# Patient Record
Sex: Female | Born: 1960 | Race: Black or African American | Hispanic: No | Marital: Married | Smoking: Never smoker
Health system: Southern US, Community
[De-identification: ages and names within clinical notes are randomized; demographics above are authoritative.]

## PROBLEM LIST (undated history)

## (undated) DIAGNOSIS — R7309 Other abnormal glucose: Secondary | ICD-10-CM

## (undated) DIAGNOSIS — Z923 Personal history of irradiation: Secondary | ICD-10-CM

## (undated) DIAGNOSIS — E669 Obesity, unspecified: Secondary | ICD-10-CM

## (undated) DIAGNOSIS — J189 Pneumonia, unspecified organism: Secondary | ICD-10-CM

## (undated) DIAGNOSIS — R7303 Prediabetes: Secondary | ICD-10-CM

## (undated) DIAGNOSIS — Z9221 Personal history of antineoplastic chemotherapy: Secondary | ICD-10-CM

## (undated) DIAGNOSIS — K219 Gastro-esophageal reflux disease without esophagitis: Secondary | ICD-10-CM

## (undated) HISTORY — DX: Gastro-esophageal reflux disease without esophagitis: K21.9

## (undated) HISTORY — DX: Obesity, unspecified: E66.9

## (undated) HISTORY — DX: Other abnormal glucose: R73.09

---

## 1997-08-30 ENCOUNTER — Other Ambulatory Visit: Admission: RE | Admit: 1997-08-30 | Discharge: 1997-08-30 | Payer: Self-pay | Admitting: Obstetrics and Gynecology

## 1998-06-12 ENCOUNTER — Encounter: Payer: Self-pay | Admitting: Internal Medicine

## 1998-06-12 ENCOUNTER — Ambulatory Visit (HOSPITAL_COMMUNITY): Admission: RE | Admit: 1998-06-12 | Discharge: 1998-06-12 | Payer: Self-pay | Admitting: Internal Medicine

## 1998-07-26 ENCOUNTER — Other Ambulatory Visit: Admission: RE | Admit: 1998-07-26 | Discharge: 1998-07-26 | Payer: Self-pay | Admitting: Obstetrics and Gynecology

## 1999-08-07 ENCOUNTER — Other Ambulatory Visit: Admission: RE | Admit: 1999-08-07 | Discharge: 1999-08-07 | Payer: Self-pay | Admitting: Obstetrics and Gynecology

## 2000-08-29 ENCOUNTER — Other Ambulatory Visit: Admission: RE | Admit: 2000-08-29 | Discharge: 2000-08-29 | Payer: Self-pay | Admitting: Obstetrics and Gynecology

## 2001-08-20 ENCOUNTER — Encounter: Admission: RE | Admit: 2001-08-20 | Discharge: 2001-08-20 | Payer: Self-pay | Admitting: Obstetrics and Gynecology

## 2001-08-20 ENCOUNTER — Encounter: Payer: Self-pay | Admitting: Obstetrics and Gynecology

## 2002-04-27 ENCOUNTER — Encounter: Admission: RE | Admit: 2002-04-27 | Discharge: 2002-04-27 | Payer: Self-pay | Admitting: Obstetrics and Gynecology

## 2002-04-27 ENCOUNTER — Encounter: Payer: Self-pay | Admitting: Obstetrics and Gynecology

## 2002-08-27 ENCOUNTER — Encounter (INDEPENDENT_AMBULATORY_CARE_PROVIDER_SITE_OTHER): Payer: Self-pay | Admitting: *Deleted

## 2002-08-27 ENCOUNTER — Ambulatory Visit (HOSPITAL_COMMUNITY): Admission: RE | Admit: 2002-08-27 | Discharge: 2002-08-27 | Payer: Self-pay | Admitting: General Surgery

## 2002-10-25 ENCOUNTER — Encounter: Admission: RE | Admit: 2002-10-25 | Discharge: 2002-10-25 | Payer: Self-pay | Admitting: Obstetrics and Gynecology

## 2002-10-25 ENCOUNTER — Encounter: Payer: Self-pay | Admitting: Obstetrics and Gynecology

## 2003-02-05 HISTORY — PX: LAPAROSCOPIC CHOLECYSTECTOMY: SUR755

## 2003-12-21 ENCOUNTER — Encounter: Admission: RE | Admit: 2003-12-21 | Discharge: 2003-12-21 | Payer: Self-pay | Admitting: Obstetrics and Gynecology

## 2004-04-10 ENCOUNTER — Ambulatory Visit: Payer: Self-pay | Admitting: Endocrinology

## 2004-04-26 ENCOUNTER — Ambulatory Visit: Payer: Self-pay | Admitting: Internal Medicine

## 2005-01-01 ENCOUNTER — Encounter: Admission: RE | Admit: 2005-01-01 | Discharge: 2005-01-01 | Payer: Self-pay | Admitting: Obstetrics and Gynecology

## 2005-06-19 ENCOUNTER — Encounter: Admission: RE | Admit: 2005-06-19 | Discharge: 2005-06-19 | Payer: Self-pay | Admitting: Obstetrics and Gynecology

## 2005-07-13 ENCOUNTER — Emergency Department (HOSPITAL_COMMUNITY): Admission: EM | Admit: 2005-07-13 | Discharge: 2005-07-13 | Payer: Self-pay | Admitting: Family Medicine

## 2005-12-09 ENCOUNTER — Emergency Department (HOSPITAL_COMMUNITY): Admission: EM | Admit: 2005-12-09 | Discharge: 2005-12-09 | Payer: Self-pay | Admitting: Family Medicine

## 2006-01-03 ENCOUNTER — Encounter: Admission: RE | Admit: 2006-01-03 | Discharge: 2006-01-03 | Payer: Self-pay | Admitting: Obstetrics and Gynecology

## 2007-01-09 ENCOUNTER — Encounter: Admission: RE | Admit: 2007-01-09 | Discharge: 2007-01-09 | Payer: Self-pay | Admitting: Obstetrics and Gynecology

## 2008-01-11 ENCOUNTER — Encounter: Admission: RE | Admit: 2008-01-11 | Discharge: 2008-01-11 | Payer: Self-pay | Admitting: Obstetrics and Gynecology

## 2009-01-13 ENCOUNTER — Encounter: Admission: RE | Admit: 2009-01-13 | Discharge: 2009-01-13 | Payer: Self-pay | Admitting: Obstetrics and Gynecology

## 2009-01-20 ENCOUNTER — Encounter: Admission: RE | Admit: 2009-01-20 | Discharge: 2009-01-20 | Payer: Self-pay | Admitting: Obstetrics and Gynecology

## 2009-01-24 LAB — CONVERTED CEMR LAB: Pap Smear: NEGATIVE

## 2009-04-18 ENCOUNTER — Ambulatory Visit: Payer: Self-pay | Admitting: Internal Medicine

## 2009-04-18 DIAGNOSIS — Z8679 Personal history of other diseases of the circulatory system: Secondary | ICD-10-CM | POA: Insufficient documentation

## 2009-04-18 DIAGNOSIS — E669 Obesity, unspecified: Secondary | ICD-10-CM | POA: Insufficient documentation

## 2009-04-18 LAB — CONVERTED CEMR LAB
ALT: 23 units/L (ref 0–35)
AST: 23 units/L (ref 0–37)
BUN: 8 mg/dL (ref 6–23)
Basophils Relative: 0.6 % (ref 0.0–3.0)
Bilirubin Urine: NEGATIVE
Calcium: 9.2 mg/dL (ref 8.4–10.5)
Creatinine, Ser: 0.7 mg/dL (ref 0.4–1.2)
Eosinophils Absolute: 0.1 10*3/uL (ref 0.0–0.7)
Eosinophils Relative: 2 % (ref 0.0–5.0)
GFR calc non Af Amer: 115.16 mL/min (ref >60)
Glucose, Bld: 112 mg/dL — ABNORMAL HIGH (ref 70–99)
HDL: 46.8 mg/dL (ref >39.00)
Monocytes Relative: 4.4 % (ref 3.0–12.0)
Neutrophils Relative %: 73.6 % (ref 43.0–77.0)
Nitrite: NEGATIVE
Platelets: 280 10*3/uL (ref 150.0–400.0)
Sodium: 138 meq/L (ref 135–145)
TSH: 1.67 microintl units/mL (ref 0.35–5.50)
Total CHOL/HDL Ratio: 3
Urine Glucose: NEGATIVE mg/dL
Urobilinogen, UA: 0.2 (ref 0.0–1.0)

## 2010-01-15 ENCOUNTER — Encounter
Admission: RE | Admit: 2010-01-15 | Discharge: 2010-01-15 | Payer: Self-pay | Source: Home / Self Care | Attending: Obstetrics and Gynecology | Admitting: Obstetrics and Gynecology

## 2010-01-15 LAB — HM MAMMOGRAPHY: HM Mammogram: NORMAL

## 2010-01-30 ENCOUNTER — Encounter: Payer: Self-pay | Admitting: Internal Medicine

## 2010-03-08 NOTE — Assessment & Plan Note (Signed)
Summary: NEW PT,UHC,#,PKG-LB   Vital Signs:  Patient profile:   50 year old female Height:      66 inches Weight:      236.75 pounds BMI:     38.35 O2 Sat:      98 % on Room air Temp:     98.5 degrees F oral Pulse rate:   100 / minute BP sitting:   122 / 72  (right arm) Cuff size:   regular  Vitals Entered By: Margaret Pyle, CMA (April 18, 2009 8:24 AM)  O2 Flow:  Room air CC: new patient   Primary Care Provider:  Newt Lukes MD  CC:  new patient.  History of Present Illness: new pt to me and our practice (prev seen > 3 yrs ago by AVP) here to est care with PCP - also patient is here today for annual physical. Patient feels well and has no complaints.   Preventive Screening-Counseling & Management  Alcohol-Tobacco     Alcohol drinks/day: <1     Alcohol type: wine     Alcohol Counseling: not indicated; use of alcohol is not excessive or problematic     Smoking Status: never     Tobacco Counseling: not indicated; no tobacco use  Caffeine-Diet-Exercise     Does Patient Exercise: yes     Depression Counseling: not indicated; screening negative for depression  Safety-Violence-Falls     Seat Belt Use: yes     Helmet Use: yes     Firearms in the Home: no firearms in the home     Smoke Detectors: yes     Violence in the Home: no risk noted     Sexual Abuse: no     Fall Risk Counseling: not indicated; no significant falls noted  Clinical Review Panels:  Prevention   Last Mammogram:  No specific mammographic evidence of malignancy.   (01/24/2009)   Last Pap Smear:  Interpretation/Result:Negative for intraepithelial Lesion or Malignancy.    (01/24/2009)  Immunizations   Last Tetanus Booster:  Historical (02/05/2004)   Current Medications (verified): 1)  One-A-Day Womens Formula  Tabs (Multiple Vitamins-Calcium) .... Once Daily 2)  Omega-3 350 Mg Caps (Omega-3 Fatty Acids) .... Once Daily 3)  Vitamins A & D 5000-400 Unit Caps (Vitamins A & D)  .... Once Daily 4)  Vitamin C 500 Mg Tabs (Ascorbic Acid) .... 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 I have reviewed all other systems and they were negative.   Physical Exam  General:  overweight-appearing.  alert, well-developed, well-nourished, and cooperative to examination.    Head:  Normocephalic and atraumatic without obvious abnormalities. No apparent alopecia or balding. Eyes:  vision grossly intact; pupils equal, round and reactive to light.  conjunctiva and lids normal.    Ears:  normal pinnae bilaterally, without erythema, swelling, or tenderness to palpation. TMs clear, without effusion, or cerumen impaction. Hearing grossly normal bilaterally  Nose:  External nasal examination shows no deformity or inflammation. Nasal mucosa are pink and moist without lesions or exudates. Mouth:  teeth and gums in good repair; mucous membranes moist, without lesions or ulcers. oropharynx clear without exudate, erythema.  Neck:  supple, full ROM, no masses, no thyromegaly; no thyroid nodules or tenderness. no JVD or carotid bruits.   Lungs:  normal respiratory effort, no intercostal retractions or use of accessory muscles; normal breath sounds bilaterally - no crackles and no wheezes.    Heart:  normal rate, regular rhythm, no murmur, and no rub. BLE without edema. normal DP pulses and normal cap refill in  all 4 extremities    Abdomen:  soft, non-tender, normal bowel sounds, no distention; no masses and no appreciable hepatomegaly or splenomegaly.   Genitalia:  defer to gyn Msk:  No deformity or scoliosis noted of thoracic or lumbar spine.   Neurologic:  alert & oriented X3 and cranial nerves II-XII symetrically intact.  strength normal in all extremities, sensation intact to light touch, and gait normal. speech fluent without dysarthria or aphasia; follows commands with good comprehension.  Skin:  no rashes, vesicles, ulcers, or erythema. No nodules or irregularity to palpation.  Psych:  Oriented X3, memory intact for recent and remote, normally interactive, good eye contact, not anxious appearing, not depressed appearing, and not agitated.      Impression & Recommendations:  Problem # 1:  PREVENTIVE HEALTH CARE (ICD-V70.0)  Patient has been counseled on age-appropriate routine health concerns for screening and prevention. These are reviewed and up-to-date. Immunizations are up-to-date or declined. Labs ordered and ECG reviewed.   Orders: TLB-Lipid Panel (80061-LIPID) TLB-BMP (Basic Metabolic Panel-BMET) (80048-METABOL) TLB-CBC Platelet - w/Differential (85025-CBCD) TLB-Hepatic/Liver Function Pnl (80076-HEPATIC) TLB-TSH (Thyroid Stimulating Hormone) (84443-TSH) T-Vitamin D (25-Hydroxy) (57846-96295) TLB-Udip w/ Micro (81001-URINE) EKG w/ Interpretation (93000)  Problem # 2:  OBESITY (ICD-278.00) counseled on exercise, diet and weight mgmt Ht: 66 (04/18/2009)   Wt: 236.75 (04/18/2009)   BMI: 38.35 (04/18/2009)  Complete Medication List: 1)  One-a-day Womens Formula Tabs (Multiple vitamins-calcium) .... Once daily 2)  Omega-3 350 Mg Caps (Omega-3 fatty acids) .... Once daily 3)  Vitamins A & D 5000-400 Unit Caps (Vitamins a & d) .... Once daily 4)  Vitamin C 500 Mg Tabs (Ascorbic acid) .... Once daily  Patient Instructions: 1)  it was good to see you today. 2)  test(s) ordered today  - your results will be posted on the phone tree for review in 48-72 hours from the time of test completion; call [phone] and enter your 9 digit MRN (listed above on this page, just below your name); if any changes need to be made or there are abnormal results, you will be contacted directly.  3)  it is important that you continue to work on losing weight - monitor your diet and consume fewer calories such as less carbohydrates (sugar) and less fat. you also need to increase your physical activity level - start by walking for 10-20 minutes 3 times per week and work up to 30 minutes 4-5 times each week. aim for being short of breath but not out of breath when you work out - 4)  Please schedule a follow-up appointment annually for medical physical and labs, sooner if problems.    Pap Smear  Procedure date:  01/24/2009  Findings:      Interpretation/Result:Negative for intraepithelial Lesion or Malignancy.     Mammogram  Procedure date:  01/24/2009  Findings:  No specific mammographic evidence of malignancy.      Immunization History:  Tetanus/Td Immunization History:    Tetanus/Td:  historical (02/05/2004)

## 2010-04-17 ENCOUNTER — Other Ambulatory Visit: Payer: 59

## 2010-04-17 ENCOUNTER — Encounter (INDEPENDENT_AMBULATORY_CARE_PROVIDER_SITE_OTHER): Payer: Self-pay | Admitting: *Deleted

## 2010-04-17 ENCOUNTER — Other Ambulatory Visit: Payer: Self-pay | Admitting: Internal Medicine

## 2010-04-17 DIAGNOSIS — Z0000 Encounter for general adult medical examination without abnormal findings: Secondary | ICD-10-CM

## 2010-04-17 LAB — BASIC METABOLIC PANEL
BUN: 9 mg/dL (ref 6–23)
Chloride: 106 mEq/L (ref 96–112)
Creatinine, Ser: 0.7 mg/dL (ref 0.4–1.2)
Glucose, Bld: 111 mg/dL — ABNORMAL HIGH (ref 70–99)

## 2010-04-17 LAB — HEPATIC FUNCTION PANEL
ALT: 24 U/L (ref 0–35)
AST: 24 U/L (ref 0–37)
Albumin: 3.9 g/dL (ref 3.5–5.2)
Alkaline Phosphatase: 43 U/L (ref 39–117)
Bilirubin, Direct: 0.1 mg/dL (ref 0.0–0.3)
Total Protein: 7 g/dL (ref 6.0–8.3)

## 2010-04-17 LAB — URINALYSIS, ROUTINE W REFLEX MICROSCOPIC
Bilirubin Urine: NEGATIVE
Ketones, ur: NEGATIVE
Specific Gravity, Urine: 1.025 (ref 1.000–1.030)
Total Protein, Urine: NEGATIVE
Urine Glucose: NEGATIVE
pH: 6 (ref 5.0–8.0)

## 2010-04-17 LAB — CBC WITH DIFFERENTIAL/PLATELET
Eosinophils Absolute: 0.1 10*3/uL (ref 0.0–0.7)
Lymphocytes Relative: 28.9 % (ref 12.0–46.0)
MCV: 84.7 fl (ref 78.0–100.0)
Monocytes Relative: 5.5 % (ref 3.0–12.0)
Neutro Abs: 3.8 10*3/uL (ref 1.4–7.7)
Neutrophils Relative %: 63.3 % (ref 43.0–77.0)
Platelets: 289 10*3/uL (ref 150.0–400.0)
WBC: 6.1 10*3/uL (ref 4.5–10.5)

## 2010-04-17 LAB — LIPID PANEL
LDL Cholesterol: 74 mg/dL (ref 0–99)
Triglycerides: 68 mg/dL (ref 0.0–149.0)

## 2010-04-23 ENCOUNTER — Encounter: Payer: Self-pay | Admitting: Internal Medicine

## 2010-04-23 ENCOUNTER — Encounter (INDEPENDENT_AMBULATORY_CARE_PROVIDER_SITE_OTHER): Payer: 59 | Admitting: Internal Medicine

## 2010-04-23 DIAGNOSIS — R7309 Other abnormal glucose: Secondary | ICD-10-CM | POA: Insufficient documentation

## 2010-04-23 DIAGNOSIS — Z0000 Encounter for general adult medical examination without abnormal findings: Secondary | ICD-10-CM

## 2010-05-03 NOTE — Assessment & Plan Note (Signed)
Summary: cpx-lb   Vital Signs:  Patient profile:   50 year old female Weight:      240.4 pounds (109.27 kg) BMI:     38.94 O2 Sat:      98 % on Room air Temp:     98.6 degrees F (37.00 degrees C) oral Pulse rate:   99 / minute BP sitting:   118 / 82  (left arm) Cuff size:   large  Vitals Entered By: Orlan Leavens RMA (April 23, 2010 9:48 AM)  O2 Flow:  Room air CC: CPX Is Patient Diabetic? No Pain Assessment Patient in pain? no        Primary Care Provider:  Newt Lukes MD  CC:  CPX.  History of Present Illness:  patient is here today for annual physical. Patient feels well and has no complaints.   Preventive Screening-Counseling & Management  Alcohol-Tobacco     Alcohol drinks/day: <1     Alcohol type: wine     Alcohol Counseling: not indicated; use of alcohol is not excessive or problematic     Smoking Status: never     Tobacco Counseling: not indicated; no tobacco use  Caffeine-Diet-Exercise     Does Patient Exercise: yes     Depression Counseling: not indicated; screening negative for depression  Safety-Violence-Falls     Seat Belt Use: yes     Helmet Use: yes     Firearms in the Home: no firearms in the home     Smoke Detectors: yes     Violence in the Home: no risk noted     Sexual Abuse: no     Fall Risk Counseling: not indicated; no significant falls noted  Clinical Review Panels:  Prevention   Last Mammogram:  No specific mammographic evidence of malignancy.   (01/15/2010)   Last Pap Smear:  Interpretation Result:Negative for intraepithelial Lesion or Malignancy. See Dr. Thornell Mule    (01/30/2010)  Immunizations   Last Tetanus Booster:  Historical (02/05/2004)  Lipid Management   Cholesterol:  124 (04/17/2010)   LDL (bad choesterol):  74 (04/17/2010)   HDL (good cholesterol):  36.50 (04/17/2010)  CBC   WBC:  6.1 (04/17/2010)   RBC:  4.52 (04/17/2010)   Hgb:  13.1 (04/17/2010)   Hct:  38.3 (04/17/2010)   Platelets:  289.0  (04/17/2010)   MCV  84.7 (04/17/2010)   MCHC  34.2 (04/17/2010)   RDW  14.2 (04/17/2010)   PMN:  63.3 (04/17/2010)   Lymphs:  28.9 (04/17/2010)   Monos:  5.5 (04/17/2010)   Eosinophils:  2.1 (04/17/2010)   Basophil:  0.2 (04/17/2010)  Complete Metabolic Panel   Glucose:  111 (04/17/2010)   Sodium:  138 (04/17/2010)   Potassium:  4.6 (04/17/2010)   Chloride:  106 (04/17/2010)   CO2:  27 (04/17/2010)   BUN:  9 (04/17/2010)   Creatinine:  0.7 (04/17/2010)   Albumin:  3.9 (04/17/2010)   Total Protein:  7.0 (04/17/2010)   Calcium:  9.4 (04/17/2010)   Total Bili:  0.4 (04/17/2010)   Alk Phos:  43 (04/17/2010)   SGPT (ALT):  24 (04/17/2010)   SGOT (AST):  24 (04/17/2010)   Current Medications (verified): 1)  One-A-Day Womens Formula  Tabs (Multiple Vitamins-Calcium) .... Once Daily 2)  Omega-3 350 Mg Caps (Omega-3 Fatty Acids) .... Once Daily 3)  Vitamin C 500 Mg Tabs (Ascorbic Acid) .... Once Daily 4)  Fish Oil 1000 Mg Caps (Omega-3 Fatty Acids) .... Take 1 By Mouth Once Daily  5)  Vitamin D3 1000 Unit Tabs (Cholecalciferol) .... Take 1 By Mouth Once Daily 6)  Zyrtec-D Allergy & Congestion 5-120 Mg Xr12h-Tab (Cetirizine-Pseudoephedrine) .... 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 I have reviewed all other systems and they were negative.   Physical Exam  General:  overweight-appearing.  alert, well-developed, well-nourished, and cooperative to  examination.    Head:  Normocephalic and atraumatic without obvious abnormalities. No apparent alopecia or balding. Eyes:  vision grossly intact; pupils equal, round and reactive to light.  conjunctiva and lids normal.    Ears:  normal pinnae bilaterally, without erythema, swelling, or tenderness to palpation. TMs clear, without effusion, or cerumen impaction. Hearing grossly normal bilaterally  Nose:  External nasal examination shows no deformity or inflammation. Nasal mucosa are pink and moist without lesions or exudates. Mouth:  teeth and gums in good repair; mucous membranes moist, without lesions or ulcers. oropharynx clear without exudate, no erythema.  Neck:  supple, full ROM, no masses, no thyromegaly; no thyroid nodules or tenderness. no JVD or carotid bruits.   Lungs:  normal respiratory effort, no intercostal retractions or use of accessory muscles; normal breath sounds bilaterally - no crackles and no wheezes.    Heart:  normal rate, regular rhythm, no murmur, and no rub. BLE without edema. normal DP pulses and normal cap refill in all 4 extremities    Abdomen:  soft, non-tender, normal bowel sounds, no distention; no masses and no appreciable hepatomegaly or splenomegaly.   Genitalia:  defer to gyn Msk:  No deformity or scoliosis noted of thoracic or lumbar spine.   Neurologic:  alert & oriented X3 and cranial nerves II-XII symetrically intact.  strength normal in all extremities, sensation intact to light touch, and gait normal. speech fluent without dysarthria or aphasia; follows commands with good comprehension.  Skin:  no rashes, vesicles, ulcers, or erythema. No nodules or irregularity to palpation.  Psych:  Oriented X3, memory intact for recent and remote, normally interactive, good eye contact, not anxious appearing, not depressed appearing, and not agitated.      Impression & Recommendations:  Problem # 1:  PREVENTIVE HEALTH CARE (ICD-V70.0) Patient has been counseled on  age-appropriate routine health concerns for screening and prevention. These are reviewed and up-to-date. Immunizations are up-to-date or declined. Labs and ECG reviewed.  Orders: EKG w/ Interpretation (93000)  Problem # 2:  HYPERGLYCEMIA (ICD-790.29) borderline, no fh dm but inactive lifestyle, slow weight gain counsleed on risk DM and need to get active, healthy food choices - will watch yearly, check a1c if >120  Complete Medication List: 1)  One-a-day Womens Formula Tabs (Multiple vitamins-calcium) .... Once daily 2)  Omega-3 350 Mg Caps (Omega-3 fatty acids) .... Once daily 3)  Vitamin C 500 Mg Tabs (Ascorbic acid) .... Once daily 4)  Fish Oil 1000 Mg Caps (Omega-3 fatty acids) .... Take 1 by mouth once daily 5)  Vitamin D3 1000 Unit Tabs (Cholecalciferol) .... Take 1 by mouth  once daily 6)  Zyrtec-d Allergy & Congestion 5-120 Mg Xr12h-tab (Cetirizine-pseudoephedrine) .... Use as needed  Patient Instructions: 1)  it was good to see you today. 2)  land reviewed today - all look good but will watch suggar levels! 3)  it is important that you work on losing weight - monitor your diet and consume fewer calories such as less carbohydrates (sugar) and less fat. you also need to increase your physical activity level - start by walking for 20 minutes 3 times per week and work up to 30 minutes 4-5 times each week. aim for being short of breath but not out of breath when you work out - 4)  tyr claritin as needed for allergy symptoms  5)  Please schedule a follow-up appointment annually for medical physical and labs, sooner if problems.    Orders Added: 1)  EKG w/ Interpretation [93000] 2)  Est. Patient 40-64 years [99396]     Mammogram  Procedure date:  01/15/2010  Findings:      No specific mammographic evidence of malignancy.    Pap Smear  Procedure date:  01/30/2010  Findings:      Interpretation Result:Negative for intraepithelial Lesion or Malignancy. See Dr. Thornell Mule

## 2010-06-22 NOTE — Op Note (Signed)
NAME:  Isabel [name], Isabel [name]                         ACCOUNT NO.:  [id number]   MEDICAL RECORD NO.:  [id number]                   PATIENT TYPE:  OIB   LOCATION:  NA                                   FACILITY:  [facility]   PHYSICIAN:  Angelia Mould. Derrell Lolling, M.D.             DATE OF BIRTH:  [birth year]   DATE OF PROCEDURE:  08/27/2002  DATE OF DISCHARGE:                                 OPERATIVE REPORT   PREOPERATIVE DIAGNOSIS:  Chronic calculous cholecystitis with  cholelithiasis.   POSTOPERATIVE DIAGNOSIS:  Chronic calculous cholecystitis with  cholelithiasis.   OPERATION PERFORMED:  Laparoscopic cholecystectomy.   SURGEON:  Angelia Mould. Derrell Lolling, M.D.   ASSISTANT:  Currie Paris, M.D.   ANESTHESIA:  General.   INDICATIONS FOR PROCEDURE:  The patient is a 50 year old black female who  has had a several year history of intermittent episodes of postprandial  bloating.  She recently had an episode of upper abdominal pain after eating  a meal, which then resolved.  She may have had other episodes.  Ultrasound  was performed showing a solitary 17 mm gallstone and a normal common bile  duct.  Liver function tests were normal.  she is brought to the operating  room electively.   OPERATIVE FINDINGS:  The gallbladder is chronically inflamed and had fairly  extensive adhesions to it throughout the infundibulum, body and fundus.  These were chronic adhesions and were taken down atraumatically.  The  gallbladder contained one large and one small gallstone.  The cystic duct  was extremely tiny and could not be cannulated despite multiple attempts and  so a cholangiogram could not be done.  The anatomy of the cystic duct,  cystic artery and common bile duct appeared normal.  The liver, stomach,  duodenum, small intestine, large intestine, and omentum were normal to  inspection.   DESCRIPTION OF PROCEDURE:  Following the induction of general endotracheal  anesthesia, the patient's abdomen was  prepped and draped in a sterile  fashion.  0.5% Marcaine with epinephrine was used as a local infiltration  anesthetic.  A vertically oriented incision was made at the lower rim of the  umbilicus.  The fascia was incised in the midline and the abdominal cavity  entered under direct vision.  A 10 mm Hasson trocar was inserted and secured  with a pursestring suture of 0 Vicryl.  Pneumoperitoneum was created.  Video  camera was inserted with visualization and findings as described above.  A  10 mm trocar was placed in the subxiphoid region.  Two 5 mm trocars were  placed in the right upper quadrant.  The gallbladder fundus was identified  and retracted.  Fairly extensive lysis of adhesions was done with sharp  scissor dissection and electrocautery and we had good visualization and the  adhesiolysis went well.  We identified the infundibulum of the gallbladder  and retracted it laterally. We dissected out the cystic  duct and the cystic  artery.  We isolated the cystic artery as it went onto the wall of the  gallbladder, secured it with multiple metal clips and divided it.  We  created a very large window behind the cystic duct for a good critical view  and we could identify the junction of the cystic duct with the infundibulum  of the gallbladder.  The cystic duct was then secured with a metal clip  close to the gallbladder.  We opened the cystic duct and found that the  lumen was extremely tiny.  We had to open it in a couple of places and it  remained tiny. We ultimately completely transected the cystic duct and then  attempted to dilate and insert a cholangiogram catheter and this was  unsuccessful.  There was a little bit of clear bile coming back from the  cystic duct.  We decided to abandon attempts at cholangiography since her  liver function tests and ultrasound suggested a normal common bile duct.  We  then secured the cystic duct stump with multiple metal clips.  The  gallbladder was  dissected from its bed with electrocautery and removed  through the umbilical port.  Hemostasis was excellent and achieved with  electrocautery.  The operative field and subphrenic space was irrigated with  saline and the irrigation fluid was completely clear.  The lateral trocars  were removed.  There was a little bit of bleeding from one of the lateral  trocar sites and this was controlled with electrocautery and observed for a  few minutes. There was absolutely no bleeding after that.  The  pneumoperitoneum was released.  All of the trocars were removed.  The fascia  at the umbilicus was closed with 0 Vicryl sutures.  The skin incisions were  closed with subcuticular sutures of 4-0 Vicryl and Steri-Strips.  Clean  bandages were placed.  The patient was then transferred to the recovery room  in stable condition.  The estimated blood loss was about 10 to 15mL.  Complications were none.  Sponge, needle and instrument counts were correct.                                                Angelia Mould. Derrell Lolling, M.D.    HMI/MEDQ  D:  08/27/2002  T:  08/27/2002  Job:  [id number]   cc:   Georgina Quint. Plotnikov, M.D. [facility]

## 2010-09-26 ENCOUNTER — Other Ambulatory Visit: Payer: Self-pay | Admitting: Obstetrics and Gynecology

## 2010-09-26 DIAGNOSIS — Z1231 Encounter for screening mammogram for malignant neoplasm of breast: Secondary | ICD-10-CM

## 2011-01-17 ENCOUNTER — Other Ambulatory Visit: Payer: Self-pay | Admitting: Obstetrics and Gynecology

## 2011-01-17 ENCOUNTER — Ambulatory Visit
Admission: RE | Admit: 2011-01-17 | Discharge: 2011-01-17 | Disposition: A | Discharge status: Home / Self Care | Payer: 59 | Source: Ambulatory Visit | Attending: Obstetrics and Gynecology | Admitting: Obstetrics and Gynecology

## 2011-01-17 DIAGNOSIS — Z1231 Encounter for screening mammogram for malignant neoplasm of breast: Secondary | ICD-10-CM

## 2011-01-17 DIAGNOSIS — N6321 Unspecified lump in the left breast, upper outer quadrant: Secondary | ICD-10-CM

## 2011-02-01 ENCOUNTER — Ambulatory Visit
Admission: RE | Admit: 2011-02-01 | Discharge: 2011-02-01 | Disposition: A | Discharge status: Home / Self Care | Payer: 59 | Source: Ambulatory Visit | Attending: Obstetrics and Gynecology | Admitting: Obstetrics and Gynecology

## 2011-02-01 DIAGNOSIS — N6321 Unspecified lump in the left breast, upper outer quadrant: Secondary | ICD-10-CM

## 2011-02-26 ENCOUNTER — Encounter: Payer: Self-pay | Admitting: Internal Medicine

## 2011-04-26 ENCOUNTER — Encounter: Payer: 59 | Admitting: Internal Medicine

## 2011-05-13 ENCOUNTER — Encounter: Payer: Self-pay | Admitting: Internal Medicine

## 2011-06-04 ENCOUNTER — Telehealth: Payer: Self-pay | Admitting: *Deleted

## 2011-06-04 ENCOUNTER — Other Ambulatory Visit (INDEPENDENT_AMBULATORY_CARE_PROVIDER_SITE_OTHER): Payer: 59

## 2011-06-04 DIAGNOSIS — Z0000 Encounter for general adult medical examination without abnormal findings: Secondary | ICD-10-CM

## 2011-06-04 LAB — TSH: TSH: 2.24 u[IU]/mL (ref 0.35–5.50)

## 2011-06-04 LAB — URINALYSIS, ROUTINE W REFLEX MICROSCOPIC
Bilirubin Urine: NEGATIVE
Leukocytes, UA: NEGATIVE
Nitrite: NEGATIVE
Specific Gravity, Urine: 1.015 (ref 1.000–1.030)
Urobilinogen, UA: 0.2 (ref 0.0–1.0)
pH: 7 (ref 5.0–8.0)

## 2011-06-04 LAB — CBC WITH DIFFERENTIAL/PLATELET
Basophils Relative: 0.6 % (ref 0.0–3.0)
Eosinophils Absolute: 0.2 10*3/uL (ref 0.0–0.7)
Eosinophils Relative: 2.1 % (ref 0.0–5.0)
HCT: 39.7 % (ref 36.0–46.0)
Hemoglobin: 13.3 g/dL (ref 12.0–15.0)
MCHC: 33.5 g/dL (ref 30.0–36.0)
MCV: 84.3 fl (ref 78.0–100.0)
Monocytes Absolute: 0.7 10*3/uL (ref 0.1–1.0)
Neutro Abs: 4.7 10*3/uL (ref 1.4–7.7)
RBC: 4.71 Mil/uL (ref 3.87–5.11)

## 2011-06-04 LAB — HEPATIC FUNCTION PANEL
ALT: 20 U/L (ref 0–35)
Albumin: 3.9 g/dL (ref 3.5–5.2)
Total Bilirubin: 0.5 mg/dL (ref 0.3–1.2)

## 2011-06-04 LAB — BASIC METABOLIC PANEL
BUN: 11 mg/dL (ref 6–23)
Creatinine, Ser: 0.7 mg/dL (ref 0.4–1.2)
GFR: 114.13 mL/min (ref >60.00)
Glucose, Bld: 114 mg/dL — ABNORMAL HIGH (ref 70–99)

## 2011-06-04 LAB — LIPID PANEL: Cholesterol: 124 mg/dL (ref 0–200)

## 2011-06-04 NOTE — Telephone Encounter (Signed)
Received call from Isabel [name] in lab. Pt is schedule for cpx and needing labs in computer.Marland KitchenMarland Kitchen4/30/13@8 :35am/LMB

## 2011-06-10 ENCOUNTER — Ambulatory Visit (INDEPENDENT_AMBULATORY_CARE_PROVIDER_SITE_OTHER): Payer: 59 | Admitting: Internal Medicine

## 2011-06-10 ENCOUNTER — Encounter: Payer: Self-pay | Admitting: Internal Medicine

## 2011-06-10 VITALS — BP 130/72 | HR 98 | Temp 98.3°F | Ht 66.0 in | Wt 236.0 lb

## 2011-06-10 DIAGNOSIS — R7309 Other abnormal glucose: Secondary | ICD-10-CM

## 2011-06-10 DIAGNOSIS — Z0000 Encounter for general adult medical examination without abnormal findings: Secondary | ICD-10-CM

## 2011-06-10 NOTE — Assessment & Plan Note (Signed)
Never with "diabetes mellitus" but remains "borderline", no FH DM Advised on glycemic index and carb counting - will continue to work on weight reduction and aerobic exercise in addition to diet

## 2011-06-10 NOTE — Patient Instructions (Signed)
It was good to see you today. We have reviewed your interval medical history including labs and tests today - everything looks good! Keep up the good work! Health Maintenance reviewed - all recommended immunizations and age-appropriate screenings are up-to-date. Call [place] after your birthday for referral for colonoscopy screening Work on lifestyle changes as discussed (low fat, low carb, increased protein diet; improved exercise efforts; weight loss) to control sugar, blood pressure and cholesterol levels and/or reduce risk of developing other medical problems. Look into [email or web address]cy or other type of food journal to assist you in this process. Medications reviewed, no changes at this time. Please schedule followup in 1 year for medical physical and labs, call sooner if problems.   Basic Carbohydrate Counting Basic carbohydrate counting is a way to plan meals. It is done by counting the amount of carbohydrate in foods. Eating carbohydrates increases blood glucose (sugar) levels. People with diabetes use carbohydrate counting to help keep their blood glucose at a normal level.   Foods that have carbohydrates are starches (grains, beans, starchy vegetables) and sweets.   COUNTING CARBOHYDRATES IN FOODS The first step in counting carbohydrates is to learn how many carbohydrate servings you should have in every meal. A dietitian can plan this for you. After learning the amount of carbohydrates to include in your meal plan, you can start to choose the carbohydrate-containing foods you want to eat.   There are 2 ways to identify the amount of carbohydrates in the foods you eat.  Read the Nutrition Facts panel on food labels. All you need are 2 pieces of information from the Nutrition Facts panel to count carbohydrates this way:   Serving size.   Total carbohydrate (in grams).  Decide how many servings you will be eating. If it is 1 serving, you will be eating the amount of carbohydrate listed on the  panel. If you will be eating 2 servings, you will be eating double the amount of carbohydrate listed on the panel.    Learn serving sizes. A serving size of most carbohydrate-containing foods is about 15 g. Listed below are serving sizes of common carbohydrate-containing foods:   1 slice bread.    cup unsweetened, dry cereal.    cup hot cereal.   ? cup rice.    cup mashed potatoes.   ? cup pasta.   1 cup fresh fruit.    cup canned fruit.   1 cup milk (whole, 2%, or skim).    cup starchy vegetables (peas, corn, or potatoes).  Counting carbohydrates this way is similar to looking on the Nutrition Facts panel. Decide how many servings you will eat first. Multiply the number of servings you eat by 15 g. For example, if you have 2 cups of strawberries, you had 2 servings. That means you had 30 g of carbohydrate (2 servings x 15 g = 30 g). CALCULATING CARBOHYDRATES IN A MEAL Sample dinner  3 oz chicken breast.   ? cup brown rice.    cup corn.   1 cup fat-free milk.   1 cup strawberries with sugar-free whipped topping.  Carbohydrate calculation First, identify the foods that contain carbohydrate:  Rice.   Corn.   Milk.   Strawberries.  Calculate the number of servings eaten:  2 servings rice.   1 serving corn.   1 serving milk.   1 serving strawberries.  Multiply the number of servings by 15 g:  2 servings rice x 15 g = 30 g.   1 serving  corn x 15 g = 15 g.   1 serving milk x 15 g = 15 g.   1 serving strawberries x 15 g = 15 g.  Add the amounts to find the total carbohydrates eaten: 30 g + 15 g + 15 g + 15 g = 75 g carbohydrate eaten at dinner. Document Released: 01/21/2005 Document Revised: 01/10/2011 Document Reviewed: 12/07/2010 [facility] Patient Information 2012 [place], [place].

## 2011-06-10 NOTE — Progress Notes (Signed)
Subjective:    Patient ID: Isabel [name], female    DOB: Jan 31, 1961, 51 y.o.   MRN: [id number]  HPI patient is here today for annual physical. Patient feels well and has no complaints.  Past Medical History  Diagnosis Date  . HYPERGLYCEMIA   . OBESITY    Family History  Problem Relation Age of Onset  . Prostate cancer Father   . Hypertension Brother   . Kidney disease Brother   . Stroke Maternal Grandmother    History  Substance Use Topics  . Smoking status: Never Smoker   . Smokeless tobacco: Not on file   Comment: Married, lives with spouse (and occ grown son [employer]). works at [employer]  . Alcohol Use:      Review of Systems Constitutional: Negative for fever or weight change.  Respiratory: Negative for cough and shortness of breath.   Cardiovascular: Negative for chest pain or palpitations.  Gastrointestinal: Negative for abdominal pain, no bowel changes.  Musculoskeletal: Negative for gait problem or joint swelling.  Skin: Negative for rash.  Neurological: Negative for dizziness or headache.  No other specific complaints in a complete review of systems (except as listed in HPI above).     Objective:   Physical Exam BP 130/72  Pulse 98  Temp(Src) 98.3 F (36.8 C) (Oral)  Ht 5\' 6"  (1.676 m)  Wt 236 lb (107.049 kg)  BMI 38.09 kg/m2  SpO2 98% Wt Readings from Last 3 Encounters:  06/10/11 236 lb (107.049 kg)  04/23/10 240 lb 6.4 oz (109.045 kg)  04/18/09 236 lb 12 oz (107.389 kg)   Constitutional: She is overweight, but appears well-developed and well-nourished. No distress.  HENT: Head: Normocephalic and atraumatic. Ears: B TMs ok, no erythema or effusion; Nose: Nose normal. Mouth/Throat: Oropharynx is clear and moist. No oropharyngeal exudate.  Eyes: Conjunctivae and EOM are normal. Pupils are equal, round, and reactive to light. No scleral icterus.  Neck: Normal range of motion. Neck supple. No JVD present. No thyromegaly  present.  Cardiovascular: Normal rate, regular rhythm and normal heart sounds.  No murmur heard. No BLE edema. Pulmonary/Chest: Effort normal and breath sounds normal. No respiratory distress. She has no wheezes.  Abdominal: Soft. Bowel sounds are normal. She exhibits no distension. There is no tenderness. no masses Musculoskeletal: Normal range of motion, no joint effusions. No gross deformities Neurological: She is alert and oriented to person, place, and time. No cranial nerve deficit. Coordination normal.  Skin: Skin is warm and dry. No rash noted. No erythema.  Psychiatric: She has a normal mood and affect. Her behavior is normal. Judgment and thought content normal.   Lab Results  Component Value Date   WBC 7.6 06/04/2011   HGB 13.3 06/04/2011   HCT 39.7 06/04/2011   PLT 281.0 06/04/2011   GLUCOSE 114* 06/04/2011   CHOL 124 06/04/2011   TRIG 91.0 06/04/2011   HDL 41.60 06/04/2011   LDLCALC 64 06/04/2011   ALT 20 06/04/2011   AST 23 06/04/2011   NA 137 06/04/2011   K 4.3 06/04/2011   CL 105 06/04/2011   CREATININE 0.7 06/04/2011   BUN 11 06/04/2011   CO2 26 06/04/2011   TSH 2.24 06/04/2011   EKG: NSR @ 98 - no ischemic changes or arrhythmias      Assessment & Plan:  CPX/v70.0 - Patient has been counseled on age-appropriate routine health concerns for screening and prevention. These are reviewed and up-to-date. Immunizations are up-to-date or declined. Labs  and ECG reviewed.

## 2011-09-11 ENCOUNTER — Ambulatory Visit: Payer: 59

## 2011-09-11 ENCOUNTER — Ambulatory Visit (INDEPENDENT_AMBULATORY_CARE_PROVIDER_SITE_OTHER): Payer: 59 | Admitting: Internal Medicine

## 2011-09-11 VITALS — BP 132/78 | HR 97 | Temp 98.0°F | Resp 16 | Ht 65.5 in | Wt 240.2 lb

## 2011-09-11 DIAGNOSIS — R079 Chest pain, unspecified: Secondary | ICD-10-CM

## 2011-09-11 DIAGNOSIS — K2100 Gastro-esophageal reflux disease with esophagitis, without bleeding: Secondary | ICD-10-CM | POA: Insufficient documentation

## 2011-09-11 MED ORDER — OMEPRAZOLE 20 MG PO CPDR: 20.0000 mg | DELAYED_RELEASE_CAPSULE | Freq: Every day | ORAL | Status: DC

## 2011-09-11 MED ORDER — GI COCKTAIL ~~LOC~~
30.0000 mL | Freq: Once | ORAL | Status: AC
  Administered 2011-09-11: 30 mL via ORAL

## 2011-09-11 NOTE — Progress Notes (Signed)
  Subjective:    Patient ID: Isabel [name], female    DOB: Oct 31, 1960, 51 y.o.   MRN: [id number]  HPI Abdomen and upper chest discomfort Moderate in severity comes and goes Belching but feels hungry 2 days ago onset after eating pimento cheese and hotdogs. No choking No difficultly swallowing Pain is increased with deep breath and movement No nausea or vomiting No shortness of breath and no diaphoresis   Review of Systems  Constitutional: Negative.   HENT: Negative.   Eyes: Negative.   Respiratory: Negative.   Cardiovascular: Positive for chest pain.  Gastrointestinal: Negative.   Genitourinary: Negative.   Musculoskeletal: Negative.   Skin: Negative.   Neurological: Negative.   Hematological: Negative.   Psychiatric/Behavioral: Negative.   All other systems reviewed and are negative.       Objective:   Physical Exam  Nursing note and vitals reviewed. Constitutional: She is oriented to person, place, and time. She appears well-developed.  HENT:  Head: Normocephalic and atraumatic.  Right Ear: External ear normal.  Left Ear: External ear normal.  Mouth/Throat: Oropharynx is clear and moist.  Eyes: Conjunctivae and EOM are normal. Pupils are equal, round, and reactive to light.  Neck: Normal range of motion. Neck supple.  Cardiovascular: Normal rate, regular rhythm, normal heart sounds and intact distal pulses.   Pulmonary/Chest: Effort normal and breath sounds normal.  Abdominal: Soft. Bowel sounds are normal.  Musculoskeletal: Normal range of motion.  Neurological: She is alert and oriented to person, place, and time.  Skin: Skin is warm and dry.  Psychiatric: She has a normal mood and affect. Her behavior is normal. Judgment and thought content normal.      [facility] reading (PRIMARY) by  Dr. Mindi Junker negitive chest xray ekg nsr no acute changes interval qrs qt normal    Assessment & Plan:  chest pain Suspect esophagitis  Will eval ekg since pt has a hx of  hyperlipidemia Also will check chest xray

## 2011-09-11 NOTE — Patient Instructions (Addendum)
Take prilosec as directed. No alcohol no caffinated beverages. If your symptoms worsen return to the office or to the er.

## 2011-11-27 ENCOUNTER — Encounter: Payer: Self-pay | Admitting: Internal Medicine

## 2012-01-06 ENCOUNTER — Encounter: Payer: Self-pay | Admitting: Internal Medicine

## 2012-01-06 ENCOUNTER — Ambulatory Visit (AMBULATORY_SURGERY_CENTER): Payer: 59 | Admitting: *Deleted

## 2012-01-06 VITALS — Ht 66.5 in | Wt 245.0 lb

## 2012-01-06 DIAGNOSIS — Z1211 Encounter for screening for malignant neoplasm of colon: Secondary | ICD-10-CM

## 2012-01-06 MED ORDER — MOVIPREP 100 G PO SOLR: ORAL | Status: DC

## 2012-01-16 ENCOUNTER — Ambulatory Visit (AMBULATORY_SURGERY_CENTER): Payer: 59 | Admitting: Internal Medicine

## 2012-01-16 ENCOUNTER — Encounter: Payer: Self-pay | Admitting: Internal Medicine

## 2012-01-16 VITALS — BP 165/82 | HR 84 | Temp 97.8°F | Resp 21 | Ht 66.0 in | Wt 245.0 lb

## 2012-01-16 DIAGNOSIS — D126 Benign neoplasm of colon, unspecified: Secondary | ICD-10-CM

## 2012-01-16 DIAGNOSIS — Z1211 Encounter for screening for malignant neoplasm of colon: Secondary | ICD-10-CM

## 2012-01-16 DIAGNOSIS — K635 Polyp of colon: Secondary | ICD-10-CM

## 2012-01-16 MED ORDER — SODIUM CHLORIDE 0.9 % IV SOLN: 500.0000 mL | INTRAVENOUS | Status: DC

## 2012-01-16 NOTE — Op Note (Signed)
[facility] [address] [place], [address]   COLONOSCOPY PROCEDURE REPORT  PATIENT: [name], Isabel Mies  MR#: [id number] BIRTHDATE: 03-15-1960 , 51  yrs. old GENDER: Female ENDOSCOPIST: Beverley Fiedler, MD REFERRED XB:[id number], Valerie PROCEDURE DATE:  01/16/2012 PROCEDURE:   Colonoscopy with snare polypectomy ASA CLASS:   Class II INDICATIONS:average risk screening and first colonoscopy. MEDICATIONS: MAC sedation, administered by CRNA and propofol (Diprivan) 450mg  IV  DESCRIPTION OF PROCEDURE:   After the risks benefits and alternatives of the procedure were thoroughly explained, informed consent was obtained.  A digital rectal exam revealed no rectal mass.   The LB [id number]-[id number]  endoscope was introduced through the anus and advanced to the cecum, which was identified by both the appendix and ileocecal valve. No adverse events experienced. The quality of the prep was Moviprep fair  The instrument was then slowly withdrawn as the colon was fully examined.    COLON FINDINGS: Three sessile polyps measuring 2-5 mm in size were found in the distal sigmoid colon and rectum.  Polypectomy was performed using cold snare.  All resections were complete and all polyp tissue was completely retrieved.   The colon mucosa was otherwise normal.  Retroflexed views revealed no abnormalities. The time to cecum=12 minutes 02 seconds.  Withdrawal time=12 minutes 19 seconds.  The scope was withdrawn and the procedure completed. COMPLICATIONS: There were no complications.  ENDOSCOPIC IMPRESSION: 1.   Three sessile polyps measuring 2-5 mm in size were found in the distal sigmoid colon and rectum; Polypectomy was performed using cold snare 2.   The colon mucosa was otherwise normal  RECOMMENDATIONS: 1.  Await pathology results 2.  Timing of repeat colonoscopy will be determined by pathology findings. 3.  You will receive a letter within 1-2 weeks with the results of your  biopsy as well as final recommendations.  Please call my office if you have not received a letter after 3 weeks.   eSigned:  Beverley Fiedler, MD 01/16/2012 9:08 AM   cc: Newt Lukes, MD and The Patient

## 2012-01-16 NOTE — Progress Notes (Addendum)
Patient did not have preoperative order for IV antibiotic SSI prophylaxis. (G8918)  Patient did not experience any of the following events: a burn prior to discharge; a fall within the facility; wrong site/side/patient/procedure/implant event; or a hospital transfer or hospital admission upon discharge from the facility. (G8907)  

## 2012-01-16 NOTE — Patient Instructions (Addendum)
YOU HAD AN ENDOSCOPIC PROCEDURE TODAY AT THE [facility]: Refer to the procedure report that was given to you for any specific questions about what was found during the examination.  If the procedure report does not answer your questions, please call your gastroenterologist to clarify.  If you requested that your care partner not be given the details of your procedure findings, then the procedure report has been included in a sealed envelope for you to review at your convenience later.  YOU SHOULD EXPECT: Some feelings of bloating in the abdomen. Passage of more gas than usual.  Walking can help get rid of the air that was put into your GI tract during the procedure and reduce the bloating. If you had a lower endoscopy (such as a colonoscopy or flexible sigmoidoscopy) you may notice spotting of blood in your stool or on the toilet paper. If you underwent a bowel prep for your procedure, then you may not have a normal bowel movement for a few days.  DIET: Your first meal following the procedure should be a light meal and then it is ok to progress to your normal diet.  A half-sandwich or bowl of soup is an example of a good first meal.  Heavy or fried foods are harder to digest and may make you feel nauseous or bloated.  Likewise meals heavy in dairy and vegetables can cause extra gas to form and this can also increase the bloating.  Drink plenty of fluids but you should avoid alcoholic beverages for 24 hours.  ACTIVITY: Your care partner should take you home directly after the procedure.  You should plan to take it easy, moving slowly for the rest of the day.  You can resume normal activity the day after the procedure however you should NOT DRIVE or use heavy machinery for 24 hours (because of the sedation medicines used during the test).    SYMPTOMS TO REPORT IMMEDIATELY: A gastroenterologist can be reached at any hour.  During normal business hours, 8:30 AM to 5:00 PM Monday through Friday,  call [phone].  After hours and on weekends, please call the GI answering service at [phone] who will take a message and have the physician on call contact you.   Following lower endoscopy (colonoscopy or flexible sigmoidoscopy):  Excessive amounts of blood in the stool  Significant tenderness or worsening of abdominal pains  Swelling of the abdomen that is new, acute  Fever of 100F or higher  FOLLOW UP: If any biopsies were taken you will be contacted by phone or by letter within the next 1-3 weeks.  Call your gastroenterologist if you have not heard about the biopsies in 3 weeks.  Our staff will call the home number listed on your records the next business day following your procedure to check on you and address any questions or concerns that you may have at that time regarding the information given to you following your procedure. This is a courtesy call and so if there is no answer at the home number and we have not heard from you through the emergency physician on call, we will assume that you have returned to your regular daily activities without incident.  SIGNATURES/CONFIDENTIALITY: You and/or your care partner have signed paperwork which will be entered into your electronic medical record.  These signatures attest to the fact that that the information above on your After Visit Summary has been reviewed and is understood.  Full responsibility of the confidentiality of this   discharge information lies with you and/or your care-partner.   Thank-you for choosing us for your healthcare needs. 

## 2012-01-17 ENCOUNTER — Telehealth: Payer: Self-pay | Admitting: *Deleted

## 2012-01-17 NOTE — Telephone Encounter (Signed)
  Follow up Call-  Call back number 01/16/2012  Post procedure Call Back phone  # [phone]  Permission to leave phone message Yes     Patient questions:  Do you have a fever, pain , or abdominal swelling? no Pain Score  0 *  Have you tolerated food without any problems? yes  Have you been able to return to your normal activities? yes  Do you have any questions about your discharge instructions: Diet   no Medications  no Follow up visit  no  Do you have questions or concerns about your Care? no  Actions: * If pain score is 4 or above: No action needed, pain <4.

## 2012-01-22 ENCOUNTER — Encounter: Payer: Self-pay | Admitting: Internal Medicine

## 2012-02-10 ENCOUNTER — Telehealth: Payer: Self-pay | Admitting: *Deleted

## 2012-02-10 DIAGNOSIS — Z0000 Encounter for general adult medical examination without abnormal findings: Secondary | ICD-10-CM

## 2012-02-10 NOTE — Telephone Encounter (Signed)
Message copied by Deatra James on Mon Feb 10, 2012  9:50 AM ------      Message from: Etheleen Sia      Created: Mon Feb 10, 2012  9:39 AM      Regarding: LABS       PHYSICAL LABS FOR PHYSICAL Isabel [name]

## 2012-02-10 NOTE — Telephone Encounter (Signed)
Received staff msg needing cpx labs entered...lmb

## 2012-06-05 ENCOUNTER — Other Ambulatory Visit (INDEPENDENT_AMBULATORY_CARE_PROVIDER_SITE_OTHER): Payer: 59

## 2012-06-05 DIAGNOSIS — Z0000 Encounter for general adult medical examination without abnormal findings: Secondary | ICD-10-CM

## 2012-06-05 LAB — CBC WITH DIFFERENTIAL/PLATELET
Basophils Absolute: 0 10*3/uL (ref 0.0–0.1)
HCT: 41.1 % (ref 36.0–46.0)
Lymphs Abs: 1.8 10*3/uL (ref 0.7–4.0)
Monocytes Absolute: 0.6 10*3/uL (ref 0.1–1.0)
Monocytes Relative: 7.6 % (ref 3.0–12.0)
Platelets: 291 10*3/uL (ref 150.0–400.0)
RDW: 14 % (ref 11.5–14.6)

## 2012-06-05 LAB — URINALYSIS, ROUTINE W REFLEX MICROSCOPIC
Bilirubin Urine: NEGATIVE
Leukocytes, UA: NEGATIVE
Nitrite: NEGATIVE
Specific Gravity, Urine: 1.02 (ref 1.000–1.030)
pH: 7 (ref 5.0–8.0)

## 2012-06-05 LAB — TSH: TSH: 1.67 u[IU]/mL (ref 0.35–5.50)

## 2012-06-08 LAB — LIPID PANEL
Cholesterol: 146 mg/dL (ref 0–200)
HDL: 41.4 mg/dL
LDL Cholesterol: 86 mg/dL (ref 0–99)
Total CHOL/HDL Ratio: 4
Triglycerides: 95 mg/dL (ref 0.0–149.0)
VLDL: 19 mg/dL (ref 0.0–40.0)

## 2012-06-08 LAB — BASIC METABOLIC PANEL
GFR: 79.91 mL/min (ref >60.00)
Potassium: 4.1 mEq/L (ref 3.5–5.1)
Sodium: 135 mEq/L (ref 135–145)

## 2012-06-08 LAB — HEPATIC FUNCTION PANEL
AST: 30 U/L (ref 0–37)
Albumin: 4.9 g/dL (ref 3.5–5.2)
Alkaline Phosphatase: 50 U/L (ref 39–117)

## 2012-06-10 ENCOUNTER — Encounter: Payer: 59 | Admitting: Internal Medicine

## 2012-06-11 ENCOUNTER — Ambulatory Visit (INDEPENDENT_AMBULATORY_CARE_PROVIDER_SITE_OTHER): Payer: 59 | Admitting: Internal Medicine

## 2012-06-11 ENCOUNTER — Encounter: Payer: Self-pay | Admitting: Internal Medicine

## 2012-06-11 VITALS — BP 120/82 | HR 95 | Temp 98.6°F | Ht 65.5 in | Wt 242.0 lb

## 2012-06-11 DIAGNOSIS — R7309 Other abnormal glucose: Secondary | ICD-10-CM

## 2012-06-11 DIAGNOSIS — K219 Gastro-esophageal reflux disease without esophagitis: Secondary | ICD-10-CM

## 2012-06-11 DIAGNOSIS — E669 Obesity, unspecified: Secondary | ICD-10-CM

## 2012-06-11 DIAGNOSIS — Z0000 Encounter for general adult medical examination without abnormal findings: Secondary | ICD-10-CM

## 2012-06-11 NOTE — Assessment & Plan Note (Signed)
Wt Readings from Last 3 Encounters:  06/11/12 242 lb (109.77 kg)  01/16/12 245 lb (111.131 kg)  01/06/12 245 lb (111.131 kg)   Weight trends reviewed The patient is asked to make an attempt to improve diet and exercise patterns to aid in medical management of this problem. Return visit in 6 months to recheck weight and monitor A1c

## 2012-06-11 NOTE — Progress Notes (Signed)
Subjective:    Patient ID: Isabel [name], female    DOB: Nov 27, 1960, 52 y.o.   MRN: [id number]  HPI  Patient presents to the office today for a CPE.  Patient states that she is doing well overall.  Interval history and Family History were reviewed.  Past Medical History  Diagnosis Date  . HYPERGLYCEMIA   . OBESITY   . GERD (gastroesophageal reflux disease)       Family History  Problem Relation Age of Onset  . Prostate cancer Father   . Hypertension Brother   . Kidney disease Brother   . Stroke Maternal Grandmother   . Colon cancer Neg Hx   . Stomach cancer Neg Hx      Review of Systems  Constitutional: Negative for fever, chills and fatigue.  Respiratory: Negative for chest tightness and shortness of breath.   Cardiovascular: Negative for chest pain and palpitations.  Gastrointestinal: Negative for nausea, vomiting, abdominal pain and diarrhea.  Endocrine: Negative for polydipsia, polyphagia and polyuria.  Genitourinary: Negative for dysuria, urgency, frequency, hematuria and flank pain.  Musculoskeletal: Negative for myalgias, joint swelling and arthralgias.  All other systems reviewed and are negative.       Objective:   Physical Exam  Nursing note and vitals reviewed. Constitutional: She is oriented to person, place, and time. She appears well-developed and well-nourished. No distress.  HENT:  Head: Normocephalic and atraumatic.  Mouth/Throat: No oropharyngeal exudate.  Eyes: Conjunctivae are normal. No scleral icterus.  Neck: Normal range of motion. Neck supple. No thyromegaly present.  Cardiovascular: Normal rate, regular rhythm and normal heart sounds.  Exam reveals no gallop and no friction rub.   No murmur heard. Pulmonary/Chest: Effort normal and breath sounds normal. No respiratory distress. She has no wheezes. She has no rales. She exhibits no tenderness.  Musculoskeletal: Normal range of motion.  Lymphadenopathy:    She has no cervical  adenopathy.  Neurological: She is alert and oriented to person, place, and time.  Skin: Skin is warm and dry.  Psychiatric: She has a normal mood and affect. Her behavior is normal.   Wt Readings from Last 3 Encounters:  06/11/12 242 lb (109.77 kg)  01/16/12 245 lb (111.131 kg)  01/06/12 245 lb (111.131 kg)   BP Readings from Last 3 Encounters:  06/11/12 120/82  01/16/12 165/82  09/11/11 132/78   Lab Results  Component Value Date   WBC 7.3 06/05/2012   HGB 13.8 06/05/2012   HCT 41.1 06/05/2012   PLT 291.0 06/05/2012   GLUCOSE 117* 06/05/2012   CHOL 146 06/05/2012   TRIG 95.0 06/05/2012   HDL 41.40 06/05/2012   LDLCALC 86 06/05/2012   ALT 22 06/05/2012   AST 30 06/05/2012   NA 135 06/05/2012   K 4.1 06/05/2012   CL 102 06/05/2012   CREATININE 1.0 06/05/2012   BUN 14 06/05/2012   CO2 27 06/05/2012   TSH 1.67 06/05/2012        Assessment & Plan:  Patient presents to the office for CPE.  All age appropriate screenings were discussed and are up to date.  All immunizations are currently up to date.  Labs and ECG were discussed with the patient.    Patient was counseled on exercise and diet tips for weight loss.  Patient was instructed to attempt to lose no more than a pound per week.    Patient to return in 6 months for a A1C, and an appointment to check on her weight progress.  I have personally reviewed this case with PA student. I also personally examined this patient. I agree with history and findings as documented above. I reviewed, discussed and approve of the assessment and plan as listed above. Gwendolyn Grant, MD

## 2012-06-11 NOTE — Assessment & Plan Note (Signed)
Never with "diabetes mellitus" but remains "borderline", no FH DM Advised on glycemic index and carb counting - will continue to work on weight reduction and aerobic exercise in addition to diet ROV in 6 months to check a1c and weight - pt agrees to same

## 2012-06-11 NOTE — Patient Instructions (Addendum)
It was good to see you today. We have reviewed your interval medical history including labs and tests today - everything looks good! Keep up the good work! Health Maintenance reviewed - all recommended immunizations and age-appropriate screenings are up-to-date. Call [place] after your birthday for referral for colonoscopy screening Continue to work on lifestyle changes as discussed (low carb, increased protein diet; improved exercise efforts; weight loss) to control sugar, blood pressure and cholesterol levels and/or reduce risk of developing other medical problems. Look into [email or web address] or other type of food journal to assist you in this process. Medications reviewed, no changes at this time. Please schedule followup in 6 months for sugar check and weight check - stop in at lab day before Please schedule followup in 1 year for medical physical and labs, call sooner if problems.  Health Maintenance, Females A healthy lifestyle and preventative care can promote health and wellness.  Maintain regular health, dental, and eye exams.  Eat a healthy diet. Foods like vegetables, fruits, whole grains, low-fat dairy products, and lean protein foods contain the nutrients you need without too many calories. Decrease your intake of foods high in solid fats, added sugars, and salt. Get information about a proper diet from your caregiver, if necessary.  Regular physical exercise is one of the most important things you can do for your health. Most adults should get at least 150 minutes of moderate-intensity exercise (any activity that increases your heart rate and causes you to sweat) each week. In addition, most adults need muscle-strengthening exercises on 2 or more days a week.   Maintain a healthy weight. The body mass index (BMI) is a screening tool to identify possible weight problems. It provides an estimate of body fat based on height and weight. Your caregiver can help determine your BMI, and can help  you achieve or maintain a healthy weight. For adults 20 years and older:  A BMI below 18.5 is considered underweight.  A BMI of 18.5 to 24.9 is normal.  A BMI of 25 to 29.9 is considered overweight.  A BMI of 30 and above is considered obese.  Maintain normal blood lipids and cholesterol by exercising and minimizing your intake of saturated fat. Eat a balanced diet with plenty of fruits and vegetables. Blood tests for lipids and cholesterol should begin at age 61 and be repeated every 5 years. If your lipid or cholesterol levels are high, you are over 50, or you are a high risk for heart disease, you may need your cholesterol levels checked more frequently.Ongoing high lipid and cholesterol levels should be treated with medicines if diet and exercise are not effective.  If you smoke, find out from your caregiver how to quit. If you do not use tobacco, do not start.  If you are pregnant, do not drink alcohol. If you are breastfeeding, be very cautious about drinking alcohol. If you are not pregnant and choose to drink alcohol, do not exceed 1 drink per day. One drink is considered to be 12 ounces (355 mL) of beer, 5 ounces (148 mL) of wine, or 1.5 ounces (44 mL) of liquor.  Avoid use of street drugs. Do not share needles with anyone. Ask for help if you need support or instructions about stopping the use of drugs.  High blood pressure causes heart disease and increases the risk of stroke. Blood pressure should be checked at least every 1 to 2 years. Ongoing high blood pressure should be treated with medicines, if weight loss and  exercise are not effective.  If you are 25 to 51 years old, ask your caregiver if you should take aspirin to prevent strokes.  Diabetes screening involves taking a blood sample to check your fasting blood sugar level. This should be done once every 3 years, after age 24, if you are within normal weight and without risk factors for diabetes. Testing should be considered  at a younger age or be carried out more frequently if you are overweight and have at least 1 risk factor for diabetes.  Breast cancer screening is essential preventative care for women. You should practice "breast self-awareness." This means understanding the normal appearance and feel of your breasts and may include breast self-examination. Any changes detected, no matter how small, should be reported to a caregiver. Women in their 64s and 30s should have a clinical breast exam (CBE) by a caregiver as part of a regular health exam every 1 to 3 years. After age 56, women should have a CBE every year. Starting at age 39, women should consider having a mammogram (breast X-ray) every year. Women who have a family history of breast cancer should talk to their caregiver about genetic screening. Women at a high risk of breast cancer should talk to their caregiver about having an MRI and a mammogram every year.  The Pap test is a screening test for cervical cancer. Women should have a Pap test starting at age 60. Between ages 71 and 65, Pap tests should be repeated every 2 years. Beginning at age 57, you should have a Pap test every 3 years as long as the past 3 Pap tests have been normal. If you had a hysterectomy for a problem that was not cancer or a condition that could lead to cancer, then you no longer need Pap tests. If you are between ages 50 and 83, and you have had normal Pap tests going back 10 years, you no longer need Pap tests. If you have had past treatment for cervical cancer or a condition that could lead to cancer, you need Pap tests and screening for cancer for at least 20 years after your treatment. If Pap tests have been discontinued, risk factors (such as a new sexual partner) need to be reassessed to determine if screening should be resumed. Some women have medical problems that increase the chance of getting cervical cancer. In these cases, your caregiver may recommend more frequent screening  and Pap tests.  The human papillomavirus (HPV) test is an additional test that may be used for cervical cancer screening. The HPV test looks for the virus that can cause the cell changes on the cervix. The cells collected during the Pap test can be tested for HPV. The HPV test could be used to screen women aged 46 years and older, and should be used in women of any age who have unclear Pap test results. After the age of 43, women should have HPV testing at the same frequency as a Pap test.  Colorectal cancer can be detected and often prevented. Most routine colorectal cancer screening begins at the age of 53 and continues through age 2. However, your caregiver may recommend screening at an earlier age if you have risk factors for colon cancer. On a yearly basis, your caregiver may provide home test kits to check for hidden blood in the stool. Use of a small camera at the end of a tube, to directly examine the colon (sigmoidoscopy or colonoscopy), can detect the earliest forms of  colorectal cancer. Talk to your caregiver about this at age 58, when routine screening begins. Direct examination of the colon should be repeated every 5 to 10 years through age 79, unless early forms of pre-cancerous polyps or small growths are found.  Hepatitis C blood testing is recommended for all people born from [birth year] through 1964 and any individual with known risks for hepatitis C.  Practice safe sex. Use condoms and avoid high-risk sexual practices to reduce the spread of sexually transmitted infections (STIs). Sexually active women aged 31 and younger should be checked for Chlamydia, which is a common sexually transmitted infection. Older women with new or multiple partners should also be tested for Chlamydia. Testing for other STIs is recommended if you are sexually active and at increased risk.  Osteoporosis is a disease in which the bones lose minerals and strength with aging. This can result in serious bone fractures.  The risk of osteoporosis can be identified using a bone density scan. Women ages 9 and over and women at risk for fractures or osteoporosis should discuss screening with their caregivers. Ask your caregiver whether you should be taking a calcium supplement or vitamin D to reduce the rate of osteoporosis.  Menopause can be associated with physical symptoms and risks. Hormone replacement therapy is available to decrease symptoms and risks. You should talk to your caregiver about whether hormone replacement therapy is right for you.  Use sunscreen with a sun protection factor (SPF) of 30 or greater. Apply sunscreen liberally and repeatedly throughout the day. You should seek shade when your shadow is shorter than you. Protect yourself by wearing long sleeves, pants, a wide-brimmed hat, and sunglasses year round, whenever you are outdoors.  Notify your caregiver of new moles or changes in moles, especially if there is a change in shape or color. Also notify your caregiver if a mole is larger than the size of a pencil eraser.  Stay current with your immunizations. Document Released: 08/06/2010 Document Revised: 04/15/2011 Document Reviewed: 08/06/2010 [facility] Patient Information 2013 [place], [place]. Exercise to Lose Weight Exercise and a healthy diet may help you lose weight. Your doctor may suggest specific exercises. EXERCISE IDEAS AND TIPS  Choose low-cost things you enjoy doing, such as walking, bicycling, or exercising to workout videos.  Take stairs instead of the elevator.  Walk during your lunch break.  Park your car further away from work or school.  Go to a gym or an exercise class.  Start with 5 to 10 minutes of exercise each day. Build up to 30 minutes of exercise 4 to 6 days a week.  Wear shoes with good support and comfortable clothes.  Stretch before and after working out.  Work out until you breathe harder and your heart beats faster.  Drink extra water when you  exercise.  Do not do so much that you hurt yourself, feel dizzy, or get very short of breath. Exercises that burn about 150 calories:  Running 1  miles in 15 minutes.  Playing volleyball for 45 to 60 minutes.  Washing and waxing a car for 45 to 60 minutes.  Playing touch football for 45 minutes.  Walking 1  miles in 35 minutes.  Pushing a stroller 1  miles in 30 minutes.  Playing basketball for 30 minutes.  Raking leaves for 30 minutes.  Bicycling 5 miles in 30 minutes.  Walking 2 miles in 30 minutes.  Dancing for 30 minutes.  Shoveling snow for 15 minutes.  Swimming laps for 20  minutes.  Walking up stairs for 15 minutes.  Bicycling 4 miles in 15 minutes.  Gardening for 30 to 45 minutes.  Jumping rope for 15 minutes.  Washing windows or floors for 45 to 60 minutes. Document Released: 02/23/2010 Document Revised: 04/15/2011 Document Reviewed: 02/23/2010 [facility] Patient Information 2013 [place], [place].

## 2012-06-11 NOTE — Progress Notes (Signed)
Subjective:    Patient ID: Isabel [name], female    DOB: 08/07/1960, 52 y.o.   MRN: [id number]  HPI  patient is here today for annual physical. Patient feels well overall.  complains of GERD symptoms :heartburn and indigestion, exacerbated by stress Occurs after meals, 1-2x/month ?therapy options for same  Past Medical History  Diagnosis Date  . HYPERGLYCEMIA   . OBESITY   . GERD (gastroesophageal reflux disease)    Family History  Problem Relation Age of Onset  . Prostate cancer Father   . Hypertension Brother   . Kidney disease Brother   . Stroke Maternal Grandmother   . Colon cancer Neg Hx   . Stomach cancer Neg Hx    History  Substance Use Topics  . Smoking status: Never Smoker   . Smokeless tobacco: Never Used     Comment: Married, lives with spouse (and occ grown son [employer]). works at [employer]  . Alcohol Use: 1.2 oz/week    2 Glasses of wine per week     Review of Systems  Constitutional: Negative for fever or weight change.  Respiratory: Negative for cough and shortness of breath.   Cardiovascular: Negative for chest pain or palpitations.  Gastrointestinal: Negative for abdominal pain, no bowel changes.  Musculoskeletal: Negative for gait problem or joint swelling.  Skin: Negative for rash.  Neurological: Negative for dizziness or headache.  No other specific complaints in a complete review of systems (except as listed in HPI above).     Objective:   Physical Exam  BP 120/82  Pulse 95  Temp(Src) 98.6 F (37 C) (Oral)  Ht 5' 5.5" (1.664 m)  Wt 242 lb (109.77 kg)  BMI 39.64 kg/m2  SpO2 98% Wt Readings from Last 3 Encounters:  06/11/12 242 lb (109.77 kg)  01/16/12 245 lb (111.131 kg)  01/06/12 245 lb (111.131 kg)   Constitutional: She is overweight, but appears well-developed and well-nourished. No distress.  HENT: Head: Normocephalic and atraumatic. Ears: B TMs ok, no erythema or effusion; Nose: Nose normal.  Mouth/Throat: Oropharynx is clear and moist. No oropharyngeal exudate.  Eyes: Conjunctivae and EOM are normal. Pupils are equal, round, and reactive to light. No scleral icterus.  Neck: Normal range of motion. Neck supple. No JVD present. No thyromegaly present.  Cardiovascular: Normal rate, regular rhythm and normal heart sounds.  No murmur heard. No BLE edema. Pulmonary/Chest: Effort normal and breath sounds normal. No respiratory distress. She has no wheezes.  Abdominal: Soft. Bowel sounds are normal. She exhibits no distension. There is no tenderness. no masses Musculoskeletal: Normal range of motion, no joint effusions. No gross deformities Neurological: She is alert and oriented to person, place, and time. No cranial nerve deficit. Coordination normal.  Skin: Skin is warm and dry. No rash noted. No erythema.  Psychiatric: She has a normal mood and affect. Her behavior is normal. Judgment and thought content normal.   Lab Results  Component Value Date   WBC 7.3 06/05/2012   HGB 13.8 06/05/2012   HCT 41.1 06/05/2012   PLT 291.0 06/05/2012   GLUCOSE 117* 06/05/2012   CHOL 146 06/05/2012   TRIG 95.0 06/05/2012   HDL 41.40 06/05/2012   LDLCALC 86 06/05/2012   ALT 22 06/05/2012   AST 30 06/05/2012   NA 135 06/05/2012   K 4.1 06/05/2012   CL 102 06/05/2012   CREATININE 1.0 06/05/2012   BUN 14 06/05/2012   CO2 27 06/05/2012   TSH 1.67 06/05/2012  EKG: NSR  - no ischemic changes or arrhythmias - no change from 06/2011     Assessment & Plan:  CPX/v70.0 - Patient has been counseled on age-appropriate routine health concerns for screening and prevention. These are reviewed and up-to-date. Immunizations are up-to-date or declined. Labs and ECG reviewed.  Also See problem list. Medications and labs reviewed today.

## 2012-06-11 NOTE — Assessment & Plan Note (Signed)
occasional symptoms, exacerbated by stress Recommend over-the-counter medications for control same, patient will call if worse or unimproved with over-the-counter therapy

## 2012-12-15 ENCOUNTER — Ambulatory Visit: Payer: 59 | Admitting: Internal Medicine

## 2013-02-05 ENCOUNTER — Other Ambulatory Visit: Payer: Self-pay

## 2013-02-05 DIAGNOSIS — Z1231 Encounter for screening mammogram for malignant neoplasm of breast: Secondary | ICD-10-CM

## 2013-02-25 ENCOUNTER — Ambulatory Visit
Admission: RE | Admit: 2013-02-25 | Discharge: 2013-02-25 | Disposition: A | Discharge status: Home / Self Care | Payer: 59 | Source: Ambulatory Visit

## 2013-02-25 DIAGNOSIS — Z1231 Encounter for screening mammogram for malignant neoplasm of breast: Secondary | ICD-10-CM

## 2013-02-26 ENCOUNTER — Other Ambulatory Visit: Payer: Self-pay | Admitting: Obstetrics and Gynecology

## 2013-02-26 DIAGNOSIS — R928 Other abnormal and inconclusive findings on diagnostic imaging of breast: Secondary | ICD-10-CM

## 2013-03-10 ENCOUNTER — Ambulatory Visit
Admission: RE | Admit: 2013-03-10 | Discharge: 2013-03-10 | Disposition: A | Discharge status: Home / Self Care | Payer: 59 | Source: Ambulatory Visit | Attending: Obstetrics and Gynecology | Admitting: Obstetrics and Gynecology

## 2013-03-10 DIAGNOSIS — R928 Other abnormal and inconclusive findings on diagnostic imaging of breast: Secondary | ICD-10-CM

## 2013-06-14 ENCOUNTER — Encounter: Payer: 59 | Admitting: Internal Medicine

## 2013-06-24 ENCOUNTER — Other Ambulatory Visit (INDEPENDENT_AMBULATORY_CARE_PROVIDER_SITE_OTHER): Payer: 59

## 2013-06-24 ENCOUNTER — Encounter: Payer: Self-pay | Admitting: Internal Medicine

## 2013-06-24 ENCOUNTER — Ambulatory Visit (INDEPENDENT_AMBULATORY_CARE_PROVIDER_SITE_OTHER): Payer: 59 | Admitting: Internal Medicine

## 2013-06-24 VITALS — BP 120/80 | HR 105 | Temp 98.6°F | Wt 235.2 lb

## 2013-06-24 DIAGNOSIS — Z0000 Encounter for general adult medical examination without abnormal findings: Secondary | ICD-10-CM

## 2013-06-24 DIAGNOSIS — R7309 Other abnormal glucose: Secondary | ICD-10-CM

## 2013-06-24 DIAGNOSIS — E669 Obesity, unspecified: Secondary | ICD-10-CM

## 2013-06-24 LAB — URINALYSIS, ROUTINE W REFLEX MICROSCOPIC
Bilirubin Urine: NEGATIVE
Hgb urine dipstick: NEGATIVE
Ketones, ur: NEGATIVE
LEUKOCYTES UA: NEGATIVE
Nitrite: NEGATIVE
RBC / HPF: NONE SEEN (ref 0–?)
Total Protein, Urine: NEGATIVE
URINE GLUCOSE: NEGATIVE
UROBILINOGEN UA: 0.2 (ref 0.0–1.0)
WBC UA: NONE SEEN (ref 0–?)
pH: 7 (ref 5.0–8.0)

## 2013-06-24 LAB — CBC WITH DIFFERENTIAL/PLATELET
BASOS PCT: 0.5 % (ref 0.0–3.0)
Basophils Absolute: 0 10*3/uL (ref 0.0–0.1)
Eosinophils Absolute: 0.1 10*3/uL (ref 0.0–0.7)
Eosinophils Relative: 2 % (ref 0.0–5.0)
HCT: 40.5 % (ref 36.0–46.0)
HEMOGLOBIN: 13.6 g/dL (ref 12.0–15.0)
Lymphocytes Relative: 32.8 % (ref 12.0–46.0)
Lymphs Abs: 1.9 10*3/uL (ref 0.7–4.0)
MCHC: 33.5 g/dL (ref 30.0–36.0)
MCV: 83 fl (ref 78.0–100.0)
MONO ABS: 0.5 10*3/uL (ref 0.1–1.0)
Monocytes Relative: 9.4 % (ref 3.0–12.0)
NEUTROS ABS: 3.2 10*3/uL (ref 1.4–7.7)
Neutrophils Relative %: 55.3 % (ref 43.0–77.0)
Platelets: 318 10*3/uL (ref 150.0–400.0)
RBC: 4.88 Mil/uL (ref 3.87–5.11)
RDW: 14.4 % (ref 11.5–15.5)
WBC: 5.7 10*3/uL (ref 4.0–10.5)

## 2013-06-24 LAB — HEPATIC FUNCTION PANEL
ALT: 32 U/L (ref 0–35)
AST: 30 U/L (ref 0–37)
Albumin: 4.2 g/dL (ref 3.5–5.2)
Alkaline Phosphatase: 46 U/L (ref 39–117)
Bilirubin, Direct: 0.1 mg/dL (ref 0.0–0.3)
TOTAL PROTEIN: 7.9 g/dL (ref 6.0–8.3)
Total Bilirubin: 0.6 mg/dL (ref 0.2–1.2)

## 2013-06-24 LAB — BASIC METABOLIC PANEL
BUN: 10 mg/dL (ref 6–23)
CHLORIDE: 103 meq/L (ref 96–112)
CO2: 28 meq/L (ref 19–32)
Calcium: 9.8 mg/dL (ref 8.4–10.5)
Creatinine, Ser: 0.8 mg/dL (ref 0.4–1.2)
GFR: 101.41 mL/min (ref >60.00)
Glucose, Bld: 90 mg/dL (ref 70–99)
Potassium: 4.2 mEq/L (ref 3.5–5.1)
SODIUM: 138 meq/L (ref 135–145)

## 2013-06-24 LAB — LIPID PANEL
Cholesterol: 132 mg/dL (ref 0–200)
HDL: 40.1 mg/dL (ref >39.00)
LDL Cholesterol: 77 mg/dL (ref 0–99)
TRIGLYCERIDES: 74 mg/dL (ref 0.0–149.0)
Total CHOL/HDL Ratio: 3
VLDL: 14.8 mg/dL (ref 0.0–40.0)

## 2013-06-24 LAB — HEMOGLOBIN A1C: Hgb A1c MFr Bld: 5.8 % (ref 4.6–6.5)

## 2013-06-24 LAB — TSH: TSH: 1.13 u[IU]/mL (ref 0.35–4.50)

## 2013-06-24 NOTE — Assessment & Plan Note (Signed)
Wt Readings from Last 3 Encounters:  06/24/13 235 lb 3.2 oz (106.686 kg)  06/11/12 242 lb (109.77 kg)  01/16/12 245 lb (111.131 kg)   Weight trends reviewed The patient is asked to make an attempt to improve diet and exercise patterns to aid in medical management of this problem.

## 2013-06-24 NOTE — Assessment & Plan Note (Signed)
Never with "diabetes mellitus" but remains "borderline", no FH DM Advised on glycemic index and carb counting - will continue to work on weight reduction and aerobic exercise in addition to diet Check a1c annually to monitor

## 2013-06-24 NOTE — Patient Instructions (Addendum)
It was good to see you today.  We have reviewed your prior records including labs and tests today  Health Maintenance reviewed - all recommended immunizations and age-appropriate screenings are up-to-date.  Test(s) ordered today. Your results will be released to [place] (or called to you) after review, usually within 72hours after test completion. If any changes need to be made, you will be notified at that same time.  Continue to work on lifestyle changes as discussed (low fat, low carb, increased protein diet; improved exercise efforts; weight loss) to control sugar, blood pressure and cholesterol levels and/or reduce risk of developing other medical problems. Look into http://vang.com/ or other type of food journal to assist you in this process.  Medications reviewed and updated, no changes recommended at this time.  Please schedule followup in 12 months for annual exam and labs, call sooner if problems.  Health Maintenance, Female A healthy lifestyle and preventative care can promote health and wellness.  Maintain regular health, dental, and eye exams.  Eat a healthy diet. Foods like vegetables, fruits, whole grains, low-fat dairy products, and lean protein foods contain the nutrients you need without too many calories. Decrease your intake of foods high in solid fats, added sugars, and salt. Get information about a proper diet from your caregiver, if necessary.  Regular physical exercise is one of the most important things you can do for your health. Most adults should get at least 150 minutes of moderate-intensity exercise (any activity that increases your heart rate and causes you to sweat) each week. In addition, most adults need muscle-strengthening exercises on 2 or more days a week.   Maintain a healthy weight. The body mass index (BMI) is a screening tool to identify possible weight problems. It provides an estimate of body fat based on height and weight. Your caregiver can help  determine your BMI, and can help you achieve or maintain a healthy weight. For adults 20 years and older:  A BMI below 18.5 is considered underweight.  A BMI of 18.5 to 24.9 is normal.  A BMI of 25 to 29.9 is considered overweight.  A BMI of 30 and above is considered obese.  Maintain normal blood lipids and cholesterol by exercising and minimizing your intake of saturated fat. Eat a balanced diet with plenty of fruits and vegetables. Blood tests for lipids and cholesterol should begin at age 74 and be repeated every 5 years. If your lipid or cholesterol levels are high, you are over 50, or you are a high risk for heart disease, you may need your cholesterol levels checked more frequently.Ongoing high lipid and cholesterol levels should be treated with medicines if diet and exercise are not effective.  If you smoke, find out from your caregiver how to quit. If you do not use tobacco, do not start.  Lung cancer screening is recommended for adults aged 94 80 years who are at high risk for developing lung cancer because of a history of smoking. Yearly low-dose computed tomography (CT) is recommended for people who have at least a 30-pack-year history of smoking and are a current smoker or have quit within the past 15 years. A pack year of smoking is smoking an average of 1 pack of cigarettes a day for 1 year (for example: 1 pack a day for 30 years or 2 packs a day for 15 years). Yearly screening should continue until the smoker has stopped smoking for at least 15 years. Yearly screening should also be stopped for people  who develop a health problem that would prevent them from having lung cancer treatment.  If you are pregnant, do not drink alcohol. If you are breastfeeding, be very cautious about drinking alcohol. If you are not pregnant and choose to drink alcohol, do not exceed 1 drink per day. One drink is considered to be 12 ounces (355 mL) of beer, 5 ounces (148 mL) of wine, or 1.5 ounces (44 mL)  of liquor.  Avoid use of street drugs. Do not share needles with anyone. Ask for help if you need support or instructions about stopping the use of drugs.  High blood pressure causes heart disease and increases the risk of stroke. Blood pressure should be checked at least every 1 to 2 years. Ongoing high blood pressure should be treated with medicines, if weight loss and exercise are not effective.  If you are 67 to 52 years old, ask your caregiver if you should take aspirin to prevent strokes.  Diabetes screening involves taking a blood sample to check your fasting blood sugar level. This should be done once every 3 years, after age 17, if you are within normal weight and without risk factors for diabetes. Testing should be considered at a younger age or be carried out more frequently if you are overweight and have at least 1 risk factor for diabetes.  Breast cancer screening is essential preventative care for women. You should practice "breast self-awareness." This means understanding the normal appearance and feel of your breasts and may include breast self-examination. Any changes detected, no matter how small, should be reported to a caregiver. Women in their 26s and 30s should have a clinical breast exam (CBE) by a caregiver as part of a regular health exam every 1 to 3 years. After age 54, women should have a CBE every year. Starting at age 63, women should consider having a mammogram (breast X-ray) every year. Women who have a family history of breast cancer should talk to their caregiver about genetic screening. Women at a high risk of breast cancer should talk to their caregiver about having an MRI and a mammogram every year.  Breast cancer gene (BRCA)-related cancer risk assessment is recommended for women who have family members with BRCA-related cancers. BRCA-related cancers include breast, ovarian, tubal, and peritoneal cancers. Having family members with these cancers may be associated  with an increased risk for harmful changes (mutations) in the breast cancer genes BRCA1 and BRCA2. Results of the assessment will determine the need for genetic counseling and BRCA1 and BRCA2 testing.  The Pap test is a screening test for cervical cancer. Women should have a Pap test starting at age 34. Between ages 23 and 45, Pap tests should be repeated every 2 years. Beginning at age 46, you should have a Pap test every 3 years as long as the past 3 Pap tests have been normal. If you had a hysterectomy for a problem that was not cancer or a condition that could lead to cancer, then you no longer need Pap tests. If you are between ages 20 and 50, and you have had normal Pap tests going back 10 years, you no longer need Pap tests. If you have had past treatment for cervical cancer or a condition that could lead to cancer, you need Pap tests and screening for cancer for at least 20 years after your treatment. If Pap tests have been discontinued, risk factors (such as a new sexual partner) need to be reassessed to determine if screening  should be resumed. Some women have medical problems that increase the chance of getting cervical cancer. In these cases, your caregiver may recommend more frequent screening and Pap tests.  The human papillomavirus (HPV) test is an additional test that may be used for cervical cancer screening. The HPV test looks for the virus that can cause the cell changes on the cervix. The cells collected during the Pap test can be tested for HPV. The HPV test could be used to screen women aged 42 years and older, and should be used in women of any age who have unclear Pap test results. After the age of 40, women should have HPV testing at the same frequency as a Pap test.  Colorectal cancer can be detected and often prevented. Most routine colorectal cancer screening begins at the age of 17 and continues through age 68. However, your caregiver may recommend screening at an earlier age if  you have risk factors for colon cancer. On a yearly basis, your caregiver may provide home test kits to check for hidden blood in the stool. Use of a small camera at the end of a tube, to directly examine the colon (sigmoidoscopy or colonoscopy), can detect the earliest forms of colorectal cancer. Talk to your caregiver about this at age 7, when routine screening begins. Direct examination of the colon should be repeated every 5 to 10 years through age 21, unless early forms of pre-cancerous polyps or small growths are found.  Hepatitis C blood testing is recommended for all people born from [birth year] through 1964 and any individual with known risks for hepatitis C.  Practice safe sex. Use condoms and avoid high-risk sexual practices to reduce the spread of sexually transmitted infections (STIs). Sexually active women aged 47 and younger should be checked for Chlamydia, which is a common sexually transmitted infection. Older women with new or multiple partners should also be tested for Chlamydia. Testing for other STIs is recommended if you are sexually active and at increased risk.  Osteoporosis is a disease in which the bones lose minerals and strength with aging. This can result in serious bone fractures. The risk of osteoporosis can be identified using a bone density scan. Women ages 7 and over and women at risk for fractures or osteoporosis should discuss screening with their caregivers. Ask your caregiver whether you should be taking a calcium supplement or vitamin D to reduce the rate of osteoporosis.  Menopause can be associated with physical symptoms and risks. Hormone replacement therapy is available to decrease symptoms and risks. You should talk to your caregiver about whether hormone replacement therapy is right for you.  Use sunscreen. Apply sunscreen liberally and repeatedly throughout the day. You should seek shade when your shadow is shorter than you. Protect yourself by wearing long sleeves,  pants, a wide-brimmed hat, and sunglasses year round, whenever you are outdoors.  Notify your caregiver of new moles or changes in moles, especially if there is a change in shape or color. Also notify your caregiver if a mole is larger than the size of a pencil eraser.  Stay current with your immunizations. Document Released: 08/06/2010 Document Revised: 05/18/2012 Document Reviewed: 08/06/2010 [facility] Patient Information 2014 [place].

## 2013-06-24 NOTE — Progress Notes (Signed)
Pre visit review using our clinic review tool, if applicable. No additional management support is needed unless otherwise documented below in the visit note. 

## 2013-06-24 NOTE — Progress Notes (Signed)
Subjective:    Patient ID: Isabel [name], female    DOB: 1960-10-21, 53 y.o.   MRN: [id number]  HPI  patient is here today for annual physical. Patient feels well and has no complaints.  Also reviewed chronic medical issues and interval medical events  Past Medical History  Diagnosis Date  . HYPERGLYCEMIA   . OBESITY   . GERD (gastroesophageal reflux disease)    Family History  Problem Relation Age of Onset  . Prostate cancer Father   . Hypertension Brother   . Kidney disease Brother   . Stroke Maternal Grandmother   . Colon cancer Neg Hx   . Stomach cancer Neg Hx    History  Substance Use Topics  . Smoking status: Never Smoker   . Smokeless tobacco: Never Used     Comment: Married, lives with spouse (and occ grown son [employer]). works at [employer]  . Alcohol Use: 1.2 oz/week    2 Glasses of wine per week   Review of Systems  Constitutional: Negative for fatigue and unexpected weight change.  Respiratory: Negative for cough, shortness of breath and wheezing.   Cardiovascular: Negative for chest pain, palpitations and leg swelling.  Gastrointestinal: Negative for nausea, abdominal pain and diarrhea.  Neurological: Negative for dizziness, weakness, light-headedness and headaches.  Psychiatric/Behavioral: Negative for dysphoric mood. The patient is not nervous/anxious.   All other systems reviewed and are negative.      Objective:   Physical Exam  BP 120/80  Pulse 105  Temp(Src) 98.6 F (37 C) (Oral)  Wt 235 lb 3.2 oz (106.686 kg)  SpO2 95% Wt Readings from Last 3 Encounters:  06/24/13 235 lb 3.2 oz (106.686 kg)  06/11/12 242 lb (109.77 kg)  01/16/12 245 lb (111.131 kg)   Constitutional: She is obese, but appears well-developed and well-nourished. No distress.  HENT: Head: Normocephalic and atraumatic. Ears: B TMs ok, no erythema or effusion; Nose: Nose normal. Mouth/Throat: Oropharynx is clear and moist. No oropharyngeal exudate.    Eyes: Conjunctivae and EOM are normal. Pupils are equal, round, and reactive to light. No scleral icterus.  Neck: Normal range of motion. Neck supple. No JVD present. No thyromegaly present.  Cardiovascular: Normal rate, regular rhythm and normal heart sounds.  No murmur heard. No BLE edema. Pulmonary/Chest: Effort normal and breath sounds normal. No respiratory distress. She has no wheezes.  Abdominal: Soft. Bowel sounds are normal. She exhibits no distension. There is no tenderness. no masses Musculoskeletal: Normal range of motion, no joint effusions. No gross deformities Neurological: She is alert and oriented to person, place, and time. No cranial nerve deficit. Coordination, balance, strength, speech and gait are normal.  Skin: Skin is warm and dry. No rash noted. No erythema.  Psychiatric: She has a normal mood and affect. Her behavior is normal. Judgment and thought content normal.    Lab Results  Component Value Date   WBC 7.3 06/05/2012   HGB 13.8 06/05/2012   HCT 41.1 06/05/2012   PLT 291.0 06/05/2012   GLUCOSE 117* 06/05/2012   CHOL 146 06/05/2012   TRIG 95.0 06/05/2012   HDL 41.40 06/05/2012   LDLCALC 86 06/05/2012   ALT 22 06/05/2012   AST 30 06/05/2012   NA 135 06/05/2012   K 4.1 06/05/2012   CL 102 06/05/2012   CREATININE 1.0 06/05/2012   BUN 14 06/05/2012   CO2 27 06/05/2012   TSH 1.67 06/05/2012    Mm Digital Diagnostic Unilat L  03/10/2013   CLINICAL DATA:  Patient returns for evaluation of a possible mass in the left breast noted on recent screening study dated 02/25/2013.  EXAM: DIGITAL DIAGNOSTIC  LEFT MAMMOGRAM  ULTRASOUND LEFT BREAST  COMPARISON:  Prior studies dating back to 01/09/2007  ACR Breast Density Category c: The breast tissue is heterogeneously dense, which may obscure small masses.  FINDINGS: Additional views confirm the presence of a round mass in the posterior central portion of the left breast. At least 75% of the border is circumscribed.  On physical exam, no mass is palpated in  left breast.  Ultrasound is performed, showing a cyst at 6 o'clock 4 cm from the left nipple. This is thought correspond to the mammographic lesion. There is a second cyst at 6 o'clock 9 cm from the left nipple. In reviewing prior studies, the patient has had multiple fluctuating cysts in the past.  IMPRESSION: Left breast cysts.  RECOMMENDATION: Yearly screening mammography is suggested.  I have discussed the findings and recommendations with the patient. Results were also provided in writing at the conclusion of the visit. If applicable, a reminder letter will be sent to the patient regarding the next appointment.  BI-RADS CATEGORY  2: Benign finding(s).   Electronically Signed   By: Ulyess Blossom M.D.   On: 03/10/2013 09:33   US Breast Ltd Uni Left Inc [place]  04/01/2013   CLINICAL DATA:  Patient returns for evaluation of a possible mass in the left breast noted on recent screening study dated 02/25/2013.  EXAM: DIGITAL DIAGNOSTIC LEFT MAMMOGRAM  ULTRASOUND LEFT BREAST  COMPARISON:  Prior studies dating back to 01/09/2007  ACR Breast Density Category c: The breast tissue is heterogeneously dense, which may obscure small masses.  FINDINGS: Additional views confirm the presence of a round mass in the posterior central portion of the left breast. At least 75% of the border is circumscribed.  On physical exam, no mass is palpated in left breast.  Ultrasound is performed, showing a cyst at 6 o'clock 4 cm from the left nipple. This is thought correspond to the mammographic lesion. There is a second cyst at 6 o'clock 9 cm from the left nipple. In reviewing prior studies, the patient has had multiple fluctuating cysts in the past.  IMPRESSION: Left breast cysts.  RECOMMENDATION: Yearly screening mammography is suggested.  I have discussed the findings and recommendations with the patient. Results were also provided in writing at the conclusion of the visit. If applicable, a reminder letter will be sent to the  patient regarding the next appointment.  BI-RADS CATEGORY  2: Benign finding(s).   Electronically Signed   By: Ulyess Blossom M.D.   On: 04/01/2013 13:51       Assessment & Plan:   CPX/v70.0 - Patient has been counseled on age-appropriate routine health concerns for screening and prevention. These are reviewed and up-to-date. Immunizations are up-to-date or declined. Labs ordered/reviewed and prior ECG reviewed.  Problem List Items Addressed This Visit   HYPERGLYCEMIA     Never with "diabetes mellitus" but remains "borderline", no FH DM Advised on glycemic index and carb counting - will continue to work on weight reduction and aerobic exercise in addition to diet Check a1c annually to monitor    Relevant Orders      Hemoglobin A1c   OBESITY      Wt Readings from Last 3 Encounters:  06/24/13 235 lb 3.2 oz (106.686 kg)  06/11/12 242 lb (109.77 kg)  01/16/12 245 lb (  111.131 kg)   Weight trends reviewed The patient is asked to make an attempt to improve diet and exercise patterns to aid in medical management of this problem.     Other Visit Diagnoses   Routine general medical examination at a health care facility    -  Primary    Relevant Orders       Basic metabolic panel       CBC with Differential       Hepatic function panel       Lipid panel       TSH       Urinalysis, Routine w reflex microscopic

## 2014-01-31 ENCOUNTER — Other Ambulatory Visit: Payer: Self-pay

## 2014-01-31 DIAGNOSIS — Z1231 Encounter for screening mammogram for malignant neoplasm of breast: Secondary | ICD-10-CM

## 2014-02-28 ENCOUNTER — Ambulatory Visit
Admission: RE | Admit: 2014-02-28 | Discharge: 2014-02-28 | Disposition: A | Discharge status: Home / Self Care | Payer: 59 | Source: Ambulatory Visit

## 2014-02-28 DIAGNOSIS — Z1231 Encounter for screening mammogram for malignant neoplasm of breast: Secondary | ICD-10-CM

## 2014-03-03 ENCOUNTER — Other Ambulatory Visit: Payer: Self-pay | Admitting: Obstetrics and Gynecology

## 2014-03-04 LAB — CYTOLOGY - PAP

## 2014-04-08 LAB — HM DIABETES EYE EXAM

## 2014-04-20 ENCOUNTER — Encounter: Payer: Self-pay | Admitting: Internal Medicine

## 2014-06-27 ENCOUNTER — Encounter: Payer: 59 | Admitting: Internal Medicine

## 2014-06-28 ENCOUNTER — Telehealth: Payer: Self-pay | Admitting: *Deleted

## 2014-06-28 NOTE — Telephone Encounter (Signed)
[facility] Night - Client TELEPHONE [facility] Patient Name: Isabel [name] Gender: Female DOB: [birth year] Age: 54 Y 39 M 1 D Return Phone Number: [phone] (Primary) Address: City/State/Zip:  [employer] [facility] Night - Client Client Site [place] - Night Physician [place], [place] Type Call Caller Name Markiyah Gahm Phone Number [phone] Relationship To Patient Self Is this call to report lab results? No Call Type General Information Initial Comment Caller states has appt @ 8:45 and cannot keep it; at the [place] airport and trying to get back home. flight delayed; will call to reschedule; called mainline= no answer; auto disconnected 3x; 4th attempt/ L[place]; gave info; General Information Type Appointment Nurse Assessment Guidelines Guideline Title Affirmed Question Affirmed Notes Nurse Date/Time (Eastern Time) Disp. Time Eilene Ghazi Time) Disposition Final User 06/27/2014 8:04:42 AM General Information Provided Yes Jerrye Beavers After Care Instructions Given Call Event Type User Date / Time Description

## 2014-08-22 ENCOUNTER — Encounter: Payer: 59 | Admitting: Internal Medicine

## 2014-09-08 ENCOUNTER — Encounter: Payer: 59 | Admitting: Internal Medicine

## 2014-09-28 ENCOUNTER — Ambulatory Visit (INDEPENDENT_AMBULATORY_CARE_PROVIDER_SITE_OTHER): Payer: 59 | Admitting: Internal Medicine

## 2014-09-28 ENCOUNTER — Other Ambulatory Visit (INDEPENDENT_AMBULATORY_CARE_PROVIDER_SITE_OTHER): Payer: 59

## 2014-09-28 ENCOUNTER — Encounter: Payer: Self-pay | Admitting: Internal Medicine

## 2014-09-28 VITALS — BP 120/72 | HR 96 | Temp 98.8°F | Resp 12 | Wt 244.0 lb

## 2014-09-28 DIAGNOSIS — E669 Obesity, unspecified: Secondary | ICD-10-CM

## 2014-09-28 DIAGNOSIS — Z23 Encounter for immunization: Secondary | ICD-10-CM | POA: Diagnosis not present

## 2014-09-28 DIAGNOSIS — Z0000 Encounter for general adult medical examination without abnormal findings: Secondary | ICD-10-CM | POA: Diagnosis not present

## 2014-09-28 LAB — COMPREHENSIVE METABOLIC PANEL
ALBUMIN: 4.2 g/dL (ref 3.5–5.2)
ALK PHOS: 60 U/L (ref 39–117)
ALT: 24 U/L (ref 0–35)
AST: 24 U/L (ref 0–37)
BILIRUBIN TOTAL: 0.5 mg/dL (ref 0.2–1.2)
BUN: 7 mg/dL (ref 6–23)
CO2: 29 mEq/L (ref 19–32)
Calcium: 9.6 mg/dL (ref 8.4–10.5)
Chloride: 102 mEq/L (ref 96–112)
Creatinine, Ser: 0.77 mg/dL (ref 0.40–1.20)
GFR: 100.91 mL/min (ref >60.00)
Glucose, Bld: 106 mg/dL — ABNORMAL HIGH (ref 70–99)
POTASSIUM: 4.1 meq/L (ref 3.5–5.1)
Sodium: 138 mEq/L (ref 135–145)
TOTAL PROTEIN: 7.9 g/dL (ref 6.0–8.3)

## 2014-09-28 LAB — LIPID PANEL
CHOLESTEROL: 136 mg/dL (ref 0–200)
HDL: 40.2 mg/dL (ref >39.00)
LDL Cholesterol: 71 mg/dL (ref 0–99)
NonHDL: 95.33
Total CHOL/HDL Ratio: 3
Triglycerides: 122 mg/dL (ref 0.0–149.0)
VLDL: 24.4 mg/dL (ref 0.0–40.0)

## 2014-09-28 LAB — CBC
HEMATOCRIT: 42.3 % (ref 36.0–46.0)
Hemoglobin: 14.1 g/dL (ref 12.0–15.0)
MCHC: 33.2 g/dL (ref 30.0–36.0)
MCV: 83.1 fl (ref 78.0–100.0)
PLATELETS: 321 10*3/uL (ref 150.0–400.0)
RBC: 5.09 Mil/uL (ref 3.87–5.11)
RDW: 13.9 % (ref 11.5–15.5)
WBC: 6.1 10*3/uL (ref 4.0–10.5)

## 2014-09-28 LAB — HEMOGLOBIN A1C: Hgb A1c MFr Bld: 5.8 % (ref 4.6–6.5)

## 2014-09-28 MED ORDER — PHENTERMINE HCL 37.5 MG PO CAPS
37.5000 mg | ORAL_CAPSULE | ORAL | Status: DC
Start: 2014-09-28 — End: 2015-03-19

## 2014-09-28 MED ORDER — FLUTICASONE PROPIONATE 50 MCG/ACT NA SUSP: 2.0000 | Freq: Every day | NASAL | Status: DC

## 2014-09-28 NOTE — Progress Notes (Signed)
Pre visit review using our clinic review tool, if applicable. No additional management support is needed unless otherwise documented below in the visit note. 

## 2014-09-28 NOTE — Patient Instructions (Signed)
We have sent in the flonase for the allergies. Use 2 sprays in each nostril daily to get rid of the symptoms. You can use it during allergy season or just as needed. It can take 3-5 days for full effect to kick in.  We have also given you the phentermine for the weight loss that you can try. Take 1 pill a day. The most common side effect is mild nausea or heart racing (usually mild). If you have any problems with it call us and stop the medicine.  Come back in about 3 months so we can check on you.  Health Maintenance Adopting a healthy lifestyle and getting preventive care can go a long way to promote health and wellness. Talk with your health care provider about what schedule of regular examinations is right for you. This is a good chance for you to check in with your provider about disease prevention and staying healthy. In between checkups, there are plenty of things you can do on your own. Experts have done a lot of research about which lifestyle changes and preventive measures are most likely to keep you healthy. Ask your health care provider for more information. WEIGHT AND DIET  Eat a healthy diet  Be sure to include plenty of vegetables, fruits, low-fat dairy products, and lean protein.  Do not eat a lot of foods high in solid fats, added sugars, or salt.  Get regular exercise. This is one of the most important things you can do for your health.  Most adults should exercise for at least 150 minutes each week. The exercise should increase your heart rate and make you sweat (moderate-intensity exercise).  Most adults should also do strengthening exercises at least twice a week. This is in addition to the moderate-intensity exercise.  Maintain a healthy weight  Body mass index (BMI) is a measurement that can be used to identify possible weight problems. It estimates body fat based on height and weight. Your health care provider can help determine your BMI and help you achieve or  maintain a healthy weight.  For females 18 years of age and older:   A BMI below 18.5 is considered underweight.  A BMI of 18.5 to 24.9 is normal.  A BMI of 25 to 29.9 is considered overweight.  A BMI of 30 and above is considered obese.  Watch levels of cholesterol and blood lipids  You should start having your blood tested for lipids and cholesterol at 53 years of age, then have this test every 5 years.  You may need to have your cholesterol levels checked more often if:  Your lipid or cholesterol levels are high.  You are older than 53 years of age.  You are at high risk for heart disease.  CANCER SCREENING   Lung Cancer  Lung cancer screening is recommended for adults 43-78 years old who are at high risk for lung cancer because of a history of smoking.  A yearly low-dose CT scan of the lungs is recommended for people who:  Currently smoke.  Have quit within the past 15 years.  Have at least a 30-pack-year history of smoking. A pack year is smoking an average of one pack of cigarettes a day for 1 year.  Yearly screening should continue until it has been 15 years since you quit.  Yearly screening should stop if you develop a health problem that would prevent you from having lung cancer treatment.  Breast Cancer  Practice breast self-awareness. This means  understanding how your breasts normally appear and feel.  It also means doing regular breast self-exams. Let your health care provider know about any changes, no matter how small.  If you are in your 20s or 30s, you should have a clinical breast exam (CBE) by a health care provider every 1-3 years as part of a regular health exam.  If you are 47 or older, have a CBE every year. Also consider having a breast X-ray (mammogram) every year.  If you have a family history of breast cancer, talk to your health care provider about genetic screening.  If you are at high risk for breast cancer, talk to your health care  provider about having an MRI and a mammogram every year.  Breast cancer gene (BRCA) assessment is recommended for women who have family members with BRCA-related cancers. BRCA-related cancers include:  Breast.  Ovarian.  Tubal.  Peritoneal cancers.  Results of the assessment will determine the need for genetic counseling and BRCA1 and BRCA2 testing. Cervical Cancer Routine pelvic examinations to screen for cervical cancer are no longer recommended for nonpregnant women who are considered low risk for cancer of the pelvic organs (ovaries, uterus, and vagina) and who do not have symptoms. A pelvic examination may be necessary if you have symptoms including those associated with pelvic infections. Ask your health care provider if a screening pelvic exam is right for you.   The Pap test is the screening test for cervical cancer for women who are considered at risk.  If you had a hysterectomy for a problem that was not cancer or a condition that could lead to cancer, then you no longer need Pap tests.  If you are older than 65 years, and you have had normal Pap tests for the past 10 years, you no longer need to have Pap tests.  If you have had past treatment for cervical cancer or a condition that could lead to cancer, you need Pap tests and screening for cancer for at least 20 years after your treatment.  If you no longer get a Pap test, assess your risk factors if they change (such as having a new sexual partner). This can affect whether you should start being screened again.  Some women have medical problems that increase their chance of getting cervical cancer. If this is the case for you, your health care provider may recommend more frequent screening and Pap tests.  The human papillomavirus (HPV) test is another test that may be used for cervical cancer screening. The HPV test looks for the virus that can cause cell changes in the cervix. The cells collected during the Pap test can be  tested for HPV.  The HPV test can be used to screen women 56 years of age and older. Getting tested for HPV can extend the interval between normal Pap tests from three to five years.  An HPV test also should be used to screen women of any age who have unclear Pap test results.  After 53 years of age, women should have HPV testing as often as Pap tests.  Colorectal Cancer  This type of cancer can be detected and often prevented.  Routine colorectal cancer screening usually begins at 53 years of age and continues through 53 years of age.  Your health care provider may recommend screening at an earlier age if you have risk factors for colon cancer.  Your health care provider may also recommend using home test kits to check for hidden blood  in the stool.  A small camera at the end of a tube can be used to examine your colon directly (sigmoidoscopy or colonoscopy). This is done to check for the earliest forms of colorectal cancer.  Routine screening usually begins at age 21.  Direct examination of the colon should be repeated every 5-10 years through 53 years of age. However, you may need to be screened more often if early forms of precancerous polyps or small growths are found. Skin Cancer  Check your skin from head to toe regularly.  Tell your health care provider about any new moles or changes in moles, especially if there is a change in a mole's shape or color.  Also tell your health care provider if you have a mole that is larger than the size of a pencil eraser.  Always use sunscreen. Apply sunscreen liberally and repeatedly throughout the day.  Protect yourself by wearing long sleeves, pants, a wide-brimmed hat, and sunglasses whenever you are outside. HEART DISEASE, DIABETES, AND HIGH BLOOD PRESSURE   Have your blood pressure checked at least every 1-2 years. High blood pressure causes heart disease and increases the risk of stroke.  If you are between 33 years and 30 years  old, ask your health care provider if you should take aspirin to prevent strokes.  Have regular diabetes screenings. This involves taking a blood sample to check your fasting blood sugar level.  If you are at a normal weight and have a low risk for diabetes, have this test once every three years after 53 years of age.  If you are overweight and have a high risk for diabetes, consider being tested at a younger age or more often. PREVENTING INFECTION  Hepatitis B  If you have a higher risk for hepatitis B, you should be screened for this virus. You are considered at high risk for hepatitis B if:  You were born in a country where hepatitis B is common. Ask your health care provider which countries are considered high risk.  Your parents were born in a high-risk country, and you have not been immunized against hepatitis B (hepatitis B vaccine).  You have HIV or AIDS.  You use needles to inject street drugs.  You live with someone who has hepatitis B.  You have had sex with someone who has hepatitis B.  You get hemodialysis treatment.  You take certain medicines for conditions, including cancer, organ transplantation, and autoimmune conditions. Hepatitis C  Blood testing is recommended for:  Everyone born from [birth year] through 1964.  Anyone with known risk factors for hepatitis C. Sexually transmitted infections (STIs)  You should be screened for sexually transmitted infections (STIs) including gonorrhea and chlamydia if:  You are sexually active and are younger than 53 years of age.  You are older than 53 years of age and your health care provider tells you that you are at risk for this type of infection.  Your sexual activity has changed since you were last screened and you are at an increased risk for chlamydia or gonorrhea. Ask your health care provider if you are at risk.  If you do not have HIV, but are at risk, it may be recommended that you take a prescription medicine  daily to prevent HIV infection. This is called pre-exposure prophylaxis (PrEP). You are considered at risk if:  You are sexually active and do not regularly use condoms or know the HIV status of your partner(s).  You take drugs by injection.  You are sexually active with a partner who has HIV. Talk with your health care provider about whether you are at high risk of being infected with HIV. If you choose to begin PrEP, you should first be tested for HIV. You should then be tested every 3 months for as long as you are taking PrEP.  PREGNANCY   If you are premenopausal and you may become pregnant, ask your health care provider about preconception counseling.  If you may become pregnant, take 400 to 800 micrograms (mcg) of folic acid every day.  If you want to prevent pregnancy, talk to your health care provider about birth control (contraception). OSTEOPOROSIS AND MENOPAUSE   Osteoporosis is a disease in which the bones lose minerals and strength with aging. This can result in serious bone fractures. Your risk for osteoporosis can be identified using a bone density scan.  If you are 56 years of age or older, or if you are at risk for osteoporosis and fractures, ask your health care provider if you should be screened.  Ask your health care provider whether you should take a calcium or vitamin D supplement to lower your risk for osteoporosis.  Menopause may have certain physical symptoms and risks.  Hormone replacement therapy may reduce some of these symptoms and risks. Talk to your health care provider about whether hormone replacement therapy is right for you.  HOME CARE INSTRUCTIONS   Schedule regular health, dental, and eye exams.  Stay current with your immunizations.   Do not use any tobacco products including cigarettes, chewing tobacco, or electronic cigarettes.  If you are pregnant, do not drink alcohol.  If you are breastfeeding, limit how much and how often you drink  alcohol.  Limit alcohol intake to no more than 1 drink per day for nonpregnant women. One drink equals 12 ounces of beer, 5 ounces of wine, or 1 ounces of hard liquor.  Do not use street drugs.  Do not share needles.  Ask your health care provider for help if you need support or information about quitting drugs.  Tell your health care provider if you often feel depressed.  Tell your health care provider if you have ever been abused or do not feel safe at home. Document Released: 08/06/2010 Document Revised: 06/07/2013 Document Reviewed: 12/23/2012 [facility] Patient Information 2015 [place], [place]. This information is not intended to replace advice given to you by your health care provider. Make sure you discuss any questions you have with your health care provider.

## 2014-09-29 ENCOUNTER — Encounter: Payer: Self-pay | Admitting: Internal Medicine

## 2014-09-29 DIAGNOSIS — Z0000 Encounter for general adult medical examination without abnormal findings: Secondary | ICD-10-CM | POA: Insufficient documentation

## 2014-09-29 NOTE — Assessment & Plan Note (Signed)
Since last visit weight up about 10 pounds and wants to do try of phentermine to help jump start her weight loss. Rx given for 3 months and needs visit within 3 months for evaluation. Talked about possible side effects and the need to partner this with diet changes and increased exercise. 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 We talked about keeping healthy snacks around instead.

## 2014-09-29 NOTE — Assessment & Plan Note (Signed)
Flu shot given at visit, reminded tetanus due this year she wishes to wait, colonoscopy and mammogram up to date.

## 2014-09-29 NOTE — Progress Notes (Signed)
   Subjective:    Patient ID: Isabel [name], female    DOB: March 04, 1960, 54 y.o.   MRN: [id number]  HPI The patient is a 54 YO female coming in for wellness with a couple of questions. Please see A/P for status and treatment of chronic medical conditions.   PMH, [facility], social history reviewed and updated.   Review of Systems  Constitutional: Positive for activity change. Negative for fever, appetite change, fatigue and unexpected weight change.       Exercising less  HENT: Positive for congestion and postnasal drip.   Eyes: Negative.   Respiratory: Negative for cough, chest tightness, shortness of breath and wheezing.   Cardiovascular: Negative for chest pain, palpitations and leg swelling.  Gastrointestinal: Negative for nausea, abdominal pain, diarrhea, constipation and abdominal distention.  Musculoskeletal: Negative.   Skin: Negative.   Neurological: Negative.   Psychiatric/Behavioral: Negative.       Objective:   Physical Exam  Constitutional: She is oriented to person, place, and time. She appears well-developed and well-nourished.  Overweight  HENT:  Head: Normocephalic and atraumatic.  Oropharynx with mild erythema, clear drainage.  Eyes: EOM are normal.  Neck: Normal range of motion.  Cardiovascular: Normal rate and regular rhythm.   Pulmonary/Chest: Effort normal and breath sounds normal.  Abdominal: Soft. She exhibits no distension. There is no tenderness. There is no rebound.  Musculoskeletal: She exhibits no edema.  Neurological: She is alert and oriented to person, place, and time. Coordination normal.  Skin: Skin is warm and dry.  Psychiatric: She has a normal mood and affect.   Filed Vitals:   09/28/14 0834  BP: 120/72  Pulse: 96  Temp: 98.8 F (37.1 C)  TempSrc: Oral  Resp: 12  Weight: 244 lb (110.678 kg)  SpO2: 98%      Assessment & Plan:  Flu shot given at visit.

## 2014-10-06 ENCOUNTER — Telehealth: Payer: Self-pay | Admitting: *Deleted

## 2014-10-06 NOTE — Telephone Encounter (Signed)
Left msg on triage requesting lab results. Called pt back gave her md response on labs...Isabel [name]

## 2014-12-26 ENCOUNTER — Ambulatory Visit: Payer: 59 | Admitting: Internal Medicine

## 2015-02-01 ENCOUNTER — Other Ambulatory Visit: Payer: Self-pay

## 2015-02-01 DIAGNOSIS — Z1231 Encounter for screening mammogram for malignant neoplasm of breast: Secondary | ICD-10-CM

## 2015-02-02 ENCOUNTER — Ambulatory Visit (INDEPENDENT_AMBULATORY_CARE_PROVIDER_SITE_OTHER): Payer: 59 | Admitting: Internal Medicine

## 2015-02-02 ENCOUNTER — Encounter: Payer: Self-pay | Admitting: Internal Medicine

## 2015-02-02 VITALS — BP 122/84 | HR 103 | Temp 98.2°F | Ht 66.0 in | Wt 243.0 lb

## 2015-02-02 DIAGNOSIS — J019 Acute sinusitis, unspecified: Secondary | ICD-10-CM | POA: Diagnosis not present

## 2015-02-02 DIAGNOSIS — R7309 Other abnormal glucose: Secondary | ICD-10-CM | POA: Diagnosis not present

## 2015-02-02 MED ORDER — HYDROCODONE-HOMATROPINE 5-1.5 MG/5ML PO SYRP: 5.0000 mL | ORAL_SOLUTION | Freq: Four times a day (QID) | ORAL | Status: DC | PRN

## 2015-02-02 MED ORDER — LEVOFLOXACIN 250 MG PO TABS: 250.0000 mg | ORAL_TABLET | Freq: Every day | ORAL | Status: DC

## 2015-02-02 NOTE — Patient Instructions (Signed)
Please take all new medication as prescribed - the antibiotic, and the cough medicine as needed  You can also take Mucinex (or it's generic off brand) for congestion, and tylenol as needed for pain.  Please continue all other medications as before, and refills have been done if requested.  Please have the pharmacy call with any other refills you may need.  Please keep your appointments with your specialists as you may have planned    

## 2015-02-02 NOTE — Progress Notes (Signed)
Pre visit review using our clinic review tool, if applicable. No additional management support is needed unless otherwise documented below in the visit note. 

## 2015-02-02 NOTE — Progress Notes (Signed)
   Subjective:    Patient ID: Isabel [name], female    DOB: 08-12-1960, 54 y.o.   MRN: [id number]  HPI   Here with 2-3 days acute onset fever, facial pain, pressure, headache, general weakness and malaise, and greenish d/c, with mild ST and cough, but pt denies chest pain, wheezing, increased sob or doe, orthopnea, PND, increased LE swelling, palpitations, dizziness or syncope.  Pt denies polydipsia, polyuria,  Past Medical History  Diagnosis Date  . HYPERGLYCEMIA   . OBESITY   . GERD (gastroesophageal reflux disease)    Past Surgical History  Procedure Laterality Date  . Laparoscopic cholecystectomy  2005  . Cesarean section  1992    reports that she has never smoked. She has never used smokeless tobacco. She reports that she drinks about 1.2 oz of alcohol per week. She reports that she does not use illicit drugs. family history includes Hypertension in her brother; Kidney disease in her brother; Prostate cancer in her father; Stroke in her maternal grandmother. There is no history of Colon cancer or Stomach cancer. No Known Allergies Current Outpatient Prescriptions on File Prior to Visit  Medication Sig Dispense Refill  . Multiple Vitamins-Calcium (ONE-A-DAY WOMENS FORMULA) TABS Take by mouth daily. Reported on 02/02/2015    . fluticasone (FLONASE) 50 MCG/ACT nasal spray Place 2 sprays into both nostrils daily. (Patient not taking: Reported on 02/02/2015) 16 g 6  . phentermine 37.5 MG capsule Take 1 capsule (37.5 mg total) by mouth every morning. (Patient not taking: Reported on 02/02/2015) 30 capsule 2   No current facility-administered medications on file prior to visit.   Review of Systems All otherwise neg per pt    Objective:   Physical Exam BP 122/84 mmHg  Pulse 103  Temp(Src) 98.2 F (36.8 C) (Oral)  Ht 5\' 6"  (1.676 m)  Wt 243 lb (110.224 kg)  BMI 39.24 kg/m2  SpO2 98%  LMP 11/22/2011 VS noted, mild ill Constitutional: Pt appears in no significant distress HENT:  Head: NCAT.  Right Ear: External ear normal.  Left Ear: External ear normal.  Eyes: . Pupils are equal, round, and reactive to light. Conjunctivae and EOM are normal Neck: Normal range of motion. Neck supple. Bilat tm's with mild erythema.  Max sinus areas mild tender.  Pharynx with mild erythema, no exudate  Cardiovascular: Normal rate and regular rhythm.   Pulmonary/Chest: Effort normal and breath sounds without rales or wheezing.  Neurological: Pt is alert. Not confused , motor grossly intact Skin: Skin is warm. No rash, no LE edema Psychiatric: Pt behavior is normal. No agitation.     Assessment & Plan:

## 2015-02-03 NOTE — Assessment & Plan Note (Signed)
stable overall by history and exam, recent data reviewed with pt, and pt to continue medical treatment as before,  to f/u any worsening symptoms or concerns Lab Results  Component Value Date   HGBA1C 5.8 09/28/2014

## 2015-02-03 NOTE — Assessment & Plan Note (Signed)
Mild to mod, for antibx course,  to f/u any worsening symptoms or concerns 

## 2015-02-22 ENCOUNTER — Encounter: Payer: Self-pay | Admitting: Internal Medicine

## 2015-02-22 ENCOUNTER — Ambulatory Visit (INDEPENDENT_AMBULATORY_CARE_PROVIDER_SITE_OTHER): Payer: 59 | Admitting: Internal Medicine

## 2015-02-22 VITALS — BP 140/82 | HR 100 | Temp 98.0°F | Ht 66.0 in | Wt 248.0 lb

## 2015-02-22 DIAGNOSIS — J069 Acute upper respiratory infection, unspecified: Secondary | ICD-10-CM | POA: Insufficient documentation

## 2015-02-22 MED ORDER — HYDROCODONE-HOMATROPINE 5-1.5 MG/5ML PO SYRP: 5.0000 mL | ORAL_SOLUTION | Freq: Four times a day (QID) | ORAL | Status: DC | PRN

## 2015-02-22 MED ORDER — AZITHROMYCIN 250 MG PO TABS: ORAL_TABLET | ORAL | Status: DC

## 2015-02-22 NOTE — Progress Notes (Signed)
Pre visit review using our clinic review tool, if applicable. No additional management support is needed unless otherwise documented below in the visit note. 

## 2015-02-22 NOTE — Patient Instructions (Signed)
Please take all new medication as prescribed - the antibiotic, and cough medicine  Please continue all other medications as before, and refills have been done if requested.  Please have the pharmacy call with any other refills you may need.  Please keep your appointments with your specialists as you may have planned      

## 2015-02-27 NOTE — Assessment & Plan Note (Signed)
Mild to mod, for antibx course,  to f/u any worsening symptoms or concerns 

## 2015-02-27 NOTE — Progress Notes (Signed)
   Subjective:    Patient ID: Isabel [name], female    DOB: December 27, 1960, 55 y.o.   MRN: [id number]  HPI   Here with 2-3 days acute onset fever, facial pain, pressure, headache, general weakness and malaise, and greenish d/c, with mild ST and cough, but pt denies chest pain, wheezing, increased sob or doe, orthopnea, PND, increased LE swelling, palpitations, dizziness or syncope.  Similar symptoms as late dec visit as well, improved, now worse again. Past Medical History  Diagnosis Date  . HYPERGLYCEMIA   . OBESITY   . GERD (gastroesophageal reflux disease)    Past Surgical History  Procedure Laterality Date  . Laparoscopic cholecystectomy  2005  . Cesarean section  1992    reports that she has never smoked. She has never used smokeless tobacco. She reports that she drinks about 1.2 oz of alcohol per week. She reports that she does not use illicit drugs. family history includes Hypertension in her brother; Kidney disease in her brother; Prostate cancer in her father; Stroke in her maternal grandmother. There is no history of Colon cancer or Stomach cancer. No Known Allergies Current Outpatient Prescriptions on File Prior to Visit  Medication Sig Dispense Refill  . Multiple Vitamins-Calcium (ONE-A-DAY WOMENS FORMULA) TABS Take by mouth daily. Reported on 02/02/2015    . fluticasone (FLONASE) 50 MCG/ACT nasal spray Place 2 sprays into both nostrils daily. (Patient not taking: Reported on 02/02/2015) 16 g 6  . phentermine 37.5 MG capsule Take 1 capsule (37.5 mg total) by mouth every morning. (Patient not taking: Reported on 02/02/2015) 30 capsule 2   No current facility-administered medications on file prior to visit.   Review of Systems All otherwise neg per pt     Objective:   Physical Exam BP 140/82 mmHg  Pulse 100  Temp(Src) 98 F (36.7 C) (Oral)  Ht 5\' 6"  (1.676 m)  Wt 248 lb (112.492 kg)  BMI 40.05 kg/m2  SpO2 98%  LMP 11/22/2011 VS noted,  Constitutional: Pt appears in  no significant distress HENT: Head: NCAT.  Right Ear: External ear normal.  Left Ear: External ear normal.  Eyes: . Pupils are equal, round, and reactive to light. Conjunctivae and EOM are normal Bilat tm's with mild erythema.  Max sinus areas mild tender.  Pharynx with mild erythema, no exudate Neck: Normal range of motion. Neck supple.  Cardiovascular: Normal rate and regular rhythm.   Pulmonary/Chest: Effort normal and breath sounds without rales or wheezing.  Neurological: Pt is alert. Not confused , motor grossly intact Skin: Skin is warm. No rash, no LE edema Psychiatric: Pt behavior is normal. No agitation.     Assessment & Plan:

## 2015-03-03 ENCOUNTER — Ambulatory Visit
Admission: RE | Admit: 2015-03-03 | Discharge: 2015-03-03 | Disposition: A | Discharge status: Home / Self Care | Payer: 59 | Source: Ambulatory Visit

## 2015-03-03 DIAGNOSIS — Z1231 Encounter for screening mammogram for malignant neoplasm of breast: Secondary | ICD-10-CM

## 2015-03-19 ENCOUNTER — Ambulatory Visit (INDEPENDENT_AMBULATORY_CARE_PROVIDER_SITE_OTHER): Payer: 59 | Admitting: Family Medicine

## 2015-03-19 VITALS — BP 132/80 | HR 91 | Temp 98.4°F | Resp 16 | Ht 66.0 in | Wt 244.0 lb

## 2015-03-19 DIAGNOSIS — J069 Acute upper respiratory infection, unspecified: Secondary | ICD-10-CM

## 2015-03-19 DIAGNOSIS — J029 Acute pharyngitis, unspecified: Secondary | ICD-10-CM | POA: Diagnosis not present

## 2015-03-19 DIAGNOSIS — J039 Acute tonsillitis, unspecified: Secondary | ICD-10-CM

## 2015-03-19 LAB — POCT RAPID STREP A (OFFICE): Rapid Strep A Screen: NEGATIVE

## 2015-03-19 MED ORDER — BENZONATATE 100 MG PO CAPS: 200.0000 mg | ORAL_CAPSULE | Freq: Two times a day (BID) | ORAL | Status: DC | PRN

## 2015-03-19 MED ORDER — AMOXICILLIN 500 MG PO TABS: 500.0000 mg | ORAL_TABLET | Freq: Two times a day (BID) | ORAL | Status: DC

## 2015-03-19 MED ORDER — HYDROCODONE-HOMATROPINE 5-1.5 MG/5ML PO SYRP: 5.0000 mL | ORAL_SOLUTION | Freq: Every evening | ORAL | Status: DC | PRN

## 2015-03-19 NOTE — Progress Notes (Signed)
Chief Complaint:  Chief Complaint  Patient presents with  . Cough    x 1 week   . Sore Throat    x 3 days  . loss of appetite    x 2 days     HPI: Isabel [name] is a 55 y.o. female who reports to [facility] today complaining of sore throat x 3-7 days. No fevers or chills. 1 week hx of productive Cough. Decrease appetite. No n/v/abd pain, rash diarrhea/ + sick cntacts. + 1 week  Of sxs    Past Medical History  Diagnosis Date  . HYPERGLYCEMIA   . OBESITY   . GERD (gastroesophageal reflux disease)    Past Surgical History  Procedure Laterality Date  . Laparoscopic cholecystectomy  2005  . Cesarean section  37   Social History   Social History  . Marital Status: Married    Spouse Name: N/A  . Number of Children: N/A  . Years of Education: N/A   Social History Main Topics  . Smoking status: Never Smoker   . 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 Alcohol Use: 1.2 oz/week    2 Glasses of wine per week  . Drug Use: No  . Sexual Activity: Not Asked   Other Topics Concern  . None   Social History Narrative   Family History  Problem Relation Age of Onset  . Prostate cancer Father   . Hypertension Brother   . Kidney disease Brother   . Stroke Maternal Grandmother   . Colon cancer Neg Hx   . Stomach cancer Neg Hx    No Known Allergies Prior to Admission medications   Medication Sig Start Date End Date Taking? Authorizing Provider  azithromycin (ZITHROMAX Z-PAK) 250 MG tablet Use as directed 02/22/15  Yes Biagio Borg, MD  HYDROcodone-homatropine [facility]) 5-1.5 MG/5ML syrup Take 5 mLs by mouth every 6 (six) hours as needed for cough. 02/22/15  Yes Biagio Borg, MD  Multiple Vitamins-Calcium (ONE-A-DAY WOMENS FORMULA) TABS Take by mouth daily. Reported on 02/02/2015   Yes Historical Provider, MD     ROS: The patient denies fevers, chills, night sweats, unintentional weight loss, chest  pain, palpitations, wheezing, dyspnea on exertion, nausea, vomiting, abdominal pain, dysuria, hematuria, melena, numbness, weakness, or tingling.   All other systems have been reviewed and were otherwise negative with the exception of those mentioned in the HPI and as above.    PHYSICAL EXAM: Filed Vitals:   03/19/15 0920  BP: 132/80  Pulse: 91  Temp: 98.4 F (36.9 C)  Resp: 16   Body mass index is 39.4 kg/(m^2).   General: Alert, no acute distress HEENT:  Normocephalic, atraumatic, oropharynx patent. EOMI, PERRLA Erythematous throat, no exudates, TM normal, neg sinus tenderness, + erythematous/boggy nasal mucosa; + left tonsils erythematous Cardiovascular:  Regular rate and rhythm, no rubs murmurs or gallops.   Respiratory: Clear to auscultation bilaterally.  No wheezes, rales, or rhonchi.  No cyanosis, no use of accessory musculature Abdominal: No organomegaly, abdomen is soft and non-tender, positive bowel sounds. No masses. Skin: No rashes. Neurologic: Facial musculature symmetric. Psychiatric: Patient acts appropriately throughout our interaction. Lymphatic: No cervical or submandibular lymphadenopathy Musculoskeletal: Gait intact. No edema, tenderness   LABS: Results for orders placed or performed in visit on 03/19/15  POCT rapid strep A  Result Value Ref Range   Rapid Strep A Screen Negative Negative  EKG/XRAY:   Primary read interpreted by Dr. Marin Comment at [facility].   ASSESSMENT/PLAN: Encounter Diagnoses  Name Primary?  . Acute pharyngitis, unspecified pharyngitis type Yes  . Acute tonsillitis, unspecified etiology   . Acute upper respiratory infection    Amox for presumptive tonsillitis Tessalon perles Hycodan prn  Throat cx  Pending    Gross sideeffects, risk and benefits, and alternatives of medications d/w patient. Patient is aware that all medications have potential sideeffects and we are unable to predict every sideeffect or drug-drug interaction that may  occur.  Anarely Nicholls DO  03/19/2015 10:10 AM

## 2015-03-21 LAB — CULTURE, GROUP A STREP: Organism ID, Bacteria: NORMAL

## 2015-11-07 ENCOUNTER — Encounter: Payer: Self-pay | Admitting: Student

## 2016-01-17 ENCOUNTER — Other Ambulatory Visit: Payer: Self-pay | Admitting: Obstetrics and Gynecology

## 2016-01-17 DIAGNOSIS — Z1231 Encounter for screening mammogram for malignant neoplasm of breast: Secondary | ICD-10-CM

## 2016-03-04 ENCOUNTER — Ambulatory Visit
Admission: RE | Admit: 2016-03-04 | Discharge: 2016-03-04 | Disposition: A | Discharge status: Home / Self Care | Payer: 59 | Source: Ambulatory Visit | Attending: Obstetrics and Gynecology | Admitting: Obstetrics and Gynecology

## 2016-03-04 DIAGNOSIS — Z1231 Encounter for screening mammogram for malignant neoplasm of breast: Secondary | ICD-10-CM | POA: Diagnosis not present

## 2016-03-05 ENCOUNTER — Other Ambulatory Visit: Payer: Self-pay | Admitting: Obstetrics and Gynecology

## 2016-03-05 DIAGNOSIS — R928 Other abnormal and inconclusive findings on diagnostic imaging of breast: Secondary | ICD-10-CM

## 2016-03-08 ENCOUNTER — Ambulatory Visit
Admission: RE | Admit: 2016-03-08 | Discharge: 2016-03-08 | Disposition: A | Discharge status: Home / Self Care | Payer: 59 | Source: Ambulatory Visit | Attending: Obstetrics and Gynecology | Admitting: Obstetrics and Gynecology

## 2016-03-08 ENCOUNTER — Other Ambulatory Visit: Payer: 59

## 2016-03-08 DIAGNOSIS — R928 Other abnormal and inconclusive findings on diagnostic imaging of breast: Secondary | ICD-10-CM

## 2016-03-08 DIAGNOSIS — N6489 Other specified disorders of breast: Secondary | ICD-10-CM | POA: Diagnosis not present

## 2016-03-12 ENCOUNTER — Other Ambulatory Visit (INDEPENDENT_AMBULATORY_CARE_PROVIDER_SITE_OTHER): Payer: 59

## 2016-03-12 ENCOUNTER — Ambulatory Visit (INDEPENDENT_AMBULATORY_CARE_PROVIDER_SITE_OTHER): Payer: 59 | Admitting: Internal Medicine

## 2016-03-12 ENCOUNTER — Encounter: Payer: Self-pay | Admitting: Internal Medicine

## 2016-03-12 VITALS — BP 148/80 | HR 105 | Temp 98.3°F | Ht 66.0 in | Wt 240.0 lb

## 2016-03-12 DIAGNOSIS — Z6838 Body mass index (BMI) 38.0-38.9, adult: Secondary | ICD-10-CM

## 2016-03-12 DIAGNOSIS — Z0000 Encounter for general adult medical examination without abnormal findings: Secondary | ICD-10-CM

## 2016-03-12 DIAGNOSIS — Z23 Encounter for immunization: Secondary | ICD-10-CM | POA: Diagnosis not present

## 2016-03-12 DIAGNOSIS — K219 Gastro-esophageal reflux disease without esophagitis: Secondary | ICD-10-CM

## 2016-03-12 DIAGNOSIS — R030 Elevated blood-pressure reading, without diagnosis of hypertension: Secondary | ICD-10-CM | POA: Diagnosis not present

## 2016-03-12 DIAGNOSIS — E6609 Other obesity due to excess calories: Secondary | ICD-10-CM | POA: Diagnosis not present

## 2016-03-12 LAB — COMPREHENSIVE METABOLIC PANEL
ALBUMIN: 4.6 g/dL (ref 3.5–5.2)
ALT: 34 U/L (ref 0–35)
AST: 30 U/L (ref 0–37)
Alkaline Phosphatase: 51 U/L (ref 39–117)
BILIRUBIN TOTAL: 0.4 mg/dL (ref 0.2–1.2)
BUN: 13 mg/dL (ref 6–23)
CALCIUM: 10 mg/dL (ref 8.4–10.5)
CO2: 28 meq/L (ref 19–32)
Chloride: 102 mEq/L (ref 96–112)
Creatinine, Ser: 0.78 mg/dL (ref 0.40–1.20)
GFR: 98.87 mL/min (ref >60.00)
Glucose, Bld: 101 mg/dL — ABNORMAL HIGH (ref 70–99)
Potassium: 4 mEq/L (ref 3.5–5.1)
Sodium: 135 mEq/L (ref 135–145)
Total Protein: 8.1 g/dL (ref 6.0–8.3)

## 2016-03-12 LAB — HEMOGLOBIN A1C: Hgb A1c MFr Bld: 6 % (ref 4.6–6.5)

## 2016-03-12 LAB — CBC
HCT: 41.7 % (ref 36.0–46.0)
Hemoglobin: 13.9 g/dL (ref 12.0–15.0)
MCHC: 33.4 g/dL (ref 30.0–36.0)
MCV: 83.6 fl (ref 78.0–100.0)
PLATELETS: 322 10*3/uL (ref 150.0–400.0)
RBC: 4.98 Mil/uL (ref 3.87–5.11)
RDW: 14.5 % (ref 11.5–15.5)
WBC: 6.1 10*3/uL (ref 4.0–10.5)

## 2016-03-12 LAB — LIPID PANEL
CHOL/HDL RATIO: 3
CHOLESTEROL: 137 mg/dL (ref 0–200)
HDL: 45.5 mg/dL (ref >39.00)
LDL CALC: 67 mg/dL (ref 0–99)
NonHDL: 91
TRIGLYCERIDES: 118 mg/dL (ref 0.0–149.0)
VLDL: 23.6 mg/dL (ref 0.0–40.0)

## 2016-03-12 LAB — HEPATITIS C ANTIBODY: HCV AB: NEGATIVE

## 2016-03-12 MED ORDER — LORCASERIN HCL ER 20 MG PO TB24: 20.0000 mg | ORAL_TABLET | Freq: Every day | ORAL | 5 refills | Status: DC

## 2016-03-12 NOTE — Assessment & Plan Note (Signed)
Doing well without meds, rare symptoms.

## 2016-03-12 NOTE — Progress Notes (Signed)
   Subjective:    Patient ID: Isabel [name], female    DOB: 10/14/1960, 56 y.o.   MRN: [id number]  HPI The patient is a 56 YO female coming in for wellness. No new concerns. Wants to lose weight.  PMH, [facility], social history reviewed and updated.   Review of Systems  Constitutional: Positive for activity change. Negative for appetite change, fatigue, fever and unexpected weight change.       Not exercising  HENT: Negative.   Eyes: Negative.   Respiratory: Negative for cough, chest tightness and shortness of breath.   Cardiovascular: Negative for chest pain, palpitations and leg swelling.  Gastrointestinal: Negative for abdominal distention, abdominal pain, constipation, diarrhea, nausea and vomiting.  Musculoskeletal: Negative.   Skin: Negative.   Neurological: Negative.   Psychiatric/Behavioral: Negative.       Objective:   Physical Exam  Constitutional: She is oriented to person, place, and time. She appears well-developed and well-nourished.  Overweight  HENT:  Head: Normocephalic and atraumatic.  Eyes: EOM are normal.  Neck: Normal range of motion.  Cardiovascular: Normal rate and regular rhythm.   Pulmonary/Chest: Effort normal and breath sounds normal. No respiratory distress. She has no wheezes. She has no rales.  Abdominal: Soft. Bowel sounds are normal. She exhibits no distension. There is no tenderness. There is no rebound.  Musculoskeletal: She exhibits no edema.  Neurological: She is alert and oriented to person, place, and time. Coordination normal.  Skin: Skin is warm and dry.  Psychiatric: She has a normal mood and affect.   Vitals:   03/12/16 1059 03/12/16 1132  BP: (!) 150/80 (!) 148/80  Pulse: (!) 105   Temp: 98.3 F (36.8 C)   TempSrc: Oral   SpO2: 100%   Weight: 240 lb (108.9 kg)   Height: 5\' 6"  (1.676 m)       Assessment & Plan:  Flu shot given at visit.

## 2016-03-12 NOTE — Assessment & Plan Note (Signed)
BP rechecked which is slightly lower. She will check the blood pressure at home 1-2 times per month and call us back if high.

## 2016-03-12 NOTE — Assessment & Plan Note (Signed)
Rx for belviq to help with her eating habits. She declines to talk to nutritionist today.

## 2016-03-12 NOTE — Progress Notes (Signed)
Pre visit review using our clinic review tool, if applicable. No additional management support is needed unless otherwise documented below in the visit note. 

## 2016-03-12 NOTE — Patient Instructions (Signed)
We have given you the belviq prescription which can help with cravings.   We will check the labs today and have given you the flu shot.    Exercising to Lose Weight Introduction Exercising can help you to lose weight. In order to lose weight through exercise, you need to do vigorous-intensity exercise. You can tell that you are exercising with vigorous intensity if you are breathing very hard and fast and cannot hold a conversation while exercising. Moderate-intensity exercise helps to maintain your current weight. You can tell that you are exercising at a moderate level if you have a higher heart rate and faster breathing, but you are still able to hold a conversation. How often should I exercise? Choose an activity that you enjoy and set realistic goals. Your health care provider can help you to make an activity plan that works for you. Exercise regularly as directed by your health care provider. This may include:  Doing resistance training twice each week, such as:  Push-ups.  Sit-ups.  Lifting weights.  Using resistance bands.  Doing a given intensity of exercise for a given amount of time. Choose from these options:  150 minutes of moderate-intensity exercise every week.  75 minutes of vigorous-intensity exercise every week.  A mix of moderate-intensity and vigorous-intensity exercise every week. Children, pregnant women, people who are out of shape, people who are overweight, and older adults may need to consult a health care provider for individual recommendations. If you have any sort of medical condition, be sure to consult your health care provider before starting a new exercise program. What are some activities that can help me to lose weight?  Walking at a rate of at least 4.5 miles an hour.  Jogging or running at a rate of 5 miles per hour.  Biking at a rate of at least 10 miles per hour.  Lap swimming.  Roller-skating or in-line skating.  Cross-country  skiing.  Vigorous competitive sports, such as football, basketball, and soccer.  Jumping rope.  Aerobic dancing. How can I be more active in my day-to-day activities?  Use the stairs instead of the elevator.  Take a walk during your lunch break.  If you drive, park your car farther away from work or school.  If you take public transportation, get off one stop early and walk the rest of the way.  Make all of your phone calls while standing up and walking around.  Get up, stretch, and walk around every 30 minutes throughout the day. What guidelines should I follow while exercising?  Do not exercise so much that you hurt yourself, feel dizzy, or get very short of breath.  Consult your health care provider prior to starting a new exercise program.  Wear comfortable clothes and shoes with good support.  Drink plenty of water while you exercise to prevent dehydration or heat stroke. Body water is lost during exercise and must be replaced.  Work out until you breathe faster and your heart beats faster. This information is not intended to replace advice given to you by your health care provider. Make sure you discuss any questions you have with your health care provider. Document Released: 02/23/2010 Document Revised: 06/29/2015 Document Reviewed: 06/24/2013  2017 Elsevier  Health Maintenance, Female Introduction Adopting a healthy lifestyle and getting preventive care can go a long way to promote health and wellness. Talk with your health care provider about what schedule of regular examinations is right for you. This is a good chance for  you to check in with your provider about disease prevention and staying healthy. In between checkups, there are plenty of things you can do on your own. Experts have done a lot of research about which lifestyle changes and preventive measures are most likely to keep you healthy. Ask your health care provider for more information. Weight and  diet Eat a healthy diet  Be sure to include plenty of vegetables, fruits, low-fat dairy products, and lean protein.  Do not eat a lot of foods high in solid fats, added sugars, or salt.  Get regular exercise. This is one of the most important things you can do for your health.  Most adults should exercise for at least 150 minutes each week. The exercise should increase your heart rate and make you sweat (moderate-intensity exercise).  Most adults should also do strengthening exercises at least twice a week. This is in addition to the moderate-intensity exercise. Maintain a healthy weight  Body mass index (BMI) is a measurement that can be used to identify possible weight problems. It estimates body fat based on height and weight. Your health care provider can help determine your BMI and help you achieve or maintain a healthy weight.  For females 67 years of age and older:  A BMI below 18.5 is considered underweight.  A BMI of 18.5 to 24.9 is normal.  A BMI of 25 to 29.9 is considered overweight.  A BMI of 30 and above is considered obese. Watch levels of cholesterol and blood lipids  You should start having your blood tested for lipids and cholesterol at 55 years of age, then have this test every 5 years.  You may need to have your cholesterol levels checked more often if:  Your lipid or cholesterol levels are high.  You are older than 55 years of age.  You are at high risk for heart disease. Cancer screening Lung Cancer  Lung cancer screening is recommended for adults 39-35 years old who are at high risk for lung cancer because of a history of smoking.  A yearly low-dose CT scan of the lungs is recommended for people who:  Currently smoke.  Have quit within the past 15 years.  Have at least a 30-pack-year history of smoking. A pack year is smoking an average of one pack of cigarettes a day for 1 year.  Yearly screening should continue until it has been 15 years since  you quit.  Yearly screening should stop if you develop a health problem that would prevent you from having lung cancer treatment. Breast Cancer  Practice breast self-awareness. This means understanding how your breasts normally appear and feel.  It also means doing regular breast self-exams. Let your health care provider know about any changes, no matter how small.  If you are in your 20s or 30s, you should have a clinical breast exam (CBE) by a health care provider every 1-3 years as part of a regular health exam.  If you are 75 or older, have a CBE every year. Also consider having a breast X-ray (mammogram) every year.  If you have a family history of breast cancer, talk to your health care provider about genetic screening.  If you are at high risk for breast cancer, talk to your health care provider about having an MRI and a mammogram every year.  Breast cancer gene (BRCA) assessment is recommended for women who have family members with BRCA-related cancers. BRCA-related cancers include:  Breast.  Ovarian.  Tubal.  Peritoneal  cancers.  Results of the assessment will determine the need for genetic counseling and BRCA1 and BRCA2 testing. Cervical Cancer  Your health care provider may recommend that you be screened regularly for cancer of the pelvic organs (ovaries, uterus, and vagina). This screening involves a pelvic examination, including checking for microscopic changes to the surface of your cervix (Pap test). You may be encouraged to have this screening done every 3 years, beginning at age 60.  For women ages 40-65, health care providers may recommend pelvic exams and Pap testing every 3 years, or they may recommend the Pap and pelvic exam, combined with testing for human papilloma virus (HPV), every 5 years. Some types of HPV increase your risk of cervical cancer. Testing for HPV may also be done on women of any age with unclear Pap test results.  Other health care providers  may not recommend any screening for nonpregnant women who are considered low risk for pelvic cancer and who do not have symptoms. Ask your health care provider if a screening pelvic exam is right for you.  If you have had past treatment for cervical cancer or a condition that could lead to cancer, you need Pap tests and screening for cancer for at least 20 years after your treatment. If Pap tests have been discontinued, your risk factors (such as having a new sexual partner) need to be reassessed to determine if screening should resume. Some women have medical problems that increase the chance of getting cervical cancer. In these cases, your health care provider may recommend more frequent screening and Pap tests. Colorectal Cancer  This type of cancer can be detected and often prevented.  Routine colorectal cancer screening usually begins at 55 years of age and continues through 55 years of age.  Your health care provider may recommend screening at an earlier age if you have risk factors for colon cancer.  Your health care provider may also recommend using home test kits to check for hidden blood in the stool.  A small camera at the end of a tube can be used to examine your colon directly (sigmoidoscopy or colonoscopy). This is done to check for the earliest forms of colorectal cancer.  Routine screening usually begins at age 61.  Direct examination of the colon should be repeated every 5-10 years through 55 years of age. However, you may need to be screened more often if early forms of precancerous polyps or small growths are found. Skin Cancer  Check your skin from head to toe regularly.  Tell your health care provider about any new moles or changes in moles, especially if there is a change in a mole's shape or color.  Also tell your health care provider if you have a mole that is larger than the size of a pencil eraser.  Always use sunscreen. Apply sunscreen liberally and repeatedly  throughout the day.  Protect yourself by wearing long sleeves, pants, a wide-brimmed hat, and sunglasses whenever you are outside. Heart disease, diabetes, and high blood pressure  High blood pressure causes heart disease and increases the risk of stroke. High blood pressure is more likely to develop in:  People who have blood pressure in the high end of the normal range (130-139/85-89 mm Hg).  People who are overweight or obese.  People who are African American.  If you are 40-32 years of age, have your blood pressure checked every 3-5 years. If you are 79 years of age or older, have your blood pressure checked  every year. You should have your blood pressure measured twice-once when you are at a hospital or clinic, and once when you are not at a hospital or clinic. Record the average of the two measurements. To check your blood pressure when you are not at a hospital or clinic, you can use:  An automated blood pressure machine at a pharmacy.  A home blood pressure monitor.  If you are between 55 years and 42 years old, ask your health care provider if you should take aspirin to prevent strokes.  Have regular diabetes screenings. This involves taking a blood sample to check your fasting blood sugar level.  If you are at a normal weight and have a low risk for diabetes, have this test once every three years after 55 years of age.  If you are overweight and have a high risk for diabetes, consider being tested at a younger age or more often. Preventing infection Hepatitis B  If you have a higher risk for hepatitis B, you should be screened for this virus. You are considered at high risk for hepatitis B if:  You were born in a country where hepatitis B is common. Ask your health care provider which countries are considered high risk.  Your parents were born in a high-risk country, and you have not been immunized against hepatitis B (hepatitis B vaccine).  You have HIV or AIDS.  You  use needles to inject street drugs.  You live with someone who has hepatitis B.  You have had sex with someone who has hepatitis B.  You get hemodialysis treatment.  You take certain medicines for conditions, including cancer, organ transplantation, and autoimmune conditions. Hepatitis C  Blood testing is recommended for:  Everyone born from [birth year] through 1964.  Anyone with known risk factors for hepatitis C. Sexually transmitted infections (STIs)  You should be screened for sexually transmitted infections (STIs) including gonorrhea and chlamydia if:  You are sexually active and are younger than 55 years of age.  You are older than 55 years of age and your health care provider tells you that you are at risk for this type of infection.  Your sexual activity has changed since you were last screened and you are at an increased risk for chlamydia or gonorrhea. Ask your health care provider if you are at risk.  If you do not have HIV, but are at risk, it may be recommended that you take a prescription medicine daily to prevent HIV infection. This is called pre-exposure prophylaxis (PrEP). You are considered at risk if:  You are sexually active and do not regularly use condoms or know the HIV status of your partner(s).  You take drugs by injection.  You are sexually active with a partner who has HIV. Talk with your health care provider about whether you are at high risk of being infected with HIV. If you choose to begin PrEP, you should first be tested for HIV. You should then be tested every 3 months for as long as you are taking PrEP. Pregnancy  If you are premenopausal and you may become pregnant, ask your health care provider about preconception counseling.  If you may become pregnant, take 400 to 800 micrograms (mcg) of folic acid every day.  If you want to prevent pregnancy, talk to your health care provider about birth control (contraception). Osteoporosis and  menopause  Osteoporosis is a disease in which the bones lose minerals and strength with aging. This can result in serious  bone fractures. Your risk for osteoporosis can be identified using a bone density scan.  If you are 57 years of age or older, or if you are at risk for osteoporosis and fractures, ask your health care provider if you should be screened.  Ask your health care provider whether you should take a calcium or vitamin D supplement to lower your risk for osteoporosis.  Menopause may have certain physical symptoms and risks.  Hormone replacement therapy may reduce some of these symptoms and risks. Talk to your health care provider about whether hormone replacement therapy is right for you. Follow these instructions at home:  Schedule regular health, dental, and eye exams.  Stay current with your immunizations.  Do not use any tobacco products including cigarettes, chewing tobacco, or electronic cigarettes.  If you are pregnant, do not drink alcohol.  If you are breastfeeding, limit how much and how often you drink alcohol.  Limit alcohol intake to no more than 1 drink per day for nonpregnant women. One drink equals 12 ounces of beer, 5 ounces of wine, or 1 ounces of hard liquor.  Do not use street drugs.  Do not share needles.  Ask your health care provider for help if you need support or information about quitting drugs.  Tell your health care provider if you often feel depressed.  Tell your health care provider if you have ever been abused or do not feel safe at home. This information is not intended to replace advice given to you by your health care provider. Make sure you discuss any questions you have with your health care provider. Document Released: 08/06/2010 Document Revised: 06/29/2015 Document Reviewed: 10/25/2014  2017 [employer]vier

## 2016-03-12 NOTE — Assessment & Plan Note (Signed)
Colonoscopy up to date. Sees gyn for pap and mammogram. Checking labs today. Rx for belviq to help with weight. Counseled on dangers of distracted driving and sun safety as well as mole surveillance. Given screening recommendations.

## 2016-03-13 DIAGNOSIS — Z01419 Encounter for gynecological examination (general) (routine) without abnormal findings: Secondary | ICD-10-CM | POA: Diagnosis not present

## 2016-03-27 ENCOUNTER — Encounter: Payer: 59 | Admitting: Internal Medicine

## 2016-04-03 ENCOUNTER — Ambulatory Visit (INDEPENDENT_AMBULATORY_CARE_PROVIDER_SITE_OTHER): Payer: 59 | Admitting: Physician Assistant

## 2016-04-03 VITALS — BP 132/78 | HR 90 | Temp 98.3°F | Resp 18 | Ht 66.0 in | Wt 243.0 lb

## 2016-04-03 DIAGNOSIS — K219 Gastro-esophageal reflux disease without esophagitis: Secondary | ICD-10-CM

## 2016-04-03 DIAGNOSIS — R079 Chest pain, unspecified: Secondary | ICD-10-CM | POA: Diagnosis not present

## 2016-04-03 MED ORDER — RANITIDINE HCL 150 MG PO TABS: 150.0000 mg | ORAL_TABLET | Freq: Two times a day (BID) | ORAL | 1 refills | Status: DC

## 2016-04-03 MED ORDER — GI COCKTAIL ~~LOC~~
30.0000 mL | Freq: Once | ORAL | Status: AC
  Administered 2016-04-03: 30 mL via ORAL

## 2016-04-03 MED ORDER — OMEPRAZOLE 20 MG PO CPDR: 20.0000 mg | DELAYED_RELEASE_CAPSULE | Freq: Every day | ORAL | 1 refills | Status: DC

## 2016-04-03 NOTE — Progress Notes (Deleted)
PRIMARY CARE AT [place], [place] [address] [phone]  Date:  04/03/2016   Name:  Ruwayda Balingit Boozer                     DOB:  Jun 17, 1960                   MRN:  [id number]  PCP:  Hoyt Koch, MD    History of Present Illness:  Riti Amon Korte is a non-smoking 56 y.o. female patient who presents to [facility] for cc of heartburn that has started last night.  Center of chest described as burning and crushing.  Feels like burning. Pain feels like it radiates up and down the chest. She has been eating a lot of dark chocolate.  She was at rest when pain started.  Pain does not worsen with exertion.  No nausea, sob, diaphoresis, blurry vision, or dizziness.  Can last for hours.  Similar pain occurred last week after eating.  This lasted a few days and resolved completely. No hx of htn.   44 year old mother passed from what was thought to be an MI and seizure.   She took milk of magnesia and it had resolved last week.       Patient Active Problem List   Diagnosis Date Noted  . Elevated blood pressure reading in office without diagnosis of hypertension 03/12/2016  . Routine general medical examination at a health care facility 09/29/2014  . GERD (gastroesophageal reflux disease)   . Obesity 04/18/2009        Past Medical History:  Diagnosis Date  . GERD (gastroesophageal reflux disease)   . HYPERGLYCEMIA   . OBESITY          Past Surgical History:  Procedure Laterality Date  . CESAREAN SECTION  1992  . LAPAROSCOPIC CHOLECYSTECTOMY  2005           Social History   Substance Use Topics   . Smoking status: Never Smoker   . 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 Alcohol use 1.2 oz/week    2 Glasses of wine per week         Family History  Problem Relation Age of Onset  . Prostate cancer Father   . Hypertension Brother   . Kidney disease  Brother   . Stroke Maternal Grandmother   . Colon cancer Neg Hx   . Stomach cancer Neg Hx     No Known Allergies  Medication list has been reviewed and updated.  Current Outpatient Prescriptions on File Prior to Visit  Medication Sig Dispense Refill  . aspirin EC 81 MG tablet Take 81 mg by mouth daily.    . Calcium Carb-Cholecalciferol (CALCIUM-VITAMIN D) 500-200 MG-UNIT tablet Take 1 tablet by mouth daily.    . Multiple Vitamins-Calcium (ONE-A-DAY WOMENS FORMULA) TABS Take by mouth daily. Reported on 02/02/2015    . Omega-3 Fatty Acids (FISH OIL) 1000 MG CAPS Take by mouth.    . vitamin E 400 UNIT capsule Take 400 Units by mouth daily.    . Lorcaserin HCl ER (BELVIQ XR) 20 MG TB24 Take 20 mg by mouth daily. (Patient not taking: Reported on 04/03/2016) 30 tablet 5   No current facility-administered medications on file prior to visit.     ROS ROS otherwise unremarkable unless listed above.  Physical Examination: BP  132/78 (BP Location: Right Arm, Patient Position: Sitting, Cuff Size: Large)   Pulse 90   Temp 98.3 F (36.8 C) (Oral)   Resp 18   Ht 5\' 6"  (1.676 m)   Wt 243 lb (110.2 kg)   LMP 11/22/2011   SpO2 98%   BMI 39.22 kg/m  Ideal Body Weight: Weight in (lb) to have BMI = 25: 154.6  Physical Exam  Constitutional: She is oriented to person, place, and time. She appears well-developed and well-nourished. No distress.  HENT:  Head: Normocephalic and atraumatic.  Right Ear: External ear normal.  Left Ear: External ear normal.  Eyes: Conjunctivae and EOM are normal. Pupils are equal, round, and reactive to light.  Cardiovascular: Normal rate and regular rhythm.  Exam reveals no friction rub.   No murmur heard. Pulses:      Radial pulses are 2+ on the right side, and 2+ on the left side.       Dorsalis pedis pulses are 2+ on the right side, and 2+ on the left side.  Pulmonary/Chest: Effort normal and breath sounds normal. No apnea. No respiratory  distress. She has no decreased breath sounds. She has no wheezes.  Neurological: She is alert and oriented to person, place, and time.  Skin: She is not diaphoretic.  Psychiatric: She has a normal mood and affect. Her behavior is normal.   ekg III, inverted t wave, however this is present    Assessment and Plan: Renna Demark Mazur is a 56 y.o. female who is here today for chest pain. --ekg unremarkable as well as vit als --gi cocktail with some improvement of symptoms --advised alarming sxs to warrant immediate return. --follow up in 2-4 weeks regarding heartburn. [facility] [address], [place] [address] [phone]  Date:  04/03/2016   Name:  Cleve Llaguno Flinders   DOB:  Dec 07, 1960   MRN:  [id number]  PCP:  Hoyt Koch, MD    History of Present Illness:  Harvest Blevens Creedon is a 56 y.o. female patient who presents to [facility]     Patient Active Problem List   Diagnosis Date Noted  . Elevated blood pressure reading in office without diagnosis of hypertension 03/12/2016  . Routine general medical examination at a health care facility 09/29/2014  . GERD (gastroesophageal reflux disease)   . Obesity 04/18/2009    Past Medical History:  Diagnosis Date  . GERD (gastroesophageal reflux disease)   . HYPERGLYCEMIA   . OBESITY     Past Surgical History:  Procedure Laterality Date  . CESAREAN SECTION  1992  . LAPAROSCOPIC CHOLECYSTECTOMY  2005    Social History  Substance Use Topics  . Smoking status: Never Smoker  . 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 Alcohol use 1.2 oz/week    2 Glasses of wine per week    Family History  Problem Relation Age of Onset  . Prostate cancer Father   . Hypertension Brother   . Kidney disease Brother   . Stroke Maternal Grandmother   . Colon cancer Neg Hx   . Stomach cancer Neg Hx     No Known Allergies  Medication list has  been reviewed and updated.  Current Outpatient Prescriptions on File Prior to Visit  Medication Sig Dispense Refill  . aspirin EC 81 MG tablet Take 81 mg by mouth daily.    . Calcium Carb-Cholecalciferol (  CALCIUM-VITAMIN D) 500-200 MG-UNIT tablet Take 1 tablet by mouth daily.    . Multiple Vitamins-Calcium (ONE-A-DAY WOMENS FORMULA) TABS Take by mouth daily. Reported on 02/02/2015    . Omega-3 Fatty Acids (FISH OIL) 1000 MG CAPS Take by mouth.    . vitamin E 400 UNIT capsule Take 400 Units by mouth daily.    . Lorcaserin HCl ER (BELVIQ XR) 20 MG TB24 Take 20 mg by mouth daily. (Patient not taking: Reported on 04/03/2016) 30 tablet 5   No current facility-administered medications on file prior to visit.     ROS   Physical Examination: BP 132/78 (BP Location: Right Arm, Patient Position: Sitting, Cuff Size: Large)   Pulse 90   Temp 98.3 F (36.8 C) (Oral)   Resp 18   Ht 5\' 6"  (1.676 m)   Wt 243 lb (110.2 kg)   LMP 11/22/2011   SpO2 98%   BMI 39.22 kg/m  Ideal Body Weight: Weight in (lb) to have BMI = 25: 154.6  Physical Exam   Assessment and Plan: Genya Marek Scogin is a 56 y.o. female who is here today for cc of heartburn. --reflux meds  1. Chest pain, unspecified type - EKG 12-Lead - gi cocktail (Maalox,Lidocaine,Donnatal); Take 30 mLs by mouth once.  2. Gastroesophageal reflux disease, esophagitis presence not specified - omeprazole (PRILOSEC) 20 MG capsule; Take 1 capsule (20 mg total) by mouth daily.  Dispense: 30 capsule; Refill: 1 - ranitidine (ZANTAC) 150 MG tablet; Take 1 tablet (150 mg total) by mouth 2 (two) times daily.  Dispense: 60 tablet; Refill: 1   Ivar Drape, PA-C [facility] 04/03/2016 2:13 PM

## 2016-04-03 NOTE — Progress Notes (Deleted)
Isabel [name] is a non-smoking 56 y.o. female patient who presents to [facility] for cc of heartburn that has started last night.  Center of chest described as burning and crushing.  Feels like burning. Pain feels like it radiates up and down the chest. She has been eating a lot of dark chocolate.  She was at rest when pain started.  Pain does not worsen with exertion.  No nausea, sob, diaphoresis, blurry vision, or dizziness.  Can last for hours.  Similar pain occurred last week after eating.  This lasted a few days and resolved completely. No hx of htn.   79 year old mother passed from what was thought to be an MI and seizure.   She took milk of magnesia and it had resolved last week.

## 2016-04-03 NOTE — Progress Notes (Signed)
PRIMARY CARE AT [place], [place] [address] [phone]  Date:  04/03/2016   Name:  Sheylin [name] Isabel                     DOB:  18-Jun-1960                   MRN:  [id number]  PCP:  Hoyt Koch, MD    History of Present Illness:  Isabel [name] is a non-smoking 56 y.o. female patient who presents to [facility] for cc of heartburn that has started last night.  Center of chest described as burning and crushing.  Feels like burning. Pain feels like it radiates up and down the chest. She has been eating a lot of dark chocolate.  She was at rest when pain started.  Pain does not worsen with exertion.  No nausea, sob, diaphoresis, blurry vision, or dizziness.  Can last for hours.  Similar pain occurred last week after eating.  This lasted a few days and resolved completely. No hx of htn.   46 year old mother passed from what was thought to be an MI and seizure.   She took milk of magnesia and it had resolved last week.       Patient Active Problem List   Diagnosis Date Noted  . Elevated blood pressure reading in office without diagnosis of hypertension 03/12/2016  . Routine general medical examination at a health care facility 09/29/2014  . GERD (gastroesophageal reflux disease)   . Obesity 04/18/2009        Past Medical History:  Diagnosis Date  . GERD (gastroesophageal reflux disease)   . HYPERGLYCEMIA   . OBESITY          Past Surgical History:  Procedure Laterality Date  . CESAREAN SECTION  1992  . LAPAROSCOPIC CHOLECYSTECTOMY  2005           Social History   Substance Use Topics   . Smoking status: Never Smoker   . 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 Alcohol use 1.2 oz/week    2 Glasses of wine per week         Family History  Problem Relation Age of Onset  . Prostate cancer Father   . Hypertension Brother   . Kidney disease  Brother   . Stroke Maternal Grandmother   . Colon cancer Neg Hx   . Stomach cancer Neg Hx     No Known Allergies  Medication list has been reviewed and updated.        Current Outpatient Prescriptions on File Prior to Visit  Medication Sig Dispense Refill  . aspirin EC 81 MG tablet Take 81 mg by mouth daily.    . Calcium Carb-Cholecalciferol (CALCIUM-VITAMIN D) 500-200 MG-UNIT tablet Take 1 tablet by mouth daily.    . Multiple Vitamins-Calcium (ONE-A-DAY WOMENS FORMULA) TABS Take by mouth daily. Reported on 02/02/2015    . Omega-3 Fatty Acids (FISH OIL) 1000 MG CAPS Take by mouth.    . vitamin E 400 UNIT capsule Take 400 Units by mouth daily.    . Lorcaserin HCl ER (BELVIQ XR) 20 MG TB24 Take 20 mg by mouth daily. (Patient not taking: Reported on 04/03/2016) 30 tablet 5   No current facility-administered medications on file prior to visit.     ROS ROS otherwise unremarkable unless  listed above.  Physical Examination: BP 132/78 (BP Location: Right Arm, Patient Position: Sitting, Cuff Size: Large)   Pulse 90   Temp 98.3 F (36.8 C) (Oral)   Resp 18   Ht 5\' 6"  (1.676 m)   Wt 243 lb (110.2 kg)   LMP 11/22/2011   SpO2 98%   BMI 39.22 kg/m  Ideal Body Weight: Weight in (lb) to have BMI = 25: 154.6  Physical Exam  Constitutional: She is oriented to person, place, and time. She appears well-developed and well-nourished. No distress.  HENT:  Head: Normocephalic and atraumatic.  Right Ear: External ear normal.  Left Ear: External ear normal.  Eyes: Conjunctivae and EOM are normal. Pupils are equal, round, and reactive to light.  Cardiovascular: Normal rate and regular rhythm.  Exam reveals no friction rub.   No murmur heard. Pulses:      Radial pulses are 2+ on the right side, and 2+ on the left side.       Dorsalis pedis pulses are 2+ on the right side, and 2+ on the left side.  Pulmonary/Chest: Effort normal and breath sounds normal. No apnea. No  respiratory distress. She has no decreased breath sounds. She has no wheezes.  Neurological: She is alert and oriented to person, place, and time.  Skin: She is not diaphoretic.  Psychiatric: She has a normal mood and affect. Her behavior is normal.   ekg III, inverted t wave, however this is present    Assessment and Plan: Isabel [name] is a 56 y.o. female who is here today for chest pain. --ekg unremarkable as well as vit als --gi cocktail with some improvement of symptoms --advised alarming sxs to warrant immediate return. --follow up in 2-4 weeks regarding heartburn. Chest pain, unspecified type - Plan: EKG 12-Lead, gi cocktail (Maalox,Lidocaine,Donnatal)  Gastroesophageal reflux disease, esophagitis presence not specified - Plan: omeprazole (PRILOSEC) 20 MG capsule, ranitidine (ZANTAC) 150 MG tablet  Ivar Drape, PA-C [facility] 2/28/20182:16 PM

## 2016-04-03 NOTE — Patient Instructions (Addendum)
Please take the medication for 2 weeks.  You will use it up to 4 weeks-8 weeks, but I would like to see that this has improved with a 4 week follow up. Please review the foods to avoid during this time, and other lifestyle choices.  Food Choices for Gastroesophageal Reflux Disease, Adult When you have gastroesophageal reflux disease (GERD), the foods you eat and your eating habits are very important. Choosing the right foods can help ease your discomfort. What guidelines do I need to follow?  Choose fruits, vegetables, whole grains, and low-fat dairy products.  Choose low-fat meat, fish, and poultry.  Limit fats such as oils, salad dressings, butter, nuts, and avocado.  Keep a food diary. This helps you identify foods that cause symptoms.  Avoid foods that cause symptoms. These may be different for everyone.  Eat small meals often instead of 3 large meals a day.  Eat your meals slowly, in a place where you are relaxed.  Limit fried foods.  Cook foods using methods other than frying.  Avoid drinking alcohol.  Avoid drinking large amounts of liquids with your meals.  Avoid bending over or lying down until 2-3 hours after eating. What foods are not recommended? These are some foods and drinks that may make your symptoms worse: Vegetables  Tomatoes. Tomato juice. Tomato and spaghetti sauce. Chili peppers. Onion and garlic. Horseradish. Fruits  Oranges, grapefruit, and lemon (fruit and juice). Meats  High-fat meats, fish, and poultry. This includes hot dogs, ribs, ham, sausage, salami, and bacon. Dairy  Whole milk and chocolate milk. Sour cream. Cream. Butter. Ice cream. Cream cheese. Drinks  Coffee and tea. Bubbly (carbonated) drinks or energy drinks. Condiments  Hot sauce. Barbecue sauce. Sweets/Desserts  Chocolate and cocoa. Donuts. Peppermint and spearmint. Fats and Oils  High-fat foods. This includes [place] fries and potato chips. Other  Vinegar. Strong spices. This  includes black pepper, white pepper, red pepper, cayenne, curry powder, cloves, ginger, and chili powder. The items listed above may not be a complete list of foods and drinks to avoid. Contact your dietitian for more information.  This information is not intended to replace advice given to you by your health care provider. Make sure you discuss any questions you have with your health care provider. Document Released: 07/23/2011 Document Revised: 06/29/2015 Document Reviewed: 11/25/2012 Elsevier Interactive Patient Education  2017 [employer].    IF you received an x-ray today, you will receive an invoice from [facility]. Please contact [facility] at [phone] with questions or concerns regarding your invoice.   IF you received labwork today, you will receive an invoice from [facility]. Please contact [facility] at [phone] with questions or concerns regarding your invoice.   Our billing staff will not be able to assist you with questions regarding bills from these companies.  You will be contacted with the lab results as soon as they are available. The fastest way to get your results is to activate your My Chart account. Instructions are located on the last page of this paperwork. If you have not heard from [place] regarding the results in 2 weeks, please contact this office.

## 2016-04-18 DIAGNOSIS — E119 Type 2 diabetes mellitus without complications: Secondary | ICD-10-CM | POA: Diagnosis not present

## 2016-04-18 LAB — HM DIABETES EYE EXAM

## 2016-04-19 ENCOUNTER — Encounter: Payer: Self-pay | Admitting: Internal Medicine

## 2016-04-19 NOTE — Progress Notes (Unsigned)
Results entered and sent to scan  

## 2016-08-21 ENCOUNTER — Encounter: Payer: Self-pay | Admitting: Physician Assistant

## 2016-08-21 ENCOUNTER — Ambulatory Visit (INDEPENDENT_AMBULATORY_CARE_PROVIDER_SITE_OTHER): Payer: 59 | Admitting: Physician Assistant

## 2016-08-21 VITALS — BP 144/76 | HR 101 | Temp 97.9°F | Resp 16 | Ht 66.0 in | Wt 245.2 lb

## 2016-08-21 DIAGNOSIS — R059 Cough, unspecified: Secondary | ICD-10-CM

## 2016-08-21 DIAGNOSIS — R05 Cough: Secondary | ICD-10-CM

## 2016-08-21 DIAGNOSIS — J019 Acute sinusitis, unspecified: Secondary | ICD-10-CM | POA: Diagnosis not present

## 2016-08-21 MED ORDER — HYDROCODONE-HOMATROPINE 5-1.5 MG/5ML PO SYRP: 5.0000 mL | ORAL_SOLUTION | Freq: Three times a day (TID) | ORAL | 0 refills | Status: DC | PRN

## 2016-08-21 MED ORDER — AMOXICILLIN-POT CLAVULANATE 875-125 MG PO TABS: 1.0000 | ORAL_TABLET | Freq: Two times a day (BID) | ORAL | 0 refills | Status: DC

## 2016-08-21 MED ORDER — BENZONATATE 100 MG PO CAPS: 100.0000 mg | ORAL_CAPSULE | Freq: Three times a day (TID) | ORAL | 0 refills | Status: DC | PRN

## 2016-08-21 NOTE — Progress Notes (Signed)
Isabel [name]  MRN: [id number] DOB: 04/22/1960  PCP: Hoyt Koch, MD  Subjective:  Pt is a pleasant 56 year old female PMH GERD, obesity who presents to clinic for sinus pressure and cough x 2 weeks. Endorses runny nose last week - this has improved, however her sinus pressure is worsening. Cough is keeping her up at night.  She has taken Alkaseltzer plus, sudafed and Mucinex - this helped some. Denies fever, chills, body aches, n/v, chest pain, palpitations, chest tightness, shob.     Review of Systems  Constitutional: Negative for chills, diaphoresis, fatigue and fever.  HENT: Positive for congestion, postnasal drip, rhinorrhea and sinus pressure. Negative for sneezing and sore throat.   Respiratory: Positive for cough. Negative for chest tightness, shortness of breath and wheezing.   Cardiovascular: Negative for chest pain and palpitations.  Neurological: Negative for weakness, light-headedness and headaches.  Psychiatric/Behavioral: Positive for sleep disturbance.    Patient Active Problem List   Diagnosis Date Noted  . Elevated blood pressure reading in office without diagnosis of hypertension 03/12/2016  . Routine general medical examination at a health care facility 09/29/2014  . GERD (gastroesophageal reflux disease)   . Obesity 04/18/2009    Current Outpatient Prescriptions on File Prior to Visit  Medication Sig Dispense Refill  . aspirin EC 81 MG tablet Take 81 mg by mouth daily.    . Calcium Carb-Cholecalciferol (CALCIUM-VITAMIN D) 500-200 MG-UNIT tablet Take 1 tablet by mouth daily.    . Multiple Vitamins-Calcium (ONE-A-DAY WOMENS FORMULA) TABS Take by mouth daily. Reported on 02/02/2015    . Omega-3 Fatty Acids (FISH OIL) 1000 MG CAPS Take by mouth.    . vitamin E 400 UNIT capsule Take 400 Units by mouth daily.    . Lorcaserin HCl ER (BELVIQ XR) 20 MG TB24 Take 20 mg by mouth daily. (Patient not taking: Reported on 04/03/2016) 30 tablet 5  . omeprazole  (PRILOSEC) 20 MG capsule Take 1 capsule (20 mg total) by mouth daily. (Patient not taking: Reported on 08/21/2016) 30 capsule 1  . ranitidine (ZANTAC) 150 MG tablet Take 1 tablet (150 mg total) by mouth 2 (two) times daily. (Patient not taking: Reported on 08/21/2016) 60 tablet 1   No current facility-administered medications on file prior to visit.     No Known Allergies   Objective:  BP (!) 144/76   Pulse (!) 101   Temp 97.9 F (36.6 C) (Oral)   Resp 16   Ht 5\' 6"  (1.676 m)   Wt 245 lb 3.2 oz (111.2 kg)   LMP 11/22/2011   SpO2 98%   BMI 39.58 kg/m   Physical Exam  Constitutional: She is oriented to person, place, and time and well-developed, well-nourished, and in no distress. No distress.  HENT:  Right Ear: Tympanic membrane is bulging.  Nose: Right sinus exhibits maxillary sinus tenderness. Right sinus exhibits no frontal sinus tenderness. Left sinus exhibits maxillary sinus tenderness. Left sinus exhibits no frontal sinus tenderness.  Mouth/Throat: Oropharynx is clear and moist and mucous membranes are normal.  Eyes: Conjunctivae are normal.  Cardiovascular: Normal rate, regular rhythm and normal heart sounds.   Neurological: She is alert and oriented to person, place, and time. GCS score is 15.  Skin: Skin is warm and dry.  Psychiatric: Mood, memory, affect and judgment normal.  Vitals reviewed.   Assessment and Plan :  1. Subacute sinusitis, unspecified location - amoxicillin-clavulanate (AUGMENTIN) 875-125 MG tablet; Take 1 tablet by mouth 2 (two) times  daily.  Dispense: 20 tablet; Refill: 0 - Supportive care encouraged: push fluids and rest. RTC in 5-7 days if no improvement.  2. Cough - HYDROcodone-homatropine (HYCODAN) 5-1.5 MG/5ML syrup; Take 5 mLs by mouth every 8 (eight) hours as needed for cough.  Dispense: 120 mL; Refill: 0 - benzonatate (TESSALON) 100 MG capsule; Take 1-2 capsules (100-200 mg total) by mouth 3 (three) times daily as needed for cough.  Dispense:  40 capsule; Refill: 0  Mercer Pod, PA-C  [facility] [place] 08/21/2016 9:47 AM

## 2016-08-21 NOTE — Patient Instructions (Addendum)
Continue drinking plenty of fluids. Warm tea with honey and lemon will help with your cough.  Get plenty of rest.  Take the entire course of your antibiotics, even if you start to feel better.  Hycodan is a hydrocodone-based cough syrup. Please take this as directed.  Come back in 5-7 days if you are not improving.   Thank you for coming in today. I hope you feel we met your needs.  Feel free to call PCP if you have any questions or further requests.  Please consider signing up for MyChart if you do not already have it, as this is a great way to communicate with me.  Best,  Whitney McVey, PA-C   IF you received an x-ray today, you will receive an invoice from [facility]. Please contact [facility] at [phone] with questions or concerns regarding your invoice.   IF you received labwork today, you will receive an invoice from [place] [facility]. Please contact [facility] at [phone] with questions or concerns regarding your invoice.   Our billing staff will not be able to assist you with questions regarding bills from these companies.  You will be contacted with the lab results as soon as they are available. The fastest way to get your results is to activate your My Chart account. Instructions are located on the last page of this paperwork. If you have not heard from [place] regarding the results in 2 weeks, please contact this office.

## 2017-02-06 ENCOUNTER — Other Ambulatory Visit: Payer: Self-pay | Admitting: Obstetrics and Gynecology

## 2017-02-06 DIAGNOSIS — Z1231 Encounter for screening mammogram for malignant neoplasm of breast: Secondary | ICD-10-CM

## 2017-03-05 ENCOUNTER — Ambulatory Visit
Admission: RE | Admit: 2017-03-05 | Discharge: 2017-03-05 | Disposition: A | Discharge status: Home / Self Care | Payer: 59 | Source: Ambulatory Visit | Attending: Obstetrics and Gynecology | Admitting: Obstetrics and Gynecology

## 2017-03-05 DIAGNOSIS — Z1231 Encounter for screening mammogram for malignant neoplasm of breast: Secondary | ICD-10-CM

## 2017-03-14 ENCOUNTER — Encounter: Payer: Self-pay | Admitting: Internal Medicine

## 2017-03-14 ENCOUNTER — Other Ambulatory Visit (INDEPENDENT_AMBULATORY_CARE_PROVIDER_SITE_OTHER): Payer: 59

## 2017-03-14 ENCOUNTER — Other Ambulatory Visit: Payer: 59

## 2017-03-14 ENCOUNTER — Ambulatory Visit (INDEPENDENT_AMBULATORY_CARE_PROVIDER_SITE_OTHER): Payer: 59 | Admitting: Internal Medicine

## 2017-03-14 VITALS — BP 124/78 | HR 93 | Temp 98.1°F | Ht 66.0 in | Wt 244.0 lb

## 2017-03-14 DIAGNOSIS — Z0000 Encounter for general adult medical examination without abnormal findings: Secondary | ICD-10-CM

## 2017-03-14 DIAGNOSIS — Z23 Encounter for immunization: Secondary | ICD-10-CM | POA: Diagnosis not present

## 2017-03-14 DIAGNOSIS — R030 Elevated blood-pressure reading, without diagnosis of hypertension: Secondary | ICD-10-CM

## 2017-03-14 DIAGNOSIS — K219 Gastro-esophageal reflux disease without esophagitis: Secondary | ICD-10-CM | POA: Diagnosis not present

## 2017-03-14 LAB — CBC WITH DIFFERENTIAL/PLATELET
BASOS ABS: 48 {cells}/uL (ref 0–200)
Basophils Relative: 0.7 %
EOS ABS: 159 {cells}/uL (ref 15–500)
Eosinophils Relative: 2.3 %
HEMATOCRIT: 42.5 % (ref 35.0–45.0)
Hemoglobin: 14.2 g/dL (ref 11.7–15.5)
Lymphs Abs: 2098 cells/uL (ref 850–3900)
MCH: 27.4 pg (ref 27.0–33.0)
MCHC: 33.4 g/dL (ref 32.0–36.0)
MCV: 81.9 fL (ref 80.0–100.0)
MONOS PCT: 10.5 %
MPV: 11.1 fL (ref 7.5–12.5)
Neutro Abs: 3871 cells/uL (ref 1500–7800)
Neutrophils Relative %: 56.1 %
Platelets: 332 10*3/uL (ref 140–400)
RBC: 5.19 10*6/uL — ABNORMAL HIGH (ref 3.80–5.10)
RDW: 13.3 % (ref 11.0–15.0)
Total Lymphocyte: 30.4 %
WBC: 6.9 10*3/uL (ref 3.8–10.8)
WBCMIX: 725 {cells}/uL (ref 200–950)

## 2017-03-14 LAB — COMPREHENSIVE METABOLIC PANEL
ALK PHOS: 47 U/L (ref 39–117)
ALT: 37 U/L — ABNORMAL HIGH (ref 0–35)
AST: 28 U/L (ref 0–37)
Albumin: 4.3 g/dL (ref 3.5–5.2)
BUN: 11 mg/dL (ref 6–23)
CO2: 30 meq/L (ref 19–32)
Calcium: 9.9 mg/dL (ref 8.4–10.5)
Chloride: 102 mEq/L (ref 96–112)
Creatinine, Ser: 0.76 mg/dL (ref 0.40–1.20)
GFR: 101.5 mL/min (ref >60.00)
GLUCOSE: 94 mg/dL (ref 70–99)
POTASSIUM: 4.2 meq/L (ref 3.5–5.1)
Sodium: 137 mEq/L (ref 135–145)
TOTAL PROTEIN: 7.9 g/dL (ref 6.0–8.3)
Total Bilirubin: 0.3 mg/dL (ref 0.2–1.2)

## 2017-03-14 LAB — LIPID PANEL
Cholesterol: 126 mg/dL (ref 0–200)
HDL: 38.6 mg/dL — AB (ref >39.00)
LDL Cholesterol: 65 mg/dL (ref 0–99)
NONHDL: 87.1
TRIGLYCERIDES: 111 mg/dL (ref 0.0–149.0)
Total CHOL/HDL Ratio: 3
VLDL: 22.2 mg/dL (ref 0.0–40.0)

## 2017-03-14 LAB — HEMOGLOBIN A1C: Hgb A1c MFr Bld: 6 % (ref 4.6–6.5)

## 2017-03-14 MED ORDER — LORCASERIN HCL ER 20 MG PO TB24: 20.0000 mg | ORAL_TABLET | Freq: Every day | ORAL | 5 refills | Status: DC

## 2017-03-14 NOTE — Assessment & Plan Note (Signed)
Mammogram and colonoscopy up to date. Added to shringrix waiting list. Flu and tetanus given at visit. Declines HIV screening. Counseled about diet and exercise changes for her health. Given screening recommendations.

## 2017-03-14 NOTE — Progress Notes (Signed)
   Subjective:    Patient ID: Isabel [name], female    DOB: September 25, 1960, 57 y.o.   MRN: [id number]  HPI The patient is a 57 YO female coming in for physical. No new concerns.  PMH, [facility], social history reviewed and updated.   Review of Systems  Constitutional: Negative.   HENT: Negative.   Eyes: Negative.   Respiratory: Negative for cough, chest tightness and shortness of breath.   Cardiovascular: Negative for chest pain, palpitations and leg swelling.  Gastrointestinal: Negative for abdominal distention, abdominal pain, constipation, diarrhea, nausea and vomiting.  Musculoskeletal: Negative.   Skin: Negative.   Neurological: Negative.   Psychiatric/Behavioral: Negative.       Objective:   Physical Exam  Constitutional: She is oriented to person, place, and time. She appears well-developed and well-nourished.  Overweight  HENT:  Head: Normocephalic and atraumatic.  Eyes: EOM are normal.  Neck: Normal range of motion.  Cardiovascular: Normal rate and regular rhythm.  Pulmonary/Chest: Effort normal and breath sounds normal. No respiratory distress. She has no wheezes. She has no rales.  Abdominal: Soft. Bowel sounds are normal. She exhibits no distension. There is no tenderness. There is no rebound.  Musculoskeletal: She exhibits no edema.  Neurological: She is alert and oriented to person, place, and time. Coordination normal.  Skin: Skin is warm and dry.  Psychiatric: She has a normal mood and affect.   Vitals:   03/14/17 0802  BP: 124/78  Pulse: 93  Temp: 98.1 F (36.7 C)  TempSrc: Oral  SpO2: 98%  Weight: 244 lb (110.7 kg)  Height: 5\' 6"  (1.676 m)      Assessment & Plan:  Flu and tdap given at visit

## 2017-03-14 NOTE — Patient Instructions (Signed)
We have sent in the weight medicine called belviq to take daily.   We are checking the labs today.  Health Maintenance, Female Adopting a healthy lifestyle and getting preventive care can go a long way to promote health and wellness. Talk with your health care provider about what schedule of regular examinations is right for you. This is a good chance for you to check in with your provider about disease prevention and staying healthy. In between checkups, there are plenty of things you can do on your own. Experts have done a lot of research about which lifestyle changes and preventive measures are most likely to keep you healthy. Ask your health care provider for more information. Weight and diet Eat a healthy diet  Be sure to include plenty of vegetables, fruits, low-fat dairy products, and lean protein.  Do not eat a lot of foods high in solid fats, added sugars, or salt.  Get regular exercise. This is one of the most important things you can do for your health. ? Most adults should exercise for at least 150 minutes each week. The exercise should increase your heart rate and make you sweat (moderate-intensity exercise). ? Most adults should also do strengthening exercises at least twice a week. This is in addition to the moderate-intensity exercise.  Maintain a healthy weight  Body mass index (BMI) is a measurement that can be used to identify possible weight problems. It estimates body fat based on height and weight. Your health care provider can help determine your BMI and help you achieve or maintain a healthy weight.  For females 56 years of age and older: ? A BMI below 18.5 is considered underweight. ? A BMI of 18.5 to 24.9 is normal. ? A BMI of 25 to 29.9 is considered overweight. ? A BMI of 30 and above is considered obese.  Watch levels of cholesterol and blood lipids  You should start having your blood tested for lipids and cholesterol at 56 years of age, then have this test  every 5 years.  You may need to have your cholesterol levels checked more often if: ? Your lipid or cholesterol levels are high. ? You are older than 56 years of age. ? You are at high risk for heart disease.  Cancer screening Lung Cancer  Lung cancer screening is recommended for adults 56-56 years old who are at high risk for lung cancer because of a history of smoking.  A yearly low-dose CT scan of the lungs is recommended for people who: ? Currently smoke. ? Have quit within the past 15 years. ? Have at least a 30-pack-year history of smoking. A pack year is smoking an average of one pack of cigarettes a day for 1 year.  Yearly screening should continue until it has been 15 years since you quit.  Yearly screening should stop if you develop a health problem that would prevent you from having lung cancer treatment.  Breast Cancer  Practice breast self-awareness. This means understanding how your breasts normally appear and feel.  It also means doing regular breast self-exams. Let your health care provider know about any changes, no matter how small.  If you are in your 56s or 56s, you should have a clinical breast exam (CBE) by a health care provider every 1-3 years as part of a regular health exam.  If you are 56 or older, have a CBE every year. Also consider having a breast X-ray (mammogram) every year.  If you have a  family history of breast cancer, talk to your health care provider about genetic screening.  If you are at high risk for breast cancer, talk to your health care provider about having an MRI and a mammogram every year.  Breast cancer gene (BRCA) assessment is recommended for women who have family members with BRCA-related cancers. BRCA-related cancers include: ? Breast. ? Ovarian. ? Tubal. ? Peritoneal cancers.  Results of the assessment will determine the need for genetic counseling and BRCA1 and BRCA2 testing.  Cervical Cancer Your health care provider  may recommend that you be screened regularly for cancer of the pelvic organs (ovaries, uterus, and vagina). This screening involves a pelvic examination, including checking for microscopic changes to the surface of your cervix (Pap test). You may be encouraged to have this screening done every 3 years, beginning at age 56.  For women ages 56-56, health care providers may recommend pelvic exams and Pap testing every 3 years, or they may recommend the Pap and pelvic exam, combined with testing for human papilloma virus (HPV), every 5 years. Some types of HPV increase your risk of cervical cancer. Testing for HPV may also be done on women of any age with unclear Pap test results.  Other health care providers may not recommend any screening for nonpregnant women who are considered low risk for pelvic cancer and who do not have symptoms. Ask your health care provider if a screening pelvic exam is right for you.  If you have had past treatment for cervical cancer or a condition that could lead to cancer, you need Pap tests and screening for cancer for at least 20 years after your treatment. If Pap tests have been discontinued, your risk factors (such as having a new sexual partner) need to be reassessed to determine if screening should resume. Some women have medical problems that increase the chance of getting cervical cancer. In these cases, your health care provider may recommend more frequent screening and Pap tests.  Colorectal Cancer  This type of cancer can be detected and often prevented.  Routine colorectal cancer screening usually begins at 56 years of age and continues through 56 years of age.  Your health care provider may recommend screening at an earlier age if you have risk factors for colon cancer.  Your health care provider may also recommend using home test kits to check for hidden blood in the stool.  A small camera at the end of a tube can be used to examine your colon directly  (sigmoidoscopy or colonoscopy). This is done to check for the earliest forms of colorectal cancer.  Routine screening usually begins at age 56.  Direct examination of the colon should be repeated every 5-10 years through 56 years of age. However, you may need to be screened more often if early forms of precancerous polyps or small growths are found.  Skin Cancer  Check your skin from head to toe regularly.  Tell your health care provider about any new moles or changes in moles, especially if there is a change in a mole's shape or color.  Also tell your health care provider if you have a mole that is larger than the size of a pencil eraser.  Always use sunscreen. Apply sunscreen liberally and repeatedly throughout the day.  Protect yourself by wearing long sleeves, pants, a wide-brimmed hat, and sunglasses whenever you are outside.  Heart disease, diabetes, and high blood pressure  High blood pressure causes heart disease and increases the risk of  stroke. High blood pressure is more likely to develop in: ? People who have blood pressure in the high end of the normal range (130-139/85-89 mm Hg). ? People who are overweight or obese. ? People who are African American.  If you are 32-32 years of age, have your blood pressure checked every 3-5 years. If you are 79 years of age or older, have your blood pressure checked every year. You should have your blood pressure measured twice-once when you are at a hospital or clinic, and once when you are not at a hospital or clinic. Record the average of the two measurements. To check your blood pressure when you are not at a hospital or clinic, you can use: ? An automated blood pressure machine at a pharmacy. ? A home blood pressure monitor.  If you are between 33 years and 15 years old, ask your health care provider if you should take aspirin to prevent strokes.  Have regular diabetes screenings. This involves taking a blood sample to check your  fasting blood sugar level. ? If you are at a normal weight and have a low risk for diabetes, have this test once every three years after 56 years of age. ? If you are overweight and have a high risk for diabetes, consider being tested at a younger age or more often. Preventing infection Hepatitis B  If you have a higher risk for hepatitis B, you should be screened for this virus. You are considered at high risk for hepatitis B if: ? You were born in a country where hepatitis B is common. Ask your health care provider which countries are considered high risk. ? Your parents were born in a high-risk country, and you have not been immunized against hepatitis B (hepatitis B vaccine). ? You have HIV or AIDS. ? You use needles to inject street drugs. ? You live with someone who has hepatitis B. ? You have had sex with someone who has hepatitis B. ? You get hemodialysis treatment. ? You take certain medicines for conditions, including cancer, organ transplantation, and autoimmune conditions.  Hepatitis C  Blood testing is recommended for: ? Everyone born from [birth year] through 1964. ? Anyone with known risk factors for hepatitis C.  Sexually transmitted infections (STIs)  You should be screened for sexually transmitted infections (STIs) including gonorrhea and chlamydia if: ? You are sexually active and are younger than 56 years of age. ? You are older than 56 years of age and your health care provider tells you that you are at risk for this type of infection. ? Your sexual activity has changed since you were last screened and you are at an increased risk for chlamydia or gonorrhea. Ask your health care provider if you are at risk.  If you do not have HIV, but are at risk, it may be recommended that you take a prescription medicine daily to prevent HIV infection. This is called pre-exposure prophylaxis (PrEP). You are considered at risk if: ? You are sexually active and do not regularly use condoms  or know the HIV status of your partner(s). ? You take drugs by injection. ? You are sexually active with a partner who has HIV.  Talk with your health care provider about whether you are at high risk of being infected with HIV. If you choose to begin PrEP, you should first be tested for HIV. You should then be tested every 3 months for as long as you are taking PrEP. Pregnancy  If  you are premenopausal and you may become pregnant, ask your health care provider about preconception counseling.  If you may become pregnant, take 400 to 800 micrograms (mcg) of folic acid every day.  If you want to prevent pregnancy, talk to your health care provider about birth control (contraception). Osteoporosis and menopause  Osteoporosis is a disease in which the bones lose minerals and strength with aging. This can result in serious bone fractures. Your risk for osteoporosis can be identified using a bone density scan.  If you are 46 years of age or older, or if you are at risk for osteoporosis and fractures, ask your health care provider if you should be screened.  Ask your health care provider whether you should take a calcium or vitamin D supplement to lower your risk for osteoporosis.  Menopause may have certain physical symptoms and risks.  Hormone replacement therapy may reduce some of these symptoms and risks. Talk to your health care provider about whether hormone replacement therapy is right for you. Follow these instructions at home:  Schedule regular health, dental, and eye exams.  Stay current with your immunizations.  Do not use any tobacco products including cigarettes, chewing tobacco, or electronic cigarettes.  If you are pregnant, do not drink alcohol.  If you are breastfeeding, limit how much and how often you drink alcohol.  Limit alcohol intake to no more than 1 drink per day for nonpregnant women. One drink equals 12 ounces of beer, 5 ounces of wine, or 1 ounces of hard  liquor.  Do not use street drugs.  Do not share needles.  Ask your health care provider for help if you need support or information about quitting drugs.  Tell your health care provider if you often feel depressed.  Tell your health care provider if you have ever been abused or do not feel safe at home. This information is not intended to replace advice given to you by your health care provider. Make sure you discuss any questions you have with your health care provider. Document Released: 08/06/2010 Document Revised: 06/29/2015 Document Reviewed: 10/25/2014 Elsevier Interactive Patient Education  [employer].

## 2017-03-14 NOTE — Assessment & Plan Note (Signed)
Improved with some diet changes.

## 2017-03-14 NOTE — Assessment & Plan Note (Signed)
BP good today and working on diet and exercise.

## 2017-03-18 DIAGNOSIS — Z124 Encounter for screening for malignant neoplasm of cervix: Secondary | ICD-10-CM | POA: Diagnosis not present

## 2017-03-18 DIAGNOSIS — Z01419 Encounter for gynecological examination (general) (routine) without abnormal findings: Secondary | ICD-10-CM | POA: Diagnosis not present

## 2017-03-18 LAB — HM PAP SMEAR

## 2017-03-19 ENCOUNTER — Telehealth: Payer: Self-pay

## 2017-03-19 NOTE — Telephone Encounter (Signed)
PA started on CoverMyMeds [id number]

## 2017-03-26 NOTE — Telephone Encounter (Signed)
PA cannot be preformed because Belviq is a plan exclusion for patient. There is no coverage criteria to review and apply. Patient has to call insurance to see if anything is covered

## 2017-03-27 NOTE — Telephone Encounter (Signed)
LVM for patient informing them of PA response and MD response. Patient may call back and give another medication that is covered or they will try the savings card that is available online on [employer]

## 2017-03-27 NOTE — Telephone Encounter (Signed)
Informed Pt of response, she will hold off on any meds at this time, she is working on diet and exercise, she will discuss this with Dr. Sharlet Salina next time she comes in. She did not want to call the insurance company

## 2017-03-27 NOTE — Telephone Encounter (Signed)
Ok, she can try savings card from online.

## 2017-03-27 NOTE — Telephone Encounter (Signed)
fyi

## 2017-04-18 ENCOUNTER — Encounter: Payer: Self-pay | Admitting: Internal Medicine

## 2017-04-18 DIAGNOSIS — E119 Type 2 diabetes mellitus without complications: Secondary | ICD-10-CM | POA: Diagnosis not present

## 2017-04-18 LAB — HM DIABETES EYE EXAM

## 2017-04-23 ENCOUNTER — Encounter: Payer: Self-pay | Admitting: Internal Medicine

## 2017-04-23 NOTE — Progress Notes (Signed)
Abstracted and sent to scan  

## 2017-08-21 ENCOUNTER — Ambulatory Visit: Payer: 59 | Admitting: Internal Medicine

## 2017-08-21 ENCOUNTER — Encounter: Payer: Self-pay | Admitting: Internal Medicine

## 2017-08-21 DIAGNOSIS — M25562 Pain in left knee: Secondary | ICD-10-CM | POA: Insufficient documentation

## 2017-08-21 DIAGNOSIS — Z6839 Body mass index (BMI) 39.0-39.9, adult: Secondary | ICD-10-CM

## 2017-08-21 DIAGNOSIS — K219 Gastro-esophageal reflux disease without esophagitis: Secondary | ICD-10-CM

## 2017-08-21 DIAGNOSIS — E6609 Other obesity due to excess calories: Secondary | ICD-10-CM

## 2017-08-21 MED ORDER — RANITIDINE HCL 150 MG PO TABS: 150.0000 mg | ORAL_TABLET | Freq: Two times a day (BID) | ORAL | 6 refills | Status: DC

## 2017-08-21 MED ORDER — DICLOFENAC SODIUM 1 % TD GEL: 2.0000 g | Freq: Three times a day (TID) | TRANSDERMAL | 6 refills | Status: DC | PRN

## 2017-08-21 NOTE — Assessment & Plan Note (Signed)
Likely her weight is causing exacerbation of her health problems currently. She is currently taking belviq to work on weight loss and is down about 2 pounds since taking this about 1-2 months. She would like to continue.

## 2017-08-21 NOTE — Progress Notes (Signed)
   Subjective:    Patient ID: Isabel [name], female    DOB: Mar 05, 1960, 57 y.o.   MRN: [id number]  HPI The patient is a 57 YO female coming in for several concerns including GERD (some pain in the upper middle stomach, more belching, denies sour taste in mouth, denies blood in stool or dark stool, previously has had GERD, not taking anything for it, rare tums which helps, denies change in diet, no food triggers for symptoms) and left knee pain (popping some with walking, very stiff in the last several months, worse in the last 2-3 weeks, denies injury or overuse, is not exercising lately, denies rash, denies swelling in the calf, some swelling in the knee itself, has been taking ibuprofen and advil more lately, overall stable since onset, 3-4/10).   Review of Systems  Constitutional: Negative.   HENT: Negative.   Eyes: Negative.   Respiratory: Negative for cough, chest tightness and shortness of breath.   Cardiovascular: Negative for chest pain, palpitations and leg swelling.  Gastrointestinal: Positive for abdominal pain. Negative for abdominal distention, constipation, diarrhea, nausea and vomiting.  Musculoskeletal: Positive for arthralgias, joint swelling and myalgias. Negative for back pain and gait problem.  Skin: Negative.   Neurological: Negative.   Psychiatric/Behavioral: Negative.       Objective:   Physical Exam  Constitutional: She is oriented to person, place, and time. She appears well-developed and well-nourished.  HENT:  Head: Normocephalic and atraumatic.  Eyes: EOM are normal.  Neck: Normal range of motion.  Cardiovascular: Normal rate and regular rhythm.  Pulmonary/Chest: Effort normal and breath sounds normal. No respiratory distress. She has no wheezes. She has no rales.  Abdominal: Soft. Bowel sounds are normal. She exhibits no distension. There is no tenderness. There is no rebound.  Musculoskeletal: She exhibits no edema.  Some mild effusion in the left knee,  pain diffuse and along the superior kneecap. ACL and PCL intact.  Neurological: She is alert and oriented to person, place, and time. Coordination normal.  Skin: Skin is warm and dry.  Psychiatric: She has a normal mood and affect.   Vitals:   08/21/17 1022  BP: 126/68  Pulse: 97  Temp: 98.4 F (36.9 C)  TempSrc: Oral  SpO2: 95%  Weight: 242 lb (109.8 kg)  Height: 5\' 6"  (1.676 m)      Assessment & Plan:

## 2017-08-21 NOTE — Assessment & Plan Note (Signed)
Likely from arthritis. Rx for voltaren gel and advised to take turmeric over the counter. If no relief may need intra-articular injection.

## 2017-08-21 NOTE — Patient Instructions (Addendum)
We will have you try pepcid up to twice a day as needed.   Turmeric is a good vitamin that helps with arthritis pain.   We have sent in voltaren gel to use on the knees up to 3 times a day as needed for pain.

## 2017-08-21 NOTE — Assessment & Plan Note (Signed)
Zantac to use, likely trigger is the recent increase in NSAID usage. We have a new plan for her knee pain to help reduce NSAID usage. We talked about weight loss as well to help. Diet information given to prevent GERD.

## 2017-08-26 NOTE — Progress Notes (Signed)
Abstracted and sent to scan  

## 2017-09-05 ENCOUNTER — Encounter (HOSPITAL_COMMUNITY): Payer: Self-pay

## 2017-09-05 ENCOUNTER — Ambulatory Visit (HOSPITAL_COMMUNITY)
Admission: EM | Admit: 2017-09-05 | Discharge: 2017-09-05 | Disposition: A | Discharge status: Home / Self Care | Payer: 59 | Attending: Internal Medicine | Admitting: Internal Medicine

## 2017-09-05 DIAGNOSIS — Z7982 Long term (current) use of aspirin: Secondary | ICD-10-CM | POA: Diagnosis not present

## 2017-09-05 DIAGNOSIS — K219 Gastro-esophageal reflux disease without esophagitis: Secondary | ICD-10-CM | POA: Insufficient documentation

## 2017-09-05 DIAGNOSIS — Z9049 Acquired absence of other specified parts of digestive tract: Secondary | ICD-10-CM | POA: Diagnosis not present

## 2017-09-05 DIAGNOSIS — Z841 Family history of disorders of kidney and ureter: Secondary | ICD-10-CM | POA: Diagnosis not present

## 2017-09-05 DIAGNOSIS — J029 Acute pharyngitis, unspecified: Secondary | ICD-10-CM | POA: Insufficient documentation

## 2017-09-05 DIAGNOSIS — Z8249 Family history of ischemic heart disease and other diseases of the circulatory system: Secondary | ICD-10-CM | POA: Insufficient documentation

## 2017-09-05 DIAGNOSIS — Z79899 Other long term (current) drug therapy: Secondary | ICD-10-CM | POA: Diagnosis not present

## 2017-09-05 LAB — POCT RAPID STREP A: Streptococcus, Group A Screen (Direct): NEGATIVE

## 2017-09-05 MED ORDER — LIDOCAINE VISCOUS HCL 2 % MT SOLN: 15.0000 mL | OROMUCOSAL | 0 refills | Status: DC | PRN

## 2017-09-05 MED ORDER — FLUTICASONE PROPIONATE 50 MCG/ACT NA SUSP: 2.0000 | Freq: Every day | NASAL | 0 refills | Status: DC

## 2017-09-05 MED ORDER — IPRATROPIUM BROMIDE 0.06 % NA SOLN: 2.0000 | Freq: Four times a day (QID) | NASAL | 0 refills | Status: DC

## 2017-09-05 NOTE — ED Triage Notes (Signed)
Pt presents with sore throat. 

## 2017-09-05 NOTE — ED Provider Notes (Signed)
[facility] [facility]    CSN: [id number] Arrival date & time: 09/05/17  1809     History   Chief Complaint Chief Complaint  Patient presents with  . Sore Throat    HPI Isabel [name] is a 57 y.o. female.   57 year old female comes in with 1 week history of sore throat.  Has not had obvious rhinorrhea, nasal congestion.  mild cough starting this past few days.  Has had some postnasal drip.  Denies fever, chills, night sweats.  Painful swallowing without trouble breathing, swelling of the throat, tripoding, drooling.  States does have history of GERD, but symptoms resolved after Zantac.  Denies abdominal pain, nausea, vomiting.  Has been taking Mucinex, cough drops, honey tea with some relief.  Never smoker.     Past Medical History:  Diagnosis Date  . GERD (gastroesophageal reflux disease)   . HYPERGLYCEMIA   . OBESITY     Patient Active Problem List   Diagnosis Date Noted  . Left knee pain 08/21/2017  . Elevated blood pressure reading in office without diagnosis of hypertension 03/12/2016  . Routine general medical examination at a health care facility 09/29/2014  . GERD (gastroesophageal reflux disease)   . Obesity 04/18/2009    Past Surgical History:  Procedure Laterality Date  . CESAREAN SECTION  1992  . LAPAROSCOPIC CHOLECYSTECTOMY  2005    OB History   None      Home Medications    Prior to Admission medications   Medication Sig Start Date End Date Taking? Authorizing Provider  aspirin EC 81 MG tablet Take 81 mg by mouth daily.    [provider]  BIOTIN 5000 PO Take 5,000 mcg by mouth.    [provider]  Calcium Carb-Cholecalciferol (CALCIUM-VITAMIN D) 500-200 MG-UNIT tablet Take 1 tablet by mouth daily.    [provider]  diclofenac sodium (VOLTAREN) 1 % GEL Apply 2 g topically 3 (three) times daily as needed. 08/21/17   Hoyt Koch, MD  fluticasone Asencion Islam) 50 MCG/ACT nasal spray Place 2 sprays into both  nostrils daily. 09/05/17   Tasia Catchings, Takara Sermons V, PA-C  HYDROcodone-homatropine (HYCODAN) 5-1.5 MG/5ML syrup Take 5 mLs by mouth every 8 (eight) hours as needed for cough. 08/21/16   McVey, Gelene Mink, PA-C  ipratropium (ATROVENT) 0.06 % nasal spray Place 2 sprays into both nostrils 4 (four) times daily. 09/05/17   Tasia Catchings, Linton Stolp V, PA-C  lidocaine (XYLOCAINE) 2 % solution Use as directed 15 mLs in the mouth or throat as needed for mouth pain. 09/05/17   Ok Edwards, PA-C  Lorcaserin HCl ER (BELVIQ XR) 20 MG TB24 Take 20 mg by mouth daily. 03/14/17   Hoyt Koch, MD  Multiple Vitamins-Calcium (ONE-A-DAY WOMENS FORMULA) TABS Take by mouth daily. Reported on 02/02/2015    [provider]  Omega-3 Fatty Acids (FISH OIL) 1000 MG CAPS Take by mouth.    [provider]  ranitidine (ZANTAC) 150 MG tablet Take 1 tablet (150 mg total) by mouth 2 (two) times daily. 08/21/17   Hoyt Koch, MD  vitamin E 400 UNIT capsule Take 400 Units by mouth daily.    [provider]    Family History Family History  Problem Relation Age of Onset  . Prostate cancer Father   . Hypertension Brother   . Kidney disease Brother   . Stroke Maternal Grandmother   . Breast cancer Maternal Aunt   . Colon cancer Neg Hx   . Stomach cancer  Neg Hx     Social History Social History   Tobacco Use  . Smoking status: Never Smoker  . Smokeless tobacco: Never Used  . Tobacco comment: Married, 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 Alcohol use: Yes    Alcohol/week: 1.2 oz    Types: 2 Glasses of wine per week  . Drug use: No     Allergies   Patient has no known allergies.   Review of Systems Review of Systems  Reason unable to perform ROS: See HPI as above.     Physical Exam Triage Vital Signs ED Triage Vitals [09/05/17 1851]  Enc Vitals Group     BP 127/66     Pulse Rate 100     Resp 20     Temp 97.9 F (36.6 C)     Temp Source Temporal       SpO2 100 %     Weight      Height      Head Circumference      Peak Flow      Pain Score      Pain Loc      Pain Edu?      Excl. in [place]?    No data found.  Updated Vital Signs BP 127/66 (BP Location: Left Arm)   Pulse 100   Temp 97.9 F (36.6 C) (Temporal)   Resp 20   LMP 11/22/2011   SpO2 100%   Physical Exam  Constitutional: She is oriented to person, place, and time. She appears well-developed and well-nourished.  Non-toxic appearance. She does not appear ill. No distress.  HENT:  Head: Normocephalic and atraumatic.  Right Ear: Tympanic membrane, external ear and ear canal normal. Tympanic membrane is not erythematous and not bulging.  Left Ear: Tympanic membrane, external ear and ear canal normal. Tympanic membrane is not erythematous and not bulging.  Nose: Nose normal. Right sinus exhibits no maxillary sinus tenderness and no frontal sinus tenderness. Left sinus exhibits no maxillary sinus tenderness and no frontal sinus tenderness.  Mouth/Throat: Uvula is midline and mucous membranes are normal. Posterior oropharyngeal erythema present. No tonsillar exudate.  Eyes: Pupils are equal, round, and reactive to light. Conjunctivae are normal.  Neck: Normal range of motion. Neck supple.  Cardiovascular: Normal rate, regular rhythm and normal heart sounds. Exam reveals no gallop and no friction rub.  No murmur heard. Pulmonary/Chest: Effort normal and breath sounds normal. She has no decreased breath sounds. She has no wheezes. She has no rhonchi. She has no rales.  Lymphadenopathy:    She has no cervical adenopathy.  Neurological: She is alert and oriented to person, place, and time.  Skin: Skin is warm and dry.  Psychiatric: She has a normal mood and affect. Her behavior is normal. Judgment normal.     UC Treatments / Results  Labs (all labs ordered are listed, but only abnormal results are displayed) Labs Reviewed  CULTURE, GROUP A STREP [facility])  POCT RAPID STREP A     EKG None  Radiology No results found.  Procedures Procedures (including critical care time)  Medications Ordered in UC Medications - No data to display  Initial Impression / Assessment and Plan / UC Course  I have reviewed the triage vital signs and the nursing notes.  Pertinent labs & imaging results that were available during my care of the patient were reviewed by me and considered in my medical decision making (see chart  for details).    Rapid strep negative. Patient is nontoxic in appearance. Symptomatic treatment as needed. Return precautions given.   Final Clinical Impressions(s) / UC Diagnoses   Final diagnoses:  Pharyngitis, unspecified etiology    ED Prescriptions    Medication Sig Dispense Auth. Provider   lidocaine (XYLOCAINE) 2 % solution Use as directed 15 mLs in the mouth or throat as needed for mouth pain. 100 mL Mima Cranmore V, PA-C   fluticasone (FLONASE) 50 MCG/ACT nasal spray Place 2 sprays into both nostrils daily. 1 g Donalee Gaumond V, PA-C   ipratropium (ATROVENT) 0.06 % nasal spray Place 2 sprays into both nostrils 4 (four) times daily. 15 mL Tobin Chad, [place] 09/05/17 1940

## 2017-09-05 NOTE — Discharge Instructions (Addendum)
Rapid strep negative. Symptoms are most likely due to viral illness/ drainage down your throat. Start lidocaine for sore throat, do not eat or drink for the next 40 mins after use as it can stunt your gag reflex. Flonase, atroven for nasal congestion/drainage. You can use over the counter nasal saline rinse such as neti pot for nasal congestion. Monitor for any worsening of symptoms, swelling of the throat, trouble breathing, trouble swallowing, leaning forward to breath, drooling, go to the emergency department for further evaluation needed.  As discussed, can restart zantac to make sure acid reflux is not causing the symptoms.   For sore throat/cough try using a honey-based tea. Use 3 teaspoons of honey with juice squeezed from half lemon. Place shaved pieces of ginger into 1/2-1 cup of water and warm over stove top. Then mix the ingredients and repeat every 4 hours as needed.

## 2017-09-08 LAB — CULTURE, GROUP A STREP (THRC)

## 2017-12-01 ENCOUNTER — Encounter: Payer: Self-pay | Admitting: Emergency Medicine

## 2017-12-01 ENCOUNTER — Ambulatory Visit (INDEPENDENT_AMBULATORY_CARE_PROVIDER_SITE_OTHER): Payer: 59

## 2017-12-01 DIAGNOSIS — Z23 Encounter for immunization: Secondary | ICD-10-CM

## 2018-02-02 ENCOUNTER — Other Ambulatory Visit: Payer: Self-pay | Admitting: Obstetrics and Gynecology

## 2018-02-02 DIAGNOSIS — Z1231 Encounter for screening mammogram for malignant neoplasm of breast: Secondary | ICD-10-CM

## 2018-02-13 ENCOUNTER — Encounter: Payer: 59 | Admitting: Internal Medicine

## 2018-03-06 ENCOUNTER — Ambulatory Visit
Admission: RE | Admit: 2018-03-06 | Discharge: 2018-03-06 | Disposition: A | Discharge status: Home / Self Care | Payer: 59 | Source: Ambulatory Visit | Attending: Obstetrics and Gynecology | Admitting: Obstetrics and Gynecology

## 2018-03-06 DIAGNOSIS — Z1231 Encounter for screening mammogram for malignant neoplasm of breast: Secondary | ICD-10-CM

## 2018-03-17 ENCOUNTER — Other Ambulatory Visit (INDEPENDENT_AMBULATORY_CARE_PROVIDER_SITE_OTHER): Payer: 59

## 2018-03-17 ENCOUNTER — Ambulatory Visit (INDEPENDENT_AMBULATORY_CARE_PROVIDER_SITE_OTHER): Payer: 59 | Admitting: Internal Medicine

## 2018-03-17 ENCOUNTER — Encounter: Payer: Self-pay | Admitting: Internal Medicine

## 2018-03-17 VITALS — BP 120/78 | HR 91 | Temp 97.9°F | Ht 66.0 in | Wt 242.0 lb

## 2018-03-17 DIAGNOSIS — K219 Gastro-esophageal reflux disease without esophagitis: Secondary | ICD-10-CM

## 2018-03-17 DIAGNOSIS — Z0000 Encounter for general adult medical examination without abnormal findings: Secondary | ICD-10-CM

## 2018-03-17 DIAGNOSIS — Z6839 Body mass index (BMI) 39.0-39.9, adult: Secondary | ICD-10-CM | POA: Diagnosis not present

## 2018-03-17 DIAGNOSIS — E6609 Other obesity due to excess calories: Secondary | ICD-10-CM

## 2018-03-17 LAB — LIPID PANEL
CHOL/HDL RATIO: 3
Cholesterol: 147 mg/dL (ref 0–200)
HDL: 42.6 mg/dL (ref >39.00)
LDL CALC: 86 mg/dL (ref 0–99)
NONHDL: 104.54
Triglycerides: 92 mg/dL (ref 0.0–149.0)
VLDL: 18.4 mg/dL (ref 0.0–40.0)

## 2018-03-17 LAB — COMPREHENSIVE METABOLIC PANEL
ALT: 33 U/L (ref 0–35)
AST: 33 U/L (ref 0–37)
Albumin: 4.5 g/dL (ref 3.5–5.2)
Alkaline Phosphatase: 52 U/L (ref 39–117)
BUN: 14 mg/dL (ref 6–23)
CHLORIDE: 104 meq/L (ref 96–112)
CO2: 28 meq/L (ref 19–32)
CREATININE: 0.79 mg/dL (ref 0.40–1.20)
Calcium: 9.7 mg/dL (ref 8.4–10.5)
GFR: 90.99 mL/min (ref >60.00)
GLUCOSE: 105 mg/dL — AB (ref 70–99)
Potassium: 4.4 mEq/L (ref 3.5–5.1)
SODIUM: 139 meq/L (ref 135–145)
Total Bilirubin: 0.5 mg/dL (ref 0.2–1.2)
Total Protein: 7.7 g/dL (ref 6.0–8.3)

## 2018-03-17 LAB — CBC
HEMATOCRIT: 41.5 % (ref 36.0–46.0)
HEMOGLOBIN: 13.9 g/dL (ref 12.0–15.0)
MCHC: 33.4 g/dL (ref 30.0–36.0)
MCV: 83.9 fl (ref 78.0–100.0)
Platelets: 281 10*3/uL (ref 150.0–400.0)
RBC: 4.95 Mil/uL (ref 3.87–5.11)
RDW: 14.4 % (ref 11.5–15.5)
WBC: 6.4 10*3/uL (ref 4.0–10.5)

## 2018-03-17 LAB — HEMOGLOBIN A1C: HEMOGLOBIN A1C: 5.8 % (ref 4.6–6.5)

## 2018-03-17 NOTE — Addendum Note (Signed)
Addended by: Pricilla Holm A on: 03/17/2018 09:57 AM   Modules accepted: Orders

## 2018-03-17 NOTE — Patient Instructions (Addendum)
Think about getting the shingles vaccine.  Health Maintenance, Female Adopting a healthy lifestyle and getting preventive care can go a long way to promote health and wellness. Talk with your health care provider about what schedule of regular examinations is right for you. This is a good chance for you to check in with your provider about disease prevention and staying healthy. In between checkups, there are plenty of things you can do on your own. Experts have done a lot of research about which lifestyle changes and preventive measures are most likely to keep you healthy. Ask your health care provider for more information. Weight and diet Eat a healthy diet  Be sure to include plenty of vegetables, fruits, low-fat dairy products, and lean protein.  Do not eat a lot of foods high in solid fats, added sugars, or salt.  Get regular exercise. This is one of the most important things you can do for your health. ? Most adults should exercise for at least 150 minutes each week. The exercise should increase your heart rate and make you sweat (moderate-intensity exercise). ? Most adults should also do strengthening exercises at least twice a week. This is in addition to the moderate-intensity exercise. Maintain a healthy weight  Body mass index (BMI) is a measurement that can be used to identify possible weight problems. It estimates body fat based on height and weight. Your health care provider can help determine your BMI and help you achieve or maintain a healthy weight.  For females 62 years of age and older: ? A BMI below 18.5 is considered underweight. ? A BMI of 18.5 to 24.9 is normal. ? A BMI of 25 to 29.9 is considered overweight. ? A BMI of 30 and above is considered obese. Watch levels of cholesterol and blood lipids  You should start having your blood tested for lipids and cholesterol at 57 years of age, then have this test every 5 years.  You may need to have your cholesterol levels  checked more often if: ? Your lipid or cholesterol levels are high. ? You are older than 57 years of age. ? You are at high risk for heart disease. Cancer screening Lung Cancer  Lung cancer screening is recommended for adults 66-32 years old who are at high risk for lung cancer because of a history of smoking.  A yearly low-dose CT scan of the lungs is recommended for people who: ? Currently smoke. ? Have quit within the past 15 years. ? Have at least a 30-pack-year history of smoking. A pack year is smoking an average of one pack of cigarettes a day for 1 year.  Yearly screening should continue until it has been 15 years since you quit.  Yearly screening should stop if you develop a health problem that would prevent you from having lung cancer treatment. Breast Cancer  Practice breast self-awareness. This means understanding how your breasts normally appear and feel.  It also means doing regular breast self-exams. Let your health care provider know about any changes, no matter how small.  If you are in your 20s or 30s, you should have a clinical breast exam (CBE) by a health care provider every 1-3 years as part of a regular health exam.  If you are 41 or older, have a CBE every year. Also consider having a breast X-ray (mammogram) every year.  If you have a family history of breast cancer, talk to your health care provider about genetic screening.  If you are  at high risk for breast cancer, talk to your health care provider about having an MRI and a mammogram every year.  Breast cancer gene (BRCA) assessment is recommended for women who have family members with BRCA-related cancers. BRCA-related cancers include: ? Breast. ? Ovarian. ? Tubal. ? Peritoneal cancers.  Results of the assessment will determine the need for genetic counseling and BRCA1 and BRCA2 testing. Cervical Cancer Your health care provider may recommend that you be screened regularly for cancer of the pelvic  organs (ovaries, uterus, and vagina). This screening involves a pelvic examination, including checking for microscopic changes to the surface of your cervix (Pap test). You may be encouraged to have this screening done every 3 years, beginning at age 26.  For women ages 20-65, health care providers may recommend pelvic exams and Pap testing every 3 years, or they may recommend the Pap and pelvic exam, combined with testing for human papilloma virus (HPV), every 5 years. Some types of HPV increase your risk of cervical cancer. Testing for HPV may also be done on women of any age with unclear Pap test results.  Other health care providers may not recommend any screening for nonpregnant women who are considered low risk for pelvic cancer and who do not have symptoms. Ask your health care provider if a screening pelvic exam is right for you.  If you have had past treatment for cervical cancer or a condition that could lead to cancer, you need Pap tests and screening for cancer for at least 20 years after your treatment. If Pap tests have been discontinued, your risk factors (such as having a new sexual partner) need to be reassessed to determine if screening should resume. Some women have medical problems that increase the chance of getting cervical cancer. In these cases, your health care provider may recommend more frequent screening and Pap tests. Colorectal Cancer  This type of cancer can be detected and often prevented.  Routine colorectal cancer screening usually begins at 57 years of age and continues through 57 years of age.  Your health care provider may recommend screening at an earlier age if you have risk factors for colon cancer.  Your health care provider may also recommend using home test kits to check for hidden blood in the stool.  A small camera at the end of a tube can be used to examine your colon directly (sigmoidoscopy or colonoscopy). This is done to check for the earliest forms  of colorectal cancer.  Routine screening usually begins at age 56.  Direct examination of the colon should be repeated every 5-10 years through 57 years of age. However, you may need to be screened more often if early forms of precancerous polyps or small growths are found. Skin Cancer  Check your skin from head to toe regularly.  Tell your health care provider about any new moles or changes in moles, especially if there is a change in a mole's shape or color.  Also tell your health care provider if you have a mole that is larger than the size of a pencil eraser.  Always use sunscreen. Apply sunscreen liberally and repeatedly throughout the day.  Protect yourself by wearing long sleeves, pants, a wide-brimmed hat, and sunglasses whenever you are outside. Heart disease, diabetes, and high blood pressure  High blood pressure causes heart disease and increases the risk of stroke. High blood pressure is more likely to develop in: ? People who have blood pressure in the high end of the  normal range (130-139/85-89 mm Hg). ? People who are overweight or obese. ? People who are African American.  If you are 86-44 years of age, have your blood pressure checked every 3-5 years. If you are 64 years of age or older, have your blood pressure checked every year. You should have your blood pressure measured twice-once when you are at a hospital or clinic, and once when you are not at a hospital or clinic. Record the average of the two measurements. To check your blood pressure when you are not at a hospital or clinic, you can use: ? An automated blood pressure machine at a pharmacy. ? A home blood pressure monitor.  If you are between 25 years and 33 years old, ask your health care provider if you should take aspirin to prevent strokes.  Have regular diabetes screenings. This involves taking a blood sample to check your fasting blood sugar level. ? If you are at a normal weight and have a low risk for  diabetes, have this test once every three years after 57 years of age. ? If you are overweight and have a high risk for diabetes, consider being tested at a younger age or more often. Preventing infection Hepatitis B  If you have a higher risk for hepatitis B, you should be screened for this virus. You are considered at high risk for hepatitis B if: ? You were born in a country where hepatitis B is common. Ask your health care provider which countries are considered high risk. ? Your parents were born in a high-risk country, and you have not been immunized against hepatitis B (hepatitis B vaccine). ? You have HIV or AIDS. ? You use needles to inject street drugs. ? You live with someone who has hepatitis B. ? You have had sex with someone who has hepatitis B. ? You get hemodialysis treatment. ? You take certain medicines for conditions, including cancer, organ transplantation, and autoimmune conditions. Hepatitis C  Blood testing is recommended for: ? Everyone born from [birth year] through 1965. ? Anyone with known risk factors for hepatitis C. Sexually transmitted infections (STIs)  You should be screened for sexually transmitted infections (STIs) including gonorrhea and chlamydia if: ? You are sexually active and are younger than 57 years of age. ? You are older than 56 years of age and your health care provider tells you that you are at risk for this type of infection. ? Your sexual activity has changed since you were last screened and you are at an increased risk for chlamydia or gonorrhea. Ask your health care provider if you are at risk.  If you do not have HIV, but are at risk, it may be recommended that you take a prescription medicine daily to prevent HIV infection. This is called pre-exposure prophylaxis (PrEP). You are considered at risk if: ? You are sexually active and do not regularly use condoms or know the HIV status of your partner(s). ? You take drugs by injection. ? You are  sexually active with a partner who has HIV. Talk with your health care provider about whether you are at high risk of being infected with HIV. If you choose to begin PrEP, you should first be tested for HIV. You should then be tested every 3 months for as long as you are taking PrEP. Pregnancy  If you are premenopausal and you may become pregnant, ask your health care provider about preconception counseling.  If you may become pregnant, take 400 to  800 micrograms (mcg) of folic acid every day.  If you want to prevent pregnancy, talk to your health care provider about birth control (contraception). Osteoporosis and menopause  Osteoporosis is a disease in which the bones lose minerals and strength with aging. This can result in serious bone fractures. Your risk for osteoporosis can be identified using a bone density scan.  If you are 51 years of age or older, or if you are at risk for osteoporosis and fractures, ask your health care provider if you should be screened.  Ask your health care provider whether you should take a calcium or vitamin D supplement to lower your risk for osteoporosis.  Menopause may have certain physical symptoms and risks.  Hormone replacement therapy may reduce some of these symptoms and risks. Talk to your health care provider about whether hormone replacement therapy is right for you. Follow these instructions at home:  Schedule regular health, dental, and eye exams.  Stay current with your immunizations.  Do not use any tobacco products including cigarettes, chewing tobacco, or electronic cigarettes.  If you are pregnant, do not drink alcohol.  If you are breastfeeding, limit how much and how often you drink alcohol.  Limit alcohol intake to no more than 1 drink per day for nonpregnant women. One drink equals 12 ounces of beer, 5 ounces of wine, or 1 ounces of hard liquor.  Do not use street drugs.  Do not share needles.  Ask your health care  provider for help if you need support or information about quitting drugs.  Tell your health care provider if you often feel depressed.  Tell your health care provider if you have ever been abused or do not feel safe at home. This information is not intended to replace advice given to you by your health care provider. Make sure you discuss any questions you have with your health care provider. Document Released: 08/06/2010 Document Revised: 06/29/2015 Document Reviewed: 10/25/2014 Elsevier Interactive Patient Education  2019 [employer].

## 2018-03-17 NOTE — Assessment & Plan Note (Signed)
Checking HgA1c and lipid panel for complications. BP at goal.

## 2018-03-17 NOTE — Assessment & Plan Note (Signed)
Doing better with diet control. Not on meds currently.

## 2018-03-17 NOTE — Assessment & Plan Note (Signed)
Flu shot up to date. Shingrix counseled. Tetanus up to date. Colonoscopy up to date. Mammogram up to date, pap smear up to date. Counseled about sun safety and mole surveillance. Counseled about the dangers of distracted driving. Given 10 year screening recommendations.

## 2018-03-17 NOTE — Progress Notes (Signed)
   Subjective:   Patient ID: Isabel [name], female    DOB: 17-Jan-1961, 58 y.o.   MRN: [id number]  HPI The patient is a 58 YO female coming in for physical.   PMH, [place], social history reviewed and updated  Review of Systems  Constitutional: Negative.   HENT: Negative.   Eyes: Negative.   Respiratory: Negative for cough, chest tightness and shortness of breath.   Cardiovascular: Negative for chest pain, palpitations and leg swelling.  Gastrointestinal: Negative for abdominal distention, abdominal pain, constipation, diarrhea, nausea and vomiting.  Musculoskeletal: Negative.   Skin: Negative.   Neurological: Negative.   Psychiatric/Behavioral: Negative.     Objective:  Physical Exam Constitutional:      Appearance: She is well-developed. She is obese.  HENT:     Head: Normocephalic and atraumatic.  Neck:     Musculoskeletal: Normal range of motion.  Cardiovascular:     Rate and Rhythm: Normal rate and regular rhythm.  Pulmonary:     Effort: Pulmonary effort is normal. No respiratory distress.     Breath sounds: Normal breath sounds. No wheezing or rales.  Abdominal:     General: Bowel sounds are normal. There is no distension.     Palpations: Abdomen is soft.     Tenderness: There is no abdominal tenderness. There is no rebound.  Skin:    General: Skin is warm and dry.  Neurological:     Mental Status: She is alert and oriented to person, place, and time.     Coordination: Coordination normal.     Vitals:   03/17/18 0811  BP: 120/78  Pulse: 91  Temp: 97.9 F (36.6 C)  TempSrc: Oral  SpO2: 98%  Weight: 242 lb (109.8 kg)  Height: 5\' 6"  (1.676 m)    Assessment & Plan:

## 2018-09-28 LAB — HM DIABETES EYE EXAM

## 2018-10-24 ENCOUNTER — Ambulatory Visit (INDEPENDENT_AMBULATORY_CARE_PROVIDER_SITE_OTHER): Payer: 59

## 2018-10-24 DIAGNOSIS — Z23 Encounter for immunization: Secondary | ICD-10-CM | POA: Diagnosis not present

## 2019-02-08 ENCOUNTER — Other Ambulatory Visit: Payer: Self-pay | Admitting: Obstetrics and Gynecology

## 2019-02-08 DIAGNOSIS — Z1231 Encounter for screening mammogram for malignant neoplasm of breast: Secondary | ICD-10-CM

## 2019-03-16 ENCOUNTER — Ambulatory Visit
Admission: RE | Admit: 2019-03-16 | Discharge: 2019-03-16 | Disposition: A | Discharge status: Home / Self Care | Payer: 59 | Source: Ambulatory Visit | Attending: Obstetrics and Gynecology | Admitting: Obstetrics and Gynecology

## 2019-03-16 ENCOUNTER — Other Ambulatory Visit: Payer: Self-pay

## 2019-03-16 DIAGNOSIS — Z1231 Encounter for screening mammogram for malignant neoplasm of breast: Secondary | ICD-10-CM

## 2019-03-19 ENCOUNTER — Encounter: Payer: 59 | Admitting: Internal Medicine

## 2019-03-24 ENCOUNTER — Ambulatory Visit (INDEPENDENT_AMBULATORY_CARE_PROVIDER_SITE_OTHER): Payer: 59 | Admitting: Internal Medicine

## 2019-03-24 ENCOUNTER — Encounter: Payer: Self-pay | Admitting: Internal Medicine

## 2019-03-24 ENCOUNTER — Other Ambulatory Visit: Payer: Self-pay

## 2019-03-24 VITALS — BP 134/86 | HR 99 | Temp 98.8°F | Ht 66.0 in | Wt 248.0 lb

## 2019-03-24 DIAGNOSIS — Z0000 Encounter for general adult medical examination without abnormal findings: Secondary | ICD-10-CM

## 2019-03-24 LAB — LIPID PANEL
Cholesterol: 138 mg/dL (ref 0–200)
HDL: 44.4 mg/dL (ref >39.00)
LDL Cholesterol: 76 mg/dL (ref 0–99)
NonHDL: 93.43
Total CHOL/HDL Ratio: 3
Triglycerides: 87 mg/dL (ref 0.0–149.0)
VLDL: 17.4 mg/dL (ref 0.0–40.0)

## 2019-03-24 LAB — COMPREHENSIVE METABOLIC PANEL
ALT: 39 U/L — ABNORMAL HIGH (ref 0–35)
AST: 33 U/L (ref 0–37)
Albumin: 4.4 g/dL (ref 3.5–5.2)
Alkaline Phosphatase: 50 U/L (ref 39–117)
BUN: 11 mg/dL (ref 6–23)
CO2: 29 mEq/L (ref 19–32)
Calcium: 9.7 mg/dL (ref 8.4–10.5)
Chloride: 103 mEq/L (ref 96–112)
Creatinine, Ser: 0.81 mg/dL (ref 0.40–1.20)
GFR: 88.08 mL/min (ref >60.00)
Glucose, Bld: 117 mg/dL — ABNORMAL HIGH (ref 70–99)
Potassium: 4.3 mEq/L (ref 3.5–5.1)
Sodium: 139 mEq/L (ref 135–145)
Total Bilirubin: 0.4 mg/dL (ref 0.2–1.2)
Total Protein: 7.9 g/dL (ref 6.0–8.3)

## 2019-03-24 LAB — CBC
HCT: 43.9 % (ref 36.0–46.0)
Hemoglobin: 14.6 g/dL (ref 12.0–15.0)
MCHC: 33.1 g/dL (ref 30.0–36.0)
MCV: 84.7 fl (ref 78.0–100.0)
Platelets: 285 10*3/uL (ref 150.0–400.0)
RBC: 5.19 Mil/uL — ABNORMAL HIGH (ref 3.87–5.11)
RDW: 14.5 % (ref 11.5–15.5)
WBC: 5.3 10*3/uL (ref 4.0–10.5)

## 2019-03-24 NOTE — Assessment & Plan Note (Signed)
Weight up due to covid-19. Counseled about diet and exercise.

## 2019-03-24 NOTE — Progress Notes (Signed)
   Subjective:   Patient ID: Isabel [name], female    DOB: [birth year], 59 y.o.   MRN: [id number]  HPI The patient is a 59 YO female coming in for physical. Weight up due to covid-19 pandemic.   PMH, [facility], social history reviewed and updated.   Review of Systems  Constitutional: Negative.   HENT: Negative.   Eyes: Negative.   Respiratory: Negative for cough, chest tightness and shortness of breath.   Cardiovascular: Negative for chest pain, palpitations and leg swelling.  Gastrointestinal: Negative for abdominal distention, abdominal pain, constipation, diarrhea, nausea and vomiting.  Musculoskeletal: Negative.   Skin: Negative.   Neurological: Negative.   Psychiatric/Behavioral: Negative.     Objective:  Physical Exam Constitutional:      Appearance: She is well-developed. She is obese.  HENT:     Head: Normocephalic and atraumatic.  Cardiovascular:     Rate and Rhythm: Normal rate and regular rhythm.  Pulmonary:     Effort: Pulmonary effort is normal. No respiratory distress.     Breath sounds: Normal breath sounds. No wheezing or rales.  Abdominal:     General: Bowel sounds are normal. There is no distension.     Palpations: Abdomen is soft.     Tenderness: There is no abdominal tenderness. There is no rebound.  Musculoskeletal:     Cervical back: Normal range of motion.  Skin:    General: Skin is warm and dry.  Neurological:     Mental Status: She is alert and oriented to person, place, and time.     Coordination: Coordination normal.     Vitals:   03/24/19 0807  BP: 134/86  Pulse: 99  Temp: 98.8 F (37.1 C)  TempSrc: Oral  SpO2: 98%  Weight: 248 lb (112.5 kg)  Height: 5\' 6"  (1.676 m)    This visit occurred during the SARS-CoV-2 public health emergency.  Safety protocols were in place, including screening questions prior to the visit, additional usage of staff PPE, and extensive cleaning of exam room while observing appropriate contact time as indicated  for disinfecting solutions.   Assessment & Plan:

## 2019-03-24 NOTE — Assessment & Plan Note (Signed)
Flu shot up to date. Shingrix counseled. Tetanus up to date. Colonoscopy up to date. Mammogram up to date, pap smear up to date. Counseled about sun safety and mole surveillance. Counseled about the dangers of distracted driving. Given 10 year screening recommendations.

## 2019-03-24 NOTE — Patient Instructions (Signed)
Health Maintenance, Female Adopting a healthy lifestyle and getting preventive care are important in promoting health and wellness. Ask your health care provider about:  The right schedule for you to have regular tests and exams.  Things you can do on your own to prevent diseases and keep yourself healthy. What should I know about diet, weight, and exercise? Eat a healthy diet   Eat a diet that includes plenty of vegetables, fruits, low-fat dairy products, and lean protein.  Do not eat a lot of foods that are high in solid fats, added sugars, or sodium. Maintain a healthy weight Body mass index (BMI) is used to identify weight problems. It estimates body fat based on height and weight. Your health care provider can help determine your BMI and help you achieve or maintain a healthy weight. Get regular exercise Get regular exercise. This is one of the most important things you can do for your health. Most adults should:  Exercise for at least 150 minutes each week. The exercise should increase your heart rate and make you sweat (moderate-intensity exercise).  Do strengthening exercises at least twice a week. This is in addition to the moderate-intensity exercise.  Spend less time sitting. Even light physical activity can be beneficial. Watch cholesterol and blood lipids Have your blood tested for lipids and cholesterol at 58 years of age, then have this test every 5 years. Have your cholesterol levels checked more often if:  Your lipid or cholesterol levels are high.  You are older than 58 years of age.  You are at high risk for heart disease. What should I know about cancer screening? Depending on your health history and family history, you may need to have cancer screening at various ages. This may include screening for:  Breast cancer.  Cervical cancer.  Colorectal cancer.  Skin cancer.  Lung cancer. What should I know about heart disease, diabetes, and high blood  pressure? Blood pressure and heart disease  High blood pressure causes heart disease and increases the risk of stroke. This is more likely to develop in people who have high blood pressure readings, are of African descent, or are overweight.  Have your blood pressure checked: ? Every 3-5 years if you are 18-39 years of age. ? Every year if you are 40 years old or older. Diabetes Have regular diabetes screenings. This checks your fasting blood sugar level. Have the screening done:  Once every three years after age 40 if you are at a normal weight and have a low risk for diabetes.  More often and at a younger age if you are overweight or have a high risk for diabetes. What should I know about preventing infection? Hepatitis B If you have a higher risk for hepatitis B, you should be screened for this virus. Talk with your health care provider to find out if you are at risk for hepatitis B infection. Hepatitis C Testing is recommended for:  Everyone born from 1945 through 1965.  Anyone with known risk factors for hepatitis C. Sexually transmitted infections (STIs)  Get screened for STIs, including gonorrhea and chlamydia, if: ? You are sexually active and are younger than 58 years of age. ? You are older than 58 years of age and your health care provider tells you that you are at risk for this type of infection. ? Your sexual activity has changed since you were last screened, and you are at increased risk for chlamydia or gonorrhea. Ask your health care provider if   you are at risk.  Ask your health care provider about whether you are at high risk for HIV. Your health care provider may recommend a prescription medicine to help prevent HIV infection. If you choose to take medicine to prevent HIV, you should first get tested for HIV. You should then be tested every 3 months for as long as you are taking the medicine. Pregnancy  If you are about to stop having your period (premenopausal) and  you may become pregnant, seek counseling before you get pregnant.  Take 400 to 800 micrograms (mcg) of folic acid every day if you become pregnant.  Ask for birth control (contraception) if you want to prevent pregnancy. Osteoporosis and menopause Osteoporosis is a disease in which the bones lose minerals and strength with aging. This can result in bone fractures. If you are 65 years old or older, or if you are at risk for osteoporosis and fractures, ask your health care provider if you should:  Be screened for bone loss.  Take a calcium or vitamin D supplement to lower your risk of fractures.  Be given hormone replacement therapy (HRT) to treat symptoms of menopause. Follow these instructions at home: Lifestyle  Do not use any products that contain nicotine or tobacco, such as cigarettes, e-cigarettes, and chewing tobacco. If you need help quitting, ask your health care provider.  Do not use street drugs.  Do not share needles.  Ask your health care provider for help if you need support or information about quitting drugs. Alcohol use  Do not drink alcohol if: ? Your health care provider tells you not to drink. ? You are pregnant, may be pregnant, or are planning to become pregnant.  If you drink alcohol: ? Limit how much you use to 0-1 drink a day. ? Limit intake if you are breastfeeding.  Be aware of how much alcohol is in your drink. In the [place] one drink equals one 12 oz bottle of beer (355 mL), one 5 oz glass of wine (148 mL), or one 1 oz glass of hard liquor (44 mL). General instructions  Schedule regular health, dental, and eye exams.  Stay current with your vaccines.  Tell your health care provider if: ? You often feel depressed. ? You have ever been abused or do not feel safe at home. Summary  Adopting a healthy lifestyle and getting preventive care are important in promoting health and wellness.  Follow your health care provider's instructions about healthy  diet, exercising, and getting tested or screened for diseases.  Follow your health care provider's instructions on monitoring your cholesterol and blood pressure. This information is not intended to replace advice given to you by your health care provider. Make sure you discuss any questions you have with your health care provider. Document Revised: 01/14/2018 Document Reviewed: 01/14/2018 Elsevier Patient Education  2020 [employer]  

## 2019-04-21 ENCOUNTER — Ambulatory Visit (HOSPITAL_COMMUNITY)
Admission: EM | Admit: 2019-04-21 | Discharge: 2019-04-21 | Disposition: A | Discharge status: Home / Self Care | Payer: 59 | Attending: Family Medicine | Admitting: Family Medicine

## 2019-04-21 ENCOUNTER — Encounter (HOSPITAL_COMMUNITY): Payer: Self-pay | Admitting: Emergency Medicine

## 2019-04-21 ENCOUNTER — Other Ambulatory Visit: Payer: Self-pay

## 2019-04-21 DIAGNOSIS — K149 Disease of tongue, unspecified: Secondary | ICD-10-CM

## 2019-04-21 DIAGNOSIS — R030 Elevated blood-pressure reading, without diagnosis of hypertension: Secondary | ICD-10-CM

## 2019-04-21 MED ORDER — PREDNISONE 10 MG (21) PO TBPK: ORAL_TABLET | Freq: Every day | ORAL | 0 refills | Status: DC

## 2019-04-21 NOTE — Discharge Instructions (Signed)
Your blood pressure was noted to be elevated during your visit today. You may return here within the next few days to recheck if unable to see your primary care doctor. If your blood pressure remains persistently elevated, you may need to begin taking a medication.  BP (!) 156/74    Pulse 88    Temp 98.3 F (36.8 C) (Oral)    Resp 16    LMP 11/22/2011    SpO2 100%

## 2019-04-21 NOTE — ED Triage Notes (Signed)
PT reports she noticed her tongue felt odd at work yesterday. When she looked, her tongue and lips were very red. No swelling, no pain, no itching. Benadryl helped yesterday and last night. She is unsure if she has encountered something she is allergic to. Second COVID vaccine was Saturday. Tongue is tender / feels odd when she eats anything salty, pineapple, and uses mouthwash.

## 2019-04-22 ENCOUNTER — Encounter (HOSPITAL_COMMUNITY): Payer: Self-pay | Admitting: Family Medicine

## 2019-04-22 NOTE — ED Provider Notes (Signed)
[place]   [id number] 04/21/19 Arrival Time: [id number]  ASSESSMENT & PLAN:  1. Tongue irritation   2. Elevated blood pressure reading without diagnosis of hypertension     Discussed possibility of allergic reaction/glossitis.  Begin trial of: Meds ordered this encounter  Medications  . predniSONE (STERAPRED UNI-PAK 21 TAB) 10 MG (21) TBPK tablet    Sig: Take by mouth daily. Take as directed.    Dispense:  21 tablet    Refill:  0   Continue Benadryl as needed.    Discharge Instructions     Your blood pressure was noted to be elevated during your visit today. You may return here within the next few days to recheck if unable to see your primary care doctor. If your blood pressure remains persistently elevated, you may need to begin taking a medication.  BP (!) 156/74   Pulse 88   Temp 98.3 F (36.8 C) (Oral)   Resp 16   LMP 11/22/2011   SpO2 100%       Reviewed expectations re: course of current medical issues. Questions answered. Outlined signs and symptoms indicating need for more acute intervention. Patient verbalized understanding. After Visit Summary given.   SUBJECTIVE: History from: patient. Isabel [name] is a 59 y.o. female who presents for evaluation of a possible allergic reaction. Reports tongue irritation ("felt odd") after eating yesterday. Inner lips slightly red. No swallowing or resp difficulties. Benadry with some relief. No new medications.  Increased blood pressure noted today. Reports that she has not been treated for hypertension in the past.  She reports no chest pain on exertion, no dyspnea on exertion, no swelling of ankles, no orthostatic dizziness or lightheadedness, no orthopnea or paroxysmal nocturnal dyspnea and no palpitations.    OBJECTIVE:  Vitals:   04/21/19 1800  BP: (!) 156/74  Pulse: 88  Resp: 16  Temp: 98.3 F (36.8 C)  TempSrc: Oral  SpO2: 100%    General appearance: alert; no distress Eyes: PERRLA;  EOMI; conjunctiva normal HENT: normocephalic; atraumatic; beefy red tongue; inner lips with slight erythema Neck: supple  Lungs: unlabored; speaks full sentences without difficulty Extremities: no edema; symmetrical with no gross deformities Skin: warm and dry; no rashes Neurologic: normal gait Psychological: alert and cooperative; normal mood and affect   No Known Allergies  Past Medical History:  Diagnosis Date  . GERD (gastroesophageal reflux disease)   . HYPERGLYCEMIA   . OBESITY    Social History   Socioeconomic History  . Marital status: Married    Spouse name: Not on file  . Number of children: Not on file  . Years of education: Not on file  . Highest education level: Not on file  Occupational History  . Not on file  Tobacco Use  . Smoking status: Never Smoker  . Smokeless tobacco: Never Used  . 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 Alcohol use: Yes    Alcohol/week: 2.0 standard drinks    Types: 2 Glasses of wine per week  . Drug use: No  . Sexual activity: Not on file  Other Topics Concern  . Not on file  Social History Narrative  . Not on file   Social Determinants of Health   Financial Resource Strain:   . Difficulty of Paying Living Expenses:   Food Insecurity:   . Worried About Charity fundraiser in the Last Year:   . Ran  Out of Food in the Last Year:   Transportation Needs:   . Lack of Transportation (Medical):   Marland Kitchen Lack of Transportation (Non-Medical):   Physical Activity:   . Days of Exercise per Week:   . Minutes of Exercise per Session:   Stress:   . Feeling of Stress :   Social Connections:   . Frequency of Communication with Friends and Family:   . Frequency of Social Gatherings with Friends and Family:   . Attends Religious Services:   . Active Member of Clubs or Organizations:   . Attends Archivist Meetings:   Marland Kitchen Marital Status:   Intimate  Partner Violence:   . Fear of Current or Ex-Partner:   . Emotionally Abused:   Marland Kitchen Physically Abused:   . Sexually Abused:    Family History  Problem Relation Age of Onset  . Prostate cancer Father   . Hypertension Brother   . Kidney disease Brother   . Stroke Maternal Grandmother   . Breast cancer Maternal Aunt   . Colon cancer Neg Hx   . Stomach cancer Neg Hx    Past Surgical History:  Procedure Laterality Date  . CESAREAN SECTION  1992  . LAPAROSCOPIC CHOLECYSTECTOMY  [place] Ranger, MD 04/22/19 1046

## 2019-12-07 ENCOUNTER — Other Ambulatory Visit: Payer: Self-pay

## 2019-12-07 ENCOUNTER — Encounter: Payer: Self-pay | Admitting: Internal Medicine

## 2019-12-07 ENCOUNTER — Ambulatory Visit: Payer: 59 | Admitting: Internal Medicine

## 2019-12-07 VITALS — BP 134/82 | HR 93 | Temp 97.9°F | Ht 66.0 in | Wt 249.0 lb

## 2019-12-07 DIAGNOSIS — R300 Dysuria: Secondary | ICD-10-CM | POA: Diagnosis not present

## 2019-12-07 LAB — POC URINALSYSI DIPSTICK (AUTOMATED)
Bilirubin, UA: NEGATIVE
Blood, UA: NEGATIVE
Glucose, UA: NEGATIVE
Ketones, UA: NEGATIVE
Leukocytes, UA: NEGATIVE
Nitrite, UA: NEGATIVE
Protein, UA: NEGATIVE
Spec Grav, UA: 1.02 (ref 1.010–1.025)
Urobilinogen, UA: 0.2 E.U./dL
pH, UA: 6 (ref 5.0–8.0)

## 2019-12-07 NOTE — Progress Notes (Signed)
Subjective:    Patient ID: Isabel [name], female    DOB: 1960/12/22, 59 y.o.   MRN: [id number]  HPI The patient is here for an acute visit.  Possible UTI -she started experiencing bladder pressure 2 days ago.  She just states pressure in that area.  She states a little frequency and some urgency.  She denies any pain with urination, blood in the stool, back pain or fever.  She may have a little nausea.  She denies any cloudy urine.   Medications and allergies reviewed with patient and updated if appropriate.  Patient Active Problem List   Diagnosis Date Noted  . Left knee pain 08/21/2017  . Routine general medical examination at a health care facility 09/29/2014  . GERD (gastroesophageal reflux disease)   . Morbid obesity ([place]) 04/18/2009    Current Outpatient Medications on File Prior to Visit  Medication Sig Dispense Refill  . aspirin EC 81 MG tablet Take 81 mg by mouth daily.    Marland Kitchen BIOTIN 5000 PO Take 5,000 mcg by mouth.    . Calcium Carb-Cholecalciferol (CALCIUM-VITAMIN D) 500-200 MG-UNIT tablet Take 1 tablet by mouth daily.    . Multiple Vitamins-Calcium (ONE-A-DAY WOMENS FORMULA) TABS Take by mouth daily. Reported on 02/02/2015    . Omega-3 Fatty Acids (FISH OIL) 1000 MG CAPS Take by mouth.      No current facility-administered medications on file prior to visit.    Past Medical History:  Diagnosis Date  . GERD (gastroesophageal reflux disease)   . HYPERGLYCEMIA   . OBESITY     Past Surgical History:  Procedure Laterality Date  . CESAREAN SECTION  1992  . LAPAROSCOPIC CHOLECYSTECTOMY  2005    Social History   Socioeconomic History  . Marital status: Married    Spouse name: Not on file  . Number of children: Not on file  . Years of education: Not on file  . Highest education level: Not on file  Occupational History  . Not on file  Tobacco Use  . Smoking status: Never Smoker  . Smokeless tobacco: Never Used  . Tobacco comment: Married, lives with  spouse. 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 Alcohol use: Yes    Alcohol/week: 2.0 standard drinks    Types: 2 Glasses of wine per week  . Drug use: No  . Sexual activity: Not on file  Other Topics Concern  . Not on file  Social History Narrative  . Not on file   Social Determinants of Health   Financial Resource Strain:   . Difficulty of Paying Living Expenses: Not on file  Food Insecurity:   . Worried About Charity fundraiser in the Last Year: Not on file  . Ran Out of Food in the Last Year: Not on file  Transportation Needs:   . Lack of Transportation (Medical): Not on file  . Lack of Transportation (Non-Medical): Not on file  Physical Activity:   . Days of Exercise per Week: Not on file  . Minutes of Exercise per Session: Not on file  Stress:   . Feeling of Stress : Not on file  Social Connections:   . Frequency of Communication with Friends and Family: Not on file  . Frequency of Social Gatherings with Friends and Family: Not on file  . Attends Religious Services: Not on file  . Active Member of Clubs or Organizations: Not on file  . Attends Archivist Meetings:  Not on file  . Marital Status: Not on file    Family History  Problem Relation Age of Onset  . Prostate cancer Father   . Hypertension Brother   . Kidney disease Brother   . Stroke Maternal Grandmother   . Breast cancer Maternal Aunt   . Colon cancer Neg Hx   . Stomach cancer Neg Hx     Review of Systems  Constitutional: Negative for fever.  Gastrointestinal: Positive for nausea.  Genitourinary: Positive for frequency ( alittle) and urgency. Negative for dysuria and hematuria.  Musculoskeletal: Negative for back pain.       Objective:   Vitals:   12/07/19 1511  BP: 134/82  Pulse: 93  Temp: 97.9 F (36.6 C)  SpO2: 98%   BP Readings from Last 3 Encounters:  12/07/19 134/82  04/21/19 (!) 156/74  03/24/19 134/86   Wt Readings from Last 3  Encounters:  12/07/19 249 lb (112.9 kg)  03/24/19 248 lb (112.5 kg)  03/17/18 242 lb (109.8 kg)   Body mass index is 40.19 kg/m.   Physical Exam Constitutional:      General: She is not in acute distress.    Appearance: Normal appearance. She is not ill-appearing.  HENT:     Head: Normocephalic and atraumatic.  Abdominal:     General: There is no distension.     Palpations: Abdomen is soft.     Tenderness: There is abdominal tenderness (Mild and suprapubic region). There is no guarding or rebound.  Skin:    General: Skin is warm and dry.  Neurological:     Mental Status: She is alert.            Assessment & Plan:    See Problem List for Assessment and Plan of chronic medical problems.    This visit occurred during the SARS-CoV-2 public health emergency.  Safety protocols were in place, including screening questions prior to the visit, additional usage of staff PPE, and extensive cleaning of exam room while observing appropriate contact time as indicated for disinfecting solutions.

## 2019-12-07 NOTE — Assessment & Plan Note (Signed)
Acute Minimal dysuria, but more bladder pressure for 2 days She has increased her fluids She denies any significant frequency, but maybe a little.  No other concerning symptoms Urine dip here looks very clean ?  UTI or other cause of symptoms We will hold off on antibiotics for now We will send for culture Advised to take AZO over-the-counter and if her symptoms worsen before culture comes back she will let me know

## 2019-12-07 NOTE — Patient Instructions (Signed)
Try taking over the counter AZO for a few days.   Continue increased fluids.   We will send your urine for culture and let you know the results.     Please call if there is no improvement in your symptoms or if they worsen.

## 2019-12-08 LAB — URINE CULTURE: Result:: NO GROWTH

## 2019-12-18 ENCOUNTER — Ambulatory Visit (INDEPENDENT_AMBULATORY_CARE_PROVIDER_SITE_OTHER): Payer: 59

## 2019-12-18 DIAGNOSIS — Z23 Encounter for immunization: Secondary | ICD-10-CM | POA: Diagnosis not present

## 2020-01-17 ENCOUNTER — Other Ambulatory Visit: Payer: Self-pay | Admitting: Obstetrics and Gynecology

## 2020-01-17 DIAGNOSIS — Z1231 Encounter for screening mammogram for malignant neoplasm of breast: Secondary | ICD-10-CM

## 2020-02-21 ENCOUNTER — Encounter (INDEPENDENT_AMBULATORY_CARE_PROVIDER_SITE_OTHER): Payer: Self-pay | Admitting: Otolaryngology

## 2020-02-21 ENCOUNTER — Other Ambulatory Visit: Payer: Self-pay

## 2020-02-21 ENCOUNTER — Ambulatory Visit (INDEPENDENT_AMBULATORY_CARE_PROVIDER_SITE_OTHER): Payer: 59 | Admitting: Otolaryngology

## 2020-02-21 VITALS — Temp 94.5°F

## 2020-02-21 DIAGNOSIS — H6123 Impacted cerumen, bilateral: Secondary | ICD-10-CM

## 2020-02-21 NOTE — Progress Notes (Signed)
HPI: Isabel [name] is a 60 y.o. female who returns today for evaluation of ear complaints.  She has complained of chronic crusting and itching inner ears on both sides.  She has not noted any hearing problems.  She wanted her ears checked and cleaned..  Past Medical History:  Diagnosis Date  . GERD (gastroesophageal reflux disease)   . HYPERGLYCEMIA   . OBESITY    Past Surgical History:  Procedure Laterality Date  . CESAREAN SECTION  1992  . LAPAROSCOPIC CHOLECYSTECTOMY  2005   Social History   Socioeconomic History  . Marital status: Married    Spouse name: Not on file  . Number of children: Not on file  . Years of education: Not on file  . Highest education level: Not on file  Occupational History  . Not on file  Tobacco Use  . Smoking status: Never Smoker  . Smokeless tobacco: Never Used  . 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 Alcohol use: Yes    Alcohol/week: 2.0 standard drinks    Types: 2 Glasses of wine per week  . Drug use: No  . Sexual activity: Not on file  Other Topics Concern  . Not on file  Social History Narrative  . Not on file   Social Determinants of Health   Financial Resource Strain: Not on file  Food Insecurity: Not on file  Transportation Needs: Not on file  Physical Activity: Not on file  Stress: Not on file  Social Connections: Not on file   Family History  Problem Relation Age of Onset  . Prostate cancer Father   . Hypertension Brother   . Kidney disease Brother   . Stroke Maternal Grandmother   . Breast cancer Maternal Aunt   . Colon cancer Neg Hx   . Stomach cancer Neg Hx    No Known Allergies Prior to Admission medications   Medication Sig Start Date End Date Taking? Authorizing Provider  aspirin EC 81 MG tablet Take 81 mg by mouth daily.    [provider]  BIOTIN 5000 PO Take 5,000 mcg by mouth.    [provider]  Calcium  Carb-Cholecalciferol (CALCIUM-VITAMIN D) 500-200 MG-UNIT tablet Take 1 tablet by mouth daily.    [provider]  Multiple Vitamins-Calcium (ONE-A-DAY WOMENS FORMULA) TABS Take by mouth daily. Reported on 02/02/2015    [provider]  Omega-3 Fatty Acids (FISH OIL) 1000 MG CAPS Take by mouth.     [provider]     Positive ROS: Otherwise negative  All other systems have been reviewed and were otherwise negative with the exception of those mentioned in the HPI and as above.  Physical Exam: Constitutional: Alert, well-appearing, no acute distress Ears: External ears without lesions or tenderness.  She has crusting and scabbing of the ear canals bilaterally on the lateral external portion of the external ear canals.  This is not obstructing the ear canals or TMs.  The more medial portion of the ear canals and TMs are clear bilaterally.  I removed some of the crusting in the office today using forceps curettes and suction. Nasal: External nose without lesions.. Clear nasal passages Oral: Lips and gums without lesions. Tongue and palate mucosa without lesions. Posterior oropharynx clear. Neck: No palpable adenopathy or masses Respiratory: Breathing comfortably  Skin: No facial/neck lesions or rash noted.  Cerumen impaction removal  Date/Time: 02/21/2020 12:59 PM Performed by: Melony Overly  E, MD Authorized by: Rozetta Nunnery, MD   Consent:    Consent obtained:  Verbal   Consent given by:  Patient   Risks discussed:  Pain and bleeding Procedure details:    Location:  L ear and R ear   Procedure type: curette, suction and forceps   Post-procedure details:    Inspection:  TM intact and canal normal   Hearing quality:  Improved   Patient tolerance of procedure:  Tolerated well, no immediate complications Comments:     Patient with scabbing and crusting of the lateral portion of both ear canals.  This was cleaned in the office using forceps  curettes and suction.  TMs were clear bilaterally.    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  Plan: This was cleaned in the office in recommended use of hydrocortisone cream or use of Diprolene 0.05% cream or lotion twice daily for 4 to 5 days and that should help with the itching as well as crusting. She will follow-up as needed.   Radene Journey, MD

## 2020-03-16 ENCOUNTER — Other Ambulatory Visit: Payer: Self-pay

## 2020-03-16 ENCOUNTER — Ambulatory Visit
Admission: RE | Admit: 2020-03-16 | Discharge: 2020-03-16 | Disposition: A | Discharge status: Home / Self Care | Payer: 59 | Source: Ambulatory Visit | Attending: Obstetrics and Gynecology | Admitting: Obstetrics and Gynecology

## 2020-03-16 DIAGNOSIS — Z1231 Encounter for screening mammogram for malignant neoplasm of breast: Secondary | ICD-10-CM

## 2020-03-24 ENCOUNTER — Encounter: Payer: 59 | Admitting: Internal Medicine

## 2020-03-29 ENCOUNTER — Ambulatory Visit (INDEPENDENT_AMBULATORY_CARE_PROVIDER_SITE_OTHER): Payer: 59 | Admitting: Internal Medicine

## 2020-03-29 ENCOUNTER — Encounter: Payer: Self-pay | Admitting: Internal Medicine

## 2020-03-29 ENCOUNTER — Other Ambulatory Visit: Payer: Self-pay

## 2020-03-29 VITALS — BP 126/80 | HR 96 | Temp 98.3°F | Resp 18 | Ht 66.0 in | Wt 249.8 lb

## 2020-03-29 DIAGNOSIS — Z0000 Encounter for general adult medical examination without abnormal findings: Secondary | ICD-10-CM | POA: Diagnosis not present

## 2020-03-29 DIAGNOSIS — M25562 Pain in left knee: Secondary | ICD-10-CM | POA: Diagnosis not present

## 2020-03-29 DIAGNOSIS — G8929 Other chronic pain: Secondary | ICD-10-CM

## 2020-03-29 LAB — CBC
HCT: 41.5 % (ref 36.0–46.0)
Hemoglobin: 14 g/dL (ref 12.0–15.0)
MCHC: 33.6 g/dL (ref 30.0–36.0)
MCV: 83.4 fl (ref 78.0–100.0)
Platelets: 299 10*3/uL (ref 150.0–400.0)
RBC: 4.98 Mil/uL (ref 3.87–5.11)
RDW: 14.3 % (ref 11.5–15.5)
WBC: 6.4 10*3/uL (ref 4.0–10.5)

## 2020-03-29 LAB — COMPREHENSIVE METABOLIC PANEL
ALT: 28 U/L (ref 0–35)
AST: 26 U/L (ref 0–37)
Albumin: 4.4 g/dL (ref 3.5–5.2)
Alkaline Phosphatase: 49 U/L (ref 39–117)
BUN: 11 mg/dL (ref 6–23)
CO2: 30 mEq/L (ref 19–32)
Calcium: 9.6 mg/dL (ref 8.4–10.5)
Chloride: 102 mEq/L (ref 96–112)
Creatinine, Ser: 0.83 mg/dL (ref 0.40–1.20)
GFR: 77.75 mL/min (ref >60.00)
Glucose, Bld: 111 mg/dL — ABNORMAL HIGH (ref 70–99)
Potassium: 4.3 mEq/L (ref 3.5–5.1)
Sodium: 137 mEq/L (ref 135–145)
Total Bilirubin: 0.6 mg/dL (ref 0.2–1.2)
Total Protein: 7.8 g/dL (ref 6.0–8.3)

## 2020-03-29 LAB — LIPID PANEL
Cholesterol: 144 mg/dL (ref 0–200)
HDL: 48.2 mg/dL (ref >39.00)
LDL Cholesterol: 74 mg/dL (ref 0–99)
NonHDL: 95.43
Total CHOL/HDL Ratio: 3
Triglycerides: 105 mg/dL (ref 0.0–149.0)
VLDL: 21 mg/dL (ref 0.0–40.0)

## 2020-03-29 LAB — HEMOGLOBIN A1C: Hgb A1c MFr Bld: 6 % (ref 4.6–6.5)

## 2020-03-29 NOTE — Patient Instructions (Signed)
Health Maintenance, Female Adopting a healthy lifestyle and getting preventive care are important in promoting health and wellness. Ask your health care provider about:  The right schedule for you to have regular tests and exams.  Things you can do on your own to prevent diseases and keep yourself healthy. What should I know about diet, weight, and exercise? Eat a healthy diet  Eat a diet that includes plenty of vegetables, fruits, low-fat dairy products, and lean protein.  Do not eat a lot of foods that are high in solid fats, added sugars, or sodium.   Maintain a healthy weight Body mass index (BMI) is used to identify weight problems. It estimates body fat based on height and weight. Your health care provider can help determine your BMI and help you achieve or maintain a healthy weight. Get regular exercise Get regular exercise. This is one of the most important things you can do for your health. Most adults should:  Exercise for at least 150 minutes each week. The exercise should increase your heart rate and make you sweat (moderate-intensity exercise).  Do strengthening exercises at least twice a week. This is in addition to the moderate-intensity exercise.  Spend less time sitting. Even light physical activity can be beneficial. Watch cholesterol and blood lipids Have your blood tested for lipids and cholesterol at 59 years of age, then have this test every 5 years. Have your cholesterol levels checked more often if:  Your lipid or cholesterol levels are high.  You are older than 59 years of age.  You are at high risk for heart disease. What should I know about cancer screening? Depending on your health history and family history, you may need to have cancer screening at various ages. This may include screening for:  Breast cancer.  Cervical cancer.  Colorectal cancer.  Skin cancer.  Lung cancer. What should I know about heart disease, diabetes, and high blood  pressure? Blood pressure and heart disease  High blood pressure causes heart disease and increases the risk of stroke. This is more likely to develop in people who have high blood pressure readings, are of African descent, or are overweight.  Have your blood pressure checked: ? Every 3-5 years if you are 18-39 years of age. ? Every year if you are 40 years old or older. Diabetes Have regular diabetes screenings. This checks your fasting blood sugar level. Have the screening done:  Once every three years after age 40 if you are at a normal weight and have a low risk for diabetes.  More often and at a younger age if you are overweight or have a high risk for diabetes. What should I know about preventing infection? Hepatitis B If you have a higher risk for hepatitis B, you should be screened for this virus. Talk with your health care provider to find out if you are at risk for hepatitis B infection. Hepatitis C Testing is recommended for:  Everyone born from 1945 through 1965.  Anyone with known risk factors for hepatitis C. Sexually transmitted infections (STIs)  Get screened for STIs, including gonorrhea and chlamydia, if: ? You are sexually active and are younger than 59 years of age. ? You are older than 59 years of age and your health care provider tells you that you are at risk for this type of infection. ? Your sexual activity has changed since you were last screened, and you are at increased risk for chlamydia or gonorrhea. Ask your health care provider   if you are at risk.  Ask your health care provider about whether you are at high risk for HIV. Your health care provider may recommend a prescription medicine to help prevent HIV infection. If you choose to take medicine to prevent HIV, you should first get tested for HIV. You should then be tested every 3 months for as long as you are taking the medicine. Pregnancy  If you are about to stop having your period (premenopausal) and  you may become pregnant, seek counseling before you get pregnant.  Take 400 to 800 micrograms (mcg) of folic acid every day if you become pregnant.  Ask for birth control (contraception) if you want to prevent pregnancy. Osteoporosis and menopause Osteoporosis is a disease in which the bones lose minerals and strength with aging. This can result in bone fractures. If you are 65 years old or older, or if you are at risk for osteoporosis and fractures, ask your health care provider if you should:  Be screened for bone loss.  Take a calcium or vitamin D supplement to lower your risk of fractures.  Be given hormone replacement therapy (HRT) to treat symptoms of menopause. Follow these instructions at home: Lifestyle  Do not use any products that contain nicotine or tobacco, such as cigarettes, e-cigarettes, and chewing tobacco. If you need help quitting, ask your health care provider.  Do not use street drugs.  Do not share needles.  Ask your health care provider for help if you need support or information about quitting drugs. Alcohol use  Do not drink alcohol if: ? Your health care provider tells you not to drink. ? You are pregnant, may be pregnant, or are planning to become pregnant.  If you drink alcohol: ? Limit how much you use to 0-1 drink a day. ? Limit intake if you are breastfeeding.  Be aware of how much alcohol is in your drink. In the [place] one drink equals one 12 oz bottle of beer (355 mL), one 5 oz glass of wine (148 mL), or one 1 oz glass of hard liquor (44 mL). General instructions  Schedule regular health, dental, and eye exams.  Stay current with your vaccines.  Tell your health care provider if: ? You often feel depressed. ? You have ever been abused or do not feel safe at home. Summary  Adopting a healthy lifestyle and getting preventive care are important in promoting health and wellness.  Follow your health care provider's instructions about healthy  diet, exercising, and getting tested or screened for diseases.  Follow your health care provider's instructions on monitoring your cholesterol and blood pressure. This information is not intended to replace advice given to you by your health care provider. Make sure you discuss any questions you have with your health care provider. Document Revised: 01/14/2018 Document Reviewed: 01/14/2018 Elsevier Patient Education  2021 [employer]vier Inc.  

## 2020-03-29 NOTE — Progress Notes (Unsigned)
   Subjective:   Patient ID: Isabel [name], female    DOB: May 20, 1960, 60 y.o.   MRN: [id number]  HPI The patient is a 60 YO female coming in for physical.   PMH, [place], social history reviewed and updated  Review of Systems  Constitutional: Negative.   HENT: Negative.   Eyes: Negative.   Respiratory: Negative for cough, chest tightness and shortness of breath.   Cardiovascular: Negative for chest pain, palpitations and leg swelling.  Gastrointestinal: Negative for abdominal distention, abdominal pain, constipation, diarrhea, nausea and vomiting.  Musculoskeletal: Negative.   Skin: Negative.   Neurological: Negative.   Psychiatric/Behavioral: Negative.     Objective:  Physical Exam Constitutional:      Appearance: She is well-developed and well-nourished. She is obese.  HENT:     Head: Normocephalic and atraumatic.  Eyes:     Extraocular Movements: EOM normal.  Cardiovascular:     Rate and Rhythm: Normal rate and regular rhythm.  Pulmonary:     Effort: Pulmonary effort is normal. No respiratory distress.     Breath sounds: Normal breath sounds. No wheezing or rales.  Abdominal:     General: Bowel sounds are normal. There is no distension.     Palpations: Abdomen is soft.     Tenderness: There is no abdominal tenderness. There is no rebound.  Musculoskeletal:        General: No edema.     Cervical back: Normal range of motion.  Skin:    General: Skin is warm and dry.  Neurological:     Mental Status: She is alert and oriented to person, place, and time.     Coordination: Coordination normal.  Psychiatric:        Mood and Affect: Mood and affect normal.     Vitals:   03/29/20 0805  BP: 126/80  Pulse: 96  Resp: 18  Temp: 98.3 F (36.8 C)  TempSrc: Oral  SpO2: 98%  Weight: 249 lb 12.8 oz (113.3 kg)  Height: 5\' 6"  (1.676 m)    This visit occurred during the SARS-CoV-2 public health emergency.  Safety protocols were in place, including screening questions  prior to the visit, additional usage of staff PPE, and extensive cleaning of exam room while observing appropriate contact time as indicated for disinfecting solutions.   Assessment & Plan:

## 2020-03-30 NOTE — Assessment & Plan Note (Signed)
Flu shot up to date. Covid-19 up to date including booster. Shingrix complete. Tetanus up to date. Colonoscopy due 2023. Mammogram up to date, pap smear due reminded. Counseled about sun safety and mole surveillance. Counseled about the dangers of distracted driving. Given 10 year screening recommendations.

## 2020-03-30 NOTE — Assessment & Plan Note (Signed)
Weight is stable but not down. Counseled about diet and exercise to help.

## 2020-03-30 NOTE — Assessment & Plan Note (Signed)
She will work on losing weight to help with this as well as getting more active.

## 2020-11-09 ENCOUNTER — Other Ambulatory Visit: Payer: Self-pay

## 2020-11-09 ENCOUNTER — Ambulatory Visit (INDEPENDENT_AMBULATORY_CARE_PROVIDER_SITE_OTHER): Payer: 59

## 2020-11-09 DIAGNOSIS — Z23 Encounter for immunization: Secondary | ICD-10-CM

## 2020-12-07 ENCOUNTER — Other Ambulatory Visit: Payer: Self-pay | Admitting: Obstetrics and Gynecology

## 2020-12-07 DIAGNOSIS — Z1231 Encounter for screening mammogram for malignant neoplasm of breast: Secondary | ICD-10-CM

## 2021-03-19 ENCOUNTER — Ambulatory Visit
Admission: RE | Admit: 2021-03-19 | Discharge: 2021-03-19 | Disposition: A | Discharge status: Home / Self Care | Payer: 59 | Source: Ambulatory Visit | Attending: Obstetrics and Gynecology | Admitting: Obstetrics and Gynecology

## 2021-03-19 DIAGNOSIS — Z1231 Encounter for screening mammogram for malignant neoplasm of breast: Secondary | ICD-10-CM

## 2021-03-30 ENCOUNTER — Ambulatory Visit (INDEPENDENT_AMBULATORY_CARE_PROVIDER_SITE_OTHER): Payer: 59 | Admitting: Internal Medicine

## 2021-03-30 ENCOUNTER — Other Ambulatory Visit: Payer: Self-pay

## 2021-03-30 ENCOUNTER — Encounter: Payer: Self-pay | Admitting: Internal Medicine

## 2021-03-30 VITALS — BP 122/64 | HR 98 | Resp 18 | Ht 66.0 in | Wt 236.8 lb

## 2021-03-30 DIAGNOSIS — E669 Obesity, unspecified: Secondary | ICD-10-CM | POA: Diagnosis not present

## 2021-03-30 DIAGNOSIS — Z0000 Encounter for general adult medical examination without abnormal findings: Secondary | ICD-10-CM

## 2021-03-30 LAB — COMPREHENSIVE METABOLIC PANEL
ALT: 27 U/L (ref 0–35)
AST: 23 U/L (ref 0–37)
Albumin: 4.4 g/dL (ref 3.5–5.2)
Alkaline Phosphatase: 49 U/L (ref 39–117)
BUN: 13 mg/dL (ref 6–23)
CO2: 32 mEq/L (ref 19–32)
Calcium: 9.9 mg/dL (ref 8.4–10.5)
Chloride: 104 mEq/L (ref 96–112)
Creatinine, Ser: 0.82 mg/dL (ref 0.40–1.20)
GFR: 78.34 mL/min (ref >60.00)
Glucose, Bld: 110 mg/dL — ABNORMAL HIGH (ref 70–99)
Potassium: 4.2 mEq/L (ref 3.5–5.1)
Sodium: 139 mEq/L (ref 135–145)
Total Bilirubin: 0.4 mg/dL (ref 0.2–1.2)
Total Protein: 8.1 g/dL (ref 6.0–8.3)

## 2021-03-30 LAB — CBC
HCT: 41.1 % (ref 36.0–46.0)
Hemoglobin: 13.8 g/dL (ref 12.0–15.0)
MCHC: 33.7 g/dL (ref 30.0–36.0)
MCV: 82.7 fl (ref 78.0–100.0)
Platelets: 297 10*3/uL (ref 150.0–400.0)
RBC: 4.96 Mil/uL (ref 3.87–5.11)
RDW: 13.6 % (ref 11.5–15.5)
WBC: 5.5 10*3/uL (ref 4.0–10.5)

## 2021-03-30 LAB — LIPID PANEL
Cholesterol: 131 mg/dL (ref 0–200)
HDL: 41.3 mg/dL (ref >39.00)
LDL Cholesterol: 77 mg/dL (ref 0–99)
NonHDL: 89.3
Total CHOL/HDL Ratio: 3
Triglycerides: 62 mg/dL (ref 0.0–149.0)
VLDL: 12.4 mg/dL (ref 0.0–40.0)

## 2021-03-30 LAB — HEMOGLOBIN A1C: Hgb A1c MFr Bld: 6.3 % (ref 4.6–6.5)

## 2021-03-30 NOTE — Patient Instructions (Signed)
If you want the heart test let us know.

## 2021-03-30 NOTE — Assessment & Plan Note (Signed)
Flu shot up to date. Covid-19 up to date. Shingrix complete. Tetanus up to date. Colonoscopy due Dec 2023. Mammogram up to date, pap smear up to date with gyn. Counseled about sun safety and mole surveillance. Counseled about the dangers of distracted driving. Given 10 year screening recommendations.

## 2021-03-30 NOTE — Progress Notes (Signed)
° °  Subjective:   Patient ID: Isabel [name], female    DOB: 1960-07-31, 61 y.o.   MRN: [id number]  HPI The patient is a 61 YO female coming in for physical.   PMH, [place], social history reviewed and updated  Review of Systems  Constitutional: Negative.   HENT: Negative.    Eyes: Negative.   Respiratory:  Negative for cough, chest tightness and shortness of breath.   Cardiovascular:  Negative for chest pain, palpitations and leg swelling.  Gastrointestinal:  Negative for abdominal distention, abdominal pain, constipation, diarrhea, nausea and vomiting.  Musculoskeletal: Negative.   Skin: Negative.   Neurological: Negative.   Psychiatric/Behavioral: Negative.     Objective:  Physical Exam Constitutional:      Appearance: She is well-developed.  HENT:     Head: Normocephalic and atraumatic.  Cardiovascular:     Rate and Rhythm: Normal rate and regular rhythm.  Pulmonary:     Effort: Pulmonary effort is normal. No respiratory distress.     Breath sounds: Normal breath sounds. No wheezing or rales.  Abdominal:     General: Bowel sounds are normal. There is no distension.     Palpations: Abdomen is soft.     Tenderness: There is no abdominal tenderness. There is no rebound.  Musculoskeletal:     Cervical back: Normal range of motion.  Skin:    General: Skin is warm and dry.  Neurological:     Mental Status: She is alert and oriented to person, place, and time.     Coordination: Coordination normal.    Vitals:   03/30/21 0807  BP: 122/64  Pulse: 98  Resp: 18  SpO2: 99%  Weight: 236 lb 12.8 oz (107.4 kg)  Height: 5\' 6"  (1.676 m)    This visit occurred during the SARS-CoV-2 public health emergency.  Safety protocols were in place, including screening questions prior to the visit, additional usage of staff PPE, and extensive cleaning of exam room while observing appropriate contact time as indicated for disinfecting solutions.   Assessment & Plan:

## 2021-03-30 NOTE — Assessment & Plan Note (Signed)
BMI 38 and she has lost weight since last year intentionally through diet changes. Encouraged to continue with those changes.

## 2021-05-25 ENCOUNTER — Encounter: Payer: Self-pay | Admitting: Internal Medicine

## 2021-05-25 LAB — HM DIABETES EYE EXAM

## 2021-11-22 ENCOUNTER — Ambulatory Visit (INDEPENDENT_AMBULATORY_CARE_PROVIDER_SITE_OTHER): Admitting: *Deleted

## 2021-11-22 DIAGNOSIS — Z23 Encounter for immunization: Secondary | ICD-10-CM | POA: Diagnosis not present

## 2021-11-22 NOTE — Progress Notes (Signed)
Administered regular flu shot left deltoid. Pt tolerated well. 

## 2022-02-07 ENCOUNTER — Other Ambulatory Visit: Payer: Self-pay | Admitting: Obstetrics and Gynecology

## 2022-02-07 DIAGNOSIS — Z1231 Encounter for screening mammogram for malignant neoplasm of breast: Secondary | ICD-10-CM

## 2022-02-14 ENCOUNTER — Encounter: Payer: Self-pay | Admitting: Internal Medicine

## 2022-02-18 ENCOUNTER — Encounter: Payer: Self-pay | Admitting: Internal Medicine

## 2022-04-01 ENCOUNTER — Encounter: Payer: Self-pay | Admitting: Internal Medicine

## 2022-04-01 ENCOUNTER — Ambulatory Visit (INDEPENDENT_AMBULATORY_CARE_PROVIDER_SITE_OTHER): Admitting: Internal Medicine

## 2022-04-01 VITALS — BP 128/60 | HR 89 | Temp 97.7°F | Ht 66.0 in | Wt 236.4 lb

## 2022-04-01 DIAGNOSIS — Z1211 Encounter for screening for malignant neoplasm of colon: Secondary | ICD-10-CM

## 2022-04-01 DIAGNOSIS — E669 Obesity, unspecified: Secondary | ICD-10-CM

## 2022-04-01 DIAGNOSIS — Z0000 Encounter for general adult medical examination without abnormal findings: Secondary | ICD-10-CM

## 2022-04-01 DIAGNOSIS — R7303 Prediabetes: Secondary | ICD-10-CM | POA: Diagnosis not present

## 2022-04-01 LAB — COMPREHENSIVE METABOLIC PANEL
ALT: 42 U/L — ABNORMAL HIGH (ref 0–35)
AST: 33 U/L (ref 0–37)
Albumin: 4.6 g/dL (ref 3.5–5.2)
Alkaline Phosphatase: 55 U/L (ref 39–117)
BUN: 13 mg/dL (ref 6–23)
CO2: 29 mEq/L (ref 19–32)
Calcium: 10.3 mg/dL (ref 8.4–10.5)
Chloride: 104 mEq/L (ref 96–112)
Creatinine, Ser: 0.8 mg/dL (ref 0.40–1.20)
GFR: 80.13 mL/min (ref >60.00)
Glucose, Bld: 104 mg/dL — ABNORMAL HIGH (ref 70–99)
Potassium: 4.8 mEq/L (ref 3.5–5.1)
Sodium: 140 mEq/L (ref 135–145)
Total Bilirubin: 0.4 mg/dL (ref 0.2–1.2)
Total Protein: 8.5 g/dL — ABNORMAL HIGH (ref 6.0–8.3)

## 2022-04-01 LAB — URINALYSIS, ROUTINE W REFLEX MICROSCOPIC
Bilirubin Urine: NEGATIVE
Hgb urine dipstick: NEGATIVE
Ketones, ur: NEGATIVE
Leukocytes,Ua: NEGATIVE
Nitrite: NEGATIVE
RBC / HPF: NONE SEEN (ref 0–?)
Specific Gravity, Urine: 1.02 (ref 1.000–1.030)
Total Protein, Urine: NEGATIVE
Urine Glucose: NEGATIVE
Urobilinogen, UA: 0.2 (ref 0.0–1.0)
pH: 6.5 (ref 5.0–8.0)

## 2022-04-01 LAB — LIPID PANEL
Cholesterol: 151 mg/dL (ref 0–200)
HDL: 37.6 mg/dL — ABNORMAL LOW (ref >39.00)
LDL Cholesterol: 96 mg/dL (ref 0–99)
NonHDL: 113.4
Total CHOL/HDL Ratio: 4
Triglycerides: 89 mg/dL (ref 0.0–149.0)
VLDL: 17.8 mg/dL (ref 0.0–40.0)

## 2022-04-01 LAB — CBC
HCT: 44.3 % (ref 36.0–46.0)
Hemoglobin: 14.7 g/dL (ref 12.0–15.0)
MCHC: 33.2 g/dL (ref 30.0–36.0)
MCV: 83.9 fl (ref 78.0–100.0)
Platelets: 357 10*3/uL (ref 150.0–400.0)
RBC: 5.27 Mil/uL — ABNORMAL HIGH (ref 3.87–5.11)
RDW: 13.7 % (ref 11.5–15.5)
WBC: 5.8 10*3/uL (ref 4.0–10.5)

## 2022-04-01 LAB — HEMOGLOBIN A1C: Hgb A1c MFr Bld: 6.2 % (ref 4.6–6.5)

## 2022-04-01 NOTE — Assessment & Plan Note (Signed)
Flu shot up to date. Covid-19 up to date. Shingrix complete. Tetanus up to date. Colonoscopy ordered cologuard. Mammogram up to date, pap smear up to date. Counseled about sun safety and mole surveillance. Counseled about the dangers of distracted driving. Given 10 year screening recommendations.

## 2022-04-01 NOTE — Assessment & Plan Note (Signed)
With pre-diabetes. Checking labs. Counseled about weight and diet/exercise.

## 2022-04-01 NOTE — Assessment & Plan Note (Signed)
Checking HgA1c today. 

## 2022-04-01 NOTE — Patient Instructions (Signed)
We have sent in the wegovy to see if it is covered.

## 2022-04-01 NOTE — Progress Notes (Signed)
   Subjective:   Patient ID: Isabel [name], female    DOB: November 24, 1960, 62 y.o.   MRN: [id number]  HPI The patient is here for physical.  PMH, [facility], social history reviewed and updated  Review of Systems  Constitutional: Negative.   HENT: Negative.    Eyes: Negative.   Respiratory:  Negative for cough, chest tightness and shortness of breath.   Cardiovascular:  Negative for chest pain, palpitations and leg swelling.  Gastrointestinal:  Negative for abdominal distention, abdominal pain, constipation, diarrhea, nausea and vomiting.  Musculoskeletal: Negative.   Skin: Negative.   Neurological: Negative.   Psychiatric/Behavioral: Negative.      Objective:  Physical Exam Constitutional:      Appearance: She is well-developed. She is obese.  HENT:     Head: Normocephalic and atraumatic.  Cardiovascular:     Rate and Rhythm: Normal rate and regular rhythm.  Pulmonary:     Effort: Pulmonary effort is normal. No respiratory distress.     Breath sounds: Normal breath sounds. No wheezing or rales.  Abdominal:     General: Bowel sounds are normal. There is no distension.     Palpations: Abdomen is soft.     Tenderness: There is no abdominal tenderness. There is no rebound.  Musculoskeletal:     Cervical back: Normal range of motion.  Skin:    General: Skin is warm and dry.  Neurological:     Mental Status: She is alert and oriented to person, place, and time.     Coordination: Coordination normal.     Vitals:   04/01/22 0923  BP: 128/60  Pulse: 89  Temp: 97.7 F (36.5 C)  TempSrc: Oral  SpO2: 99%  Weight: 236 lb 6 oz (107.2 kg)  Height: 5' 6"$  (1.676 m)    Assessment & Plan:

## 2022-04-02 ENCOUNTER — Ambulatory Visit
Admission: RE | Admit: 2022-04-02 | Discharge: 2022-04-02 | Disposition: A | Discharge status: Home / Self Care | Source: Ambulatory Visit | Attending: Obstetrics and Gynecology | Admitting: Obstetrics and Gynecology

## 2022-04-02 DIAGNOSIS — Z1231 Encounter for screening mammogram for malignant neoplasm of breast: Secondary | ICD-10-CM

## 2022-04-04 ENCOUNTER — Other Ambulatory Visit: Payer: Self-pay | Admitting: Obstetrics and Gynecology

## 2022-04-04 DIAGNOSIS — R928 Other abnormal and inconclusive findings on diagnostic imaging of breast: Secondary | ICD-10-CM

## 2022-04-18 ENCOUNTER — Ambulatory Visit
Admission: RE | Admit: 2022-04-18 | Discharge: 2022-04-18 | Disposition: A | Discharge status: Home / Self Care | Source: Ambulatory Visit | Attending: Obstetrics and Gynecology | Admitting: Obstetrics and Gynecology

## 2022-04-18 ENCOUNTER — Other Ambulatory Visit: Payer: Self-pay | Admitting: Obstetrics and Gynecology

## 2022-04-18 DIAGNOSIS — N6489 Other specified disorders of breast: Secondary | ICD-10-CM

## 2022-04-18 DIAGNOSIS — R928 Other abnormal and inconclusive findings on diagnostic imaging of breast: Secondary | ICD-10-CM

## 2022-04-30 ENCOUNTER — Ambulatory Visit
Admission: RE | Admit: 2022-04-30 | Discharge: 2022-04-30 | Disposition: A | Discharge status: Home / Self Care | Source: Ambulatory Visit | Attending: Obstetrics and Gynecology | Admitting: Obstetrics and Gynecology

## 2022-04-30 DIAGNOSIS — N6489 Other specified disorders of breast: Secondary | ICD-10-CM

## 2022-04-30 HISTORY — PX: BREAST BIOPSY: SHX20

## 2022-05-01 DIAGNOSIS — C50919 Malignant neoplasm of unspecified site of unspecified female breast: Secondary | ICD-10-CM

## 2022-05-01 HISTORY — DX: Malignant neoplasm of unspecified site of unspecified female breast: C50.919

## 2022-05-13 ENCOUNTER — Ambulatory Visit: Payer: Self-pay | Admitting: Surgery

## 2022-05-13 ENCOUNTER — Other Ambulatory Visit: Payer: Self-pay | Admitting: *Deleted

## 2022-05-13 DIAGNOSIS — C50912 Malignant neoplasm of unspecified site of left female breast: Secondary | ICD-10-CM

## 2022-05-13 DIAGNOSIS — C50212 Malignant neoplasm of upper-inner quadrant of left female breast: Secondary | ICD-10-CM

## 2022-05-13 NOTE — H&P (Signed)
Subjective   Chief Complaint: New Consultation and Breast Cancer     History of Present Illness: Isabel [name] is a 62 y.o. female who is seen today as an office consultation at the request of Dr. Henderson Cloud for evaluation of New Consultation and Breast Cancer .    This is a 62 year old female with no family history of breast cancer first-degree relatives who presents after recent diagnosis of breast cancer.  The patient is in relatively good health.  She is prediabetic but is on no medication for diabetes.  She was having noted breast problems when she recently underwent routine screening mammogram.  This showed an area of asymmetry in the upper inner left breast.  Ultrasound showed no sonographic correlate.  She underwent stereotactic biopsy of this area. 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  The patient is now referred for surgical evaluation.  She is accompanied by her husband.  The patient is in relatively good health and exercises regularly.  Review of Systems: A complete review of systems was obtained from the patient.  I have reviewed this information and discussed as appropriate with the patient.  See HPI as well for other ROS.  Review of Systems  Constitutional: Negative.   HENT: Negative.    Eyes: Negative.   Respiratory: Negative.    Cardiovascular: Negative.   Gastrointestinal: Negative.   Genitourinary: Negative.   Musculoskeletal: Negative.   Skin: Negative.   Neurological: Negative.   Endo/Heme/Allergies: Negative.   Psychiatric/Behavioral: Negative.        Medical History: Past Medical History:  Diagnosis Date   GERD (gastroesophageal reflux disease)    History of cancer    Hyperlipidemia     Patient Active Problem List  Diagnosis   Invasive ductal carcinoma of breast, left (CMS/HHS-HCC)   Obesity (BMI 35.0-39.9 without comorbidity), unspecified   Pre-diabetes    Past Surgical  History:  Procedure Laterality Date   CESAREAN SECTION     CHOLECYSTECTOMY       No Known Allergies  Current Outpatient Medications on File Prior to Visit  Medication Sig Dispense Refill   biotin 5 mg capsule Take by mouth     calcium carbonate-vitamin D3 (OS-CAL 500+D) 500 mg-5 mcg (200 unit) tablet Take 1 tablet by mouth once daily     omega-3 fatty acids/fish oil (FISH OIL) 340-1,000 mg capsule Take by mouth     No current facility-administered medications on file prior to visit.    History reviewed. No pertinent family history.   Social History   Tobacco Use  Smoking Status Never  Smokeless Tobacco Never     Social History   Socioeconomic History   Marital status: Married  Tobacco Use   Smoking status: Never   Smokeless tobacco: Never  Substance and Sexual Activity   Alcohol use: Not Currently   Drug use: Never    Objective:    Vitals:   05/13/22 1131  BP: (!) 142/84  Pulse: 98  Temp: 36.7 C (98 F)  SpO2: 98%  Weight: (!) 102.1 kg (225 lb)  Height: 167.6 cm (5\' 6" )    Body mass index is 36.32 kg/m.  Physical Exam   Constitutional:  WDWN in NAD, conversant, no obvious deformities; lying in bed comfortably Eyes:  Pupils equal, round; sclera anicteric; moist conjunctiva; no lid lag HENT:  Oral mucosa moist; good dentition  Neck:  No masses palpated, trachea midline; no thyromegaly Lungs:  CTA bilaterally; normal respiratory  effort Breasts:  symmetric, no nipple changes; no palpable masses or lymphadenopathy on either side.  Slight ecchymosis in the upper inner left breast. CV:  Regular rate and rhythm; no murmurs; extremities well-perfused with no edema Abd:  +bowel sounds, soft, non-tender, no palpable organomegaly; no palpable hernias Musc: Normal gait; no apparent clubbing or cyanosis in extremities Lymphatic:  No palpable cervical or axillary lymphadenopathy Skin:  Warm, dry; no sign of jaundice Psychiatric - alert and oriented x 4; calm mood  and affect   Labs, Imaging and Diagnostic Testing: Diagnosis Breast, left, needle core biopsy, upper inner, venus clip - INVASIVE DUCTAL CARCINOMA, SEE NOTE - DUCTAL CARCINOMA IN SITU, INTERMEDIATE TO HIGH GRADE - TUBULE FORMATION: SCORE 3 - NUCLEAR PLEOMORPHISM: SCORE 2 - MITOTIC COUNT: SCORE 2 - TOTAL SCORE: 7 - OVERALL GRADE: 2 - LYMPHOVASCULAR INVASION: NOT IDENTIFIED - CANCER LENGTH: 0.7 CM - CALCIFICATIONS: NOT IDENTIFIED - OTHER FINDINGS: NONE Diagnosis Note Dr. Kenyon AnaLeGolvan reviewed the case and concurs with the above diagnosis. A breast prognostic profile (ER, PR, Ki-67 and HER2) is pending and will be reported in an addendum. [facility] was notified on 05/01/2022. Holley BoucheNilesh Kashikar MD Pathologist, Electronic Signature (Case signed 05/01/2022) Specimen Gross and Clinical Information Specimen Comment TIF: 810 am CIT: < 10 min, screening detected abnormality, asymmetry Specimen(s) Obtained: Breast, left, needle core biopsy, upper inner, venus clip 1 of 3 FINAL for Fuhr, Ricki RodriguezKATHEY M ([id number]) Specimen Clinical Information FCC, PASH, R/O malignancy Gross Received in formalin, time in formalin 8:10 a.m. and CIT less than 10 minutes, are fragments and cores of soft tan yellow tissue measuring 2 x 2 x 0.6 cm in aggregate. The specimen is entirely submitted in two cassettes. (GRP:kh 04/30/22) Stain(s) used in Diagnosis: The following stain(s) were used in diagnosing the case: Her2 FISH. The control(s) stained appropriately. ADDITIONAL INFORMATION: 1B- Breast, left, needle core biopsy FLUORESCENCE IN-SITU HYBRIDIZATION Results: GROUP 5: HER2 **NEGATIVE** Equivocal form of amplification of the HER2 gene was detected in the IHC 2+ tissue sample received from this individual. HER2 FISH was performed by a technologist and cell imaging and analysis on the BioView. RATIO OF HER2/CEN17 SIGNALS 1.29 AVERAGE HER2 COPY NUMBER PER CELL 3.10 The ratio of  HER2/CEN 17 is within the range < 2.0 of HER2/CEN 17 and a copy number of HER2 signals per cell is <4.0. Arch Pathol Lab Med 1:1,2018 Jerene BearsFred Picklesimer MD Pathologist, Electronic Signature ( Signed 05/03/2022) Breast, left, needle core biopsy, upper inner, venus clip PROGNOSTIC INDICATORS Results: IMMUNOHISTOCHEMICAL AND MORPHOMETRIC ANALYSIS PERFORMED MANUALLY The tumor cells are EQUIVOCAL for Her2 (2+). Her2 by FISH will be performed and the results reported separately. Estrogen Receptor: 100%, POSITIVE, STRONG STAINING INTENSITY Progesterone Receptor: 0%, NEGATIVE Proliferation Marker Ki67: 10% COMMENT: The negative hormone receptor study(ies) in this case has an internal positive control.  CLINICAL DATA:  The patient was called back for left breast asymmetry   EXAM: DIGITAL DIAGNOSTIC UNILATERAL LEFT MAMMOGRAM WITH TOMOSYNTHESIS; ULTRASOUND LEFT BREAST LIMITED   TECHNIQUE: Left digital diagnostic mammography and breast tomosynthesis was performed.; Targeted ultrasound examination of the left breast was performed.   COMPARISON:  Previous exam(s).   ACR Breast Density Category b: There are scattered areas of fibroglandular density.   FINDINGS: The left breast asymmetry with possible associated distortion is identified in the medial central left breast at a mid depth.   Targeted ultrasound is performed, showing no definitive sonographic correlate for the left breast focal asymmetry with possible distortion. No axillary adenopathy.  IMPRESSION: Indeterminate left breast focal asymmetry with possible distortion.   RECOMMENDATION: Recommend stereotactic biopsy of the left breast asymmetry with possible distortion.   I have discussed the findings and recommendations with the patient. If applicable, a reminder letter will be sent to the patient regarding the next appointment.   BI-RADS CATEGORY  4: Suspicious.     Electronically Signed   By: Gerome Sam III  M.D.   On: 04/18/2022 10:32    Assessment and Plan:  Diagnoses and all orders for this visit:  Invasive ductal carcinoma of breast, left (CMS/HHS-HCC)    I had a lengthy discussion with the patient and her husband about her disease and the treatment options.  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  She is certainly a good candidate for breast conserving therapy.  We discussed lumpectomy and sentinel lymph node biopsy versus mastectomy.  The patient had a lot of questions which were all answered to her satisfaction.  After long discussion, we have made the decision to proceed with left breast radioactive seed localized lumpectomy and left axillary sentinel lymph node biopsy.  The surgical procedure has been discussed with the patient.  Potential risks, benefits, alternative treatments, and expected outcomes have been explained.  All of the patient's questions at this time have been answered.  The likelihood of reaching the patient's treatment goal is good.  The patient understand the proposed surgical procedure and wishes to proceed.  Referrals have been placed to oncology and radiation oncology.  Surgery will likely be followed by adjuvant radiation and probable antiestrogen therapy.    Bradie Lacock Delbert Harness, MD  05/13/2022 2:25 PM

## 2022-05-13 NOTE — H&P (View-Only) (Signed)
Subjective   Chief Complaint: New Consultation and Breast Cancer     History of Present Illness: Isabel [name] is a 61 y.o. female who is seen today as an office consultation at the request of Dr. Horvath for evaluation of New Consultation and Breast Cancer .    This is a 61-year-old female with no family history of breast cancer first-degree relatives who presents after recent diagnosis of breast cancer.  The patient is in relatively good health.  She is prediabetic but is on no medication for diabetes.  She was having noted breast problems when she recently underwent routine screening mammogram.  This showed an area of asymmetry in the upper inner left breast.  Ultrasound showed no sonographic correlate.  She underwent stereotactic biopsy of this area. 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  The patient is now referred for surgical evaluation.  She is accompanied by her husband.  The patient is in relatively good health and exercises regularly.  Review of Systems: A complete review of systems was obtained from the patient.  I have reviewed this information and discussed as appropriate with the patient.  See HPI as well for other ROS.  Review of Systems  Constitutional: Negative.   HENT: Negative.    Eyes: Negative.   Respiratory: Negative.    Cardiovascular: Negative.   Gastrointestinal: Negative.   Genitourinary: Negative.   Musculoskeletal: Negative.   Skin: Negative.   Neurological: Negative.   Endo/Heme/Allergies: Negative.   Psychiatric/Behavioral: Negative.        Medical History: Past Medical History:  Diagnosis Date   GERD (gastroesophageal reflux disease)    History of cancer    Hyperlipidemia     Patient Active Problem List  Diagnosis   Invasive ductal carcinoma of breast, left (CMS/HHS-HCC)   Obesity (BMI 35.0-39.9 without comorbidity), unspecified   Pre-diabetes    Past Surgical  History:  Procedure Laterality Date   CESAREAN SECTION     CHOLECYSTECTOMY       No Known Allergies  Current Outpatient Medications on File Prior to Visit  Medication Sig Dispense Refill   biotin 5 mg capsule Take by mouth     calcium carbonate-vitamin D3 (OS-CAL 500+D) 500 mg-5 mcg (200 unit) tablet Take 1 tablet by mouth once daily     omega-3 fatty acids/fish oil (FISH OIL) 340-1,000 mg capsule Take by mouth     No current facility-administered medications on file prior to visit.    History reviewed. No pertinent family history.   Social History   Tobacco Use  Smoking Status Never  Smokeless Tobacco Never     Social History   Socioeconomic History   Marital status: Married  Tobacco Use   Smoking status: Never   Smokeless tobacco: Never  Substance and Sexual Activity   Alcohol use: Not Currently   Drug use: Never    Objective:    Vitals:   05/13/22 1131  BP: (!) 142/84  Pulse: 98  Temp: 36.7 C (98 F)  SpO2: 98%  Weight: (!) 102.1 kg (225 lb)  Height: 167.6 cm (5' 6")    Body mass index is 36.32 kg/m.  Physical Exam   Constitutional:  WDWN in NAD, conversant, no obvious deformities; lying in bed comfortably Eyes:  Pupils equal, round; sclera anicteric; moist conjunctiva; no lid lag HENT:  Oral mucosa moist; good dentition  Neck:  No masses palpated, trachea midline; no thyromegaly Lungs:  CTA bilaterally; normal respiratory   effort Breasts:  symmetric, no nipple changes; no palpable masses or lymphadenopathy on either side.  Slight ecchymosis in the upper inner left breast. CV:  Regular rate and rhythm; no murmurs; extremities well-perfused with no edema Abd:  +bowel sounds, soft, non-tender, no palpable organomegaly; no palpable hernias Musc: Normal gait; no apparent clubbing or cyanosis in extremities Lymphatic:  No palpable cervical or axillary lymphadenopathy Skin:  Warm, dry; no sign of jaundice Psychiatric - alert and oriented x 4; calm mood  and affect   Labs, Imaging and Diagnostic Testing: Diagnosis Breast, left, needle core biopsy, upper inner, venus clip - INVASIVE DUCTAL CARCINOMA, SEE NOTE - DUCTAL CARCINOMA IN SITU, INTERMEDIATE TO HIGH GRADE - TUBULE FORMATION: SCORE 3 - NUCLEAR PLEOMORPHISM: SCORE 2 - MITOTIC COUNT: SCORE 2 - TOTAL SCORE: 7 - OVERALL GRADE: 2 - LYMPHOVASCULAR INVASION: NOT IDENTIFIED - CANCER LENGTH: 0.7 CM - CALCIFICATIONS: NOT IDENTIFIED - OTHER FINDINGS: NONE Diagnosis Note Dr. LeGolvan reviewed the case and concurs with the above diagnosis. A breast prognostic profile (ER, PR, Ki-67 and HER2) is pending and will be reported in an addendum. [facility] was notified on 05/01/2022. Nilesh Kashikar MD Pathologist, Electronic Signature (Case signed 05/01/2022) Specimen Gross and Clinical Information Specimen Comment TIF: 810 am CIT: < 10 min, screening detected abnormality, asymmetry Specimen(s) Obtained: Breast, left, needle core biopsy, upper inner, venus clip 1 of 3 FINAL for [name], Isabel M ([id number]) Specimen Clinical Information FCC, PASH, R/O malignancy Gross Received in formalin, time in formalin 8:10 a.m. and CIT less than 10 minutes, are fragments and cores of soft tan yellow tissue measuring 2 x 2 x 0.6 cm in aggregate. The specimen is entirely submitted in two cassettes. (GRP:kh 04/30/22) Stain(s) used in Diagnosis: The following stain(s) were used in diagnosing the case: Her2 FISH. The control(s) stained appropriately. ADDITIONAL INFORMATION: 1B- Breast, left, needle core biopsy FLUORESCENCE IN-SITU HYBRIDIZATION Results: GROUP 5: HER2 **NEGATIVE** Equivocal form of amplification of the HER2 gene was detected in the IHC 2+ tissue sample received from this individual. HER2 FISH was performed by a technologist and cell imaging and analysis on the BioView. RATIO OF HER2/CEN17 SIGNALS 1.29 AVERAGE HER2 COPY NUMBER PER CELL 3.10 The ratio of  HER2/CEN 17 is within the range < 2.0 of HER2/CEN 17 and a copy number of HER2 signals per cell is <4.0. Arch Pathol Lab Med 1:1,2018 Fred Picklesimer MD Pathologist, Electronic Signature ( Signed 05/03/2022) Breast, left, needle core biopsy, upper inner, venus clip PROGNOSTIC INDICATORS Results: IMMUNOHISTOCHEMICAL AND MORPHOMETRIC ANALYSIS PERFORMED MANUALLY The tumor cells are EQUIVOCAL for Her2 (2+). Her2 by FISH will be performed and the results reported separately. Estrogen Receptor: 100%, POSITIVE, STRONG STAINING INTENSITY Progesterone Receptor: 0%, NEGATIVE Proliferation Marker Ki67: 10% COMMENT: The negative hormone receptor study(ies) in this case has an internal positive control.  CLINICAL DATA:  The patient was called back for left breast asymmetry   EXAM: DIGITAL DIAGNOSTIC UNILATERAL LEFT MAMMOGRAM WITH TOMOSYNTHESIS; ULTRASOUND LEFT BREAST LIMITED   TECHNIQUE: Left digital diagnostic mammography and breast tomosynthesis was performed.; Targeted ultrasound examination of the left breast was performed.   COMPARISON:  Previous exam(s).   ACR Breast Density Category b: There are scattered areas of fibroglandular density.   FINDINGS: The left breast asymmetry with possible associated distortion is identified in the medial central left breast at a mid depth.   Targeted ultrasound is performed, showing no definitive sonographic correlate for the left breast focal asymmetry with possible distortion. No axillary adenopathy.     IMPRESSION: Indeterminate left breast focal asymmetry with possible distortion.   RECOMMENDATION: Recommend stereotactic biopsy of the left breast asymmetry with possible distortion.   I have discussed the findings and recommendations with the patient. If applicable, a reminder letter will be sent to the patient regarding the next appointment.   BI-RADS CATEGORY  4: Suspicious.     Electronically Signed   By: David  Williams III  M.D.   On: 04/18/2022 10:32    Assessment and Plan:  Diagnoses and all orders for this visit:  Invasive ductal carcinoma of breast, left (CMS/HHS-HCC)    I had a lengthy discussion with the patient and her husband about her disease and the treatment options.  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  She is certainly a good candidate for breast conserving therapy.  We discussed lumpectomy and sentinel lymph node biopsy versus mastectomy.  The patient had a lot of questions which were all answered to her satisfaction.  After long discussion, we have made the decision to proceed with left breast radioactive seed localized lumpectomy and left axillary sentinel lymph node biopsy.  The surgical procedure has been discussed with the patient.  Potential risks, benefits, alternative treatments, and expected outcomes have been explained.  All of the patient's questions at this time have been answered.  The likelihood of reaching the patient's treatment goal is good.  The patient understand the proposed surgical procedure and wishes to proceed.  Referrals have been placed to oncology and radiation oncology.  Surgery will likely be followed by adjuvant radiation and probable antiestrogen therapy.    Dracen Reigle KAI Marlow Hendrie, MD  05/13/2022 2:25 PM 

## 2022-05-14 ENCOUNTER — Encounter: Payer: Self-pay | Admitting: *Deleted

## 2022-05-14 ENCOUNTER — Other Ambulatory Visit: Payer: Self-pay | Admitting: Surgery

## 2022-05-14 DIAGNOSIS — C50912 Malignant neoplasm of unspecified site of left female breast: Secondary | ICD-10-CM

## 2022-05-14 DIAGNOSIS — Z170 Estrogen receptor positive status [ER+]: Secondary | ICD-10-CM

## 2022-05-15 NOTE — Progress Notes (Signed)
New Breast Cancer Diagnosis: Left Breast UIQ  Did patient present with symptoms (if so, please note symptoms) or screening mammography?: Asymmetry was noted on screening mammogram.   Location and Extent of disease :left breast. Located in the upper inner quadrant, measured 0.7 cm in greatest dimension. Adenopathy no.  Histology per Pathology Report: grade 2, Invasive Ductal Carcinoma  Receptor Status: ER(positive), PR (negative), Her2-neu (negative), Ki-(10%)   Surgeon and surgical plan, if any:  Dr. Corliss Skains -Left Breast Lumpectomy with radioactive seed and SLN biopsy 05/28/2022  Medical oncologist, treatment if any:   Dr. Al Pimple 06/04/2022   Family History of Breast/Ovarian/Prostate Cancer: Maternal Aunt had breast cancer, dad had prostate cancer.   Lymphedema issues, if any: No      Pain issues, if any: She reports occasional tenderness to the breast.    SAFETY ISSUES: Prior radiation? No Pacemaker/ICD? No Possible current pregnancy? Postmenopausal Is the patient on methotrexate? No  Current Complaints / other details:

## 2022-05-16 ENCOUNTER — Ambulatory Visit
Admission: RE | Admit: 2022-05-16 | Discharge: 2022-05-16 | Disposition: A | Discharge status: Home / Self Care | Source: Ambulatory Visit | Attending: Radiation Oncology | Admitting: Radiation Oncology

## 2022-05-16 ENCOUNTER — Encounter: Payer: Self-pay | Admitting: Radiation Oncology

## 2022-05-16 VITALS — Ht 66.0 in

## 2022-05-16 DIAGNOSIS — Z170 Estrogen receptor positive status [ER+]: Secondary | ICD-10-CM

## 2022-05-16 NOTE — Progress Notes (Signed)
Radiation Oncology         [phone] ________________________________  Initial Outpatient Consultation - Conducted via telephone at patient request.  I spoke with the patient to conduct this consult visit via telephone. The patient was notified in advance and was offered an in person or telemedicine meeting to allow for face to face communication but instead preferred to proceed with a telephone consult.   Name: Isabel [name]        MRN: [id number]  Date of Service: 05/16/2022 DOB: [birth year]  WU:[id number], Austin Miles, MD  Manus Rudd, MD     REFERRING PHYSICIAN: Manus Rudd, MD   DIAGNOSIS: The encounter diagnosis was Malignant neoplasm of upper-inner quadrant of left breast in female, estrogen receptor positive.   HISTORY OF PRESENT ILLNESS: Isabel [name] is a 62 y.o. female seen at the request of Dr. Corliss Skains for a new diagnosis of left breast cancer. The patient was noted to have screening detected asymmetry in the upper inner quadrant of the left breast. On 04/18/22 an ultrasound was performed and there was no sonographic correlate, and her axilla on the left was negative. 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 Her invasive cancer was ER positive, PR negative, HER2 negative with a Ki 67 of 10%. She's scheduled to undergo left lumpectomy with sentinel node biopsy on 05/28/22, and plans to meet with Dr. Al Pimple on 06/04/22. She's seen to discuss adjuvant therapy.    PREVIOUS RADIATION THERAPY: No   PAST MEDICAL HISTORY:  Past Medical History:  Diagnosis Date   GERD (gastroesophageal reflux disease)    HYPERGLYCEMIA    OBESITY        PAST SURGICAL HISTORY: Past Surgical History:  Procedure Laterality Date   BREAST BIOPSY Left 04/30/2022   MM LT BREAST BX W LOC DEV 1ST LESION IMAGE BX SPEC STEREO GUIDE 04/30/2022 [facility]   CESAREAN SECTION  1992   LAPAROSCOPIC  CHOLECYSTECTOMY  2005     FAMILY HISTORY:  Family History  Problem Relation Age of Onset   Prostate cancer Father    Breast cancer Maternal Aunt 55   Stroke Maternal Grandmother    Hypertension Brother    Kidney disease Brother    Colon cancer Neg Hx    Stomach cancer Neg Hx      SOCIAL HISTORY:  reports that she has never smoked. She has never used smokeless tobacco. She reports current alcohol use of about 2.0 standard drinks of alcohol per week. She reports that she does not use drugs. The patient is married and lives in [place]. She is hoping to go back to work after radiation is over and looking at a part time setting. She has a young 54 year old niece who lives with her.    ALLERGIES: Patient has no known allergies.   MEDICATIONS:  Current Outpatient Medications  Medication Sig Dispense Refill   aspirin EC 81 MG tablet Take 81 mg by mouth daily.     BIOTIN 5000 PO Take 5,000 mcg by mouth.     Calcium Carb-Cholecalciferol (CALCIUM-VITAMIN D) 500-200 MG-UNIT tablet Take 1 tablet by mouth daily.     Multiple Vitamins-Calcium (ONE-A-DAY WOMENS FORMULA) TABS Take by mouth daily. Reported on 02/02/2015     Omega-3 Fatty Acids (FISH OIL) 1000 MG CAPS Take by mouth.      VITAMIN E PO Take 1 tablet by mouth daily.     No  current facility-administered medications for this visit.     REVIEW OF SYSTEMS: On review of systems, the patient reports that she is doing well overall since her biopsy and really motivated to proceed with all her plans for surgery, radiation and antiestrogen therapy. She denies any breast specific complaints.      PHYSICAL EXAM:  Unable to assess due to encounter type    ECOG = 1  0 - Asymptomatic (Fully active, able to carry on all predisease activities without restriction)  1 - Symptomatic but completely ambulatory (Restricted in physically strenuous activity but ambulatory and able to carry out work of a light or sedentary nature. For example,  light housework, office work)  2 - Symptomatic, <50% in bed during the day (Ambulatory and capable of all self care but unable to carry out any work activities. Up and about more than 50% of waking hours)  3 - Symptomatic, >50% in bed, but not bedbound (Capable of only limited self-care, confined to bed or chair 50% or more of waking hours)  4 - Bedbound (Completely disabled. Cannot carry on any self-care. Totally confined to bed or chair)  5 - Death   Santiago Glad MM, Creech RH, Tormey DC, et al. [phone]). "Toxicity and response criteria of the [employer]". Am. Evlyn Clines. Oncol. 5 (6): 649-55    LABORATORY DATA:  Lab Results  Component Value Date   WBC 5.8 04/01/2022   HGB 14.7 04/01/2022   HCT 44.3 04/01/2022   MCV 83.9 04/01/2022   PLT 357.0 04/01/2022   Lab Results  Component Value Date   NA 140 04/01/2022   K 4.8 04/01/2022   CL 104 04/01/2022   CO2 29 04/01/2022   Lab Results  Component Value Date   ALT 42 (H) 04/01/2022   AST 33 04/01/2022   ALKPHOS 55 04/01/2022   BILITOT 0.4 04/01/2022      RADIOGRAPHY: MM LT BREAST BX W LOC DEV 1ST LESION IMAGE BX SPEC STEREO GUIDE  Addendum Date: 05/13/2022   ADDENDUM REPORT: 05/13/2022 08:55 ADDENDUM: Pathology revealed GRADE 2 INVASIVE DUCTAL CARCINOMA- DUCTAL CARCINOMA IN SITU, INTERMEDIATE TO HIGH GRADE of the LEFT breast, upper inner, (venus clip). This was found to be concordant by Dr. Frederico Hamman. Pathology results were discussed with the patient by telephone. The patient reported doing well after the biopsy with tenderness at the site. Post biopsy instructions and care were reviewed and questions were answered. The patient was encouraged to call [facility] for any additional concerns. Surgical consultation has been arranged with Dr. Manus Rudd at [facility] on May 13, 2022. Pathology results reported by Collene Mares RN on 05/03/2022. Electronically Signed   By:  Frederico Hamman M.D.   On: 05/13/2022 08:55   Result Date: 05/13/2022 CLINICAL DATA:  62 year old female presenting for stereotactic biopsy of a left breast asymmetry. EXAM: LEFT BREAST STEREOTACTIC CORE NEEDLE BIOPSY COMPARISON:  Previous exam(s). FINDINGS: The patient and I discussed the procedure of stereotactic-guided biopsy including benefits and alternatives. We discussed the high likelihood of a successful procedure. We discussed the risks of the procedure including infection, bleeding, tissue injury, clip migration, and inadequate sampling. Informed written consent was given. The usual time out protocol was performed immediately prior to the procedure. Using ChloraPrep, sterile technique and 1% Lidocaine with epinephrine as deep local anesthetic, under stereotactic guidance, a 9 gauge vacuum assisted device was used to perform core needle biopsy of an asymmetry in the  upper inner left breast using a superior approach. Lesion quadrant: Upper inner quadrant At the conclusion of the procedure, a venous shaped tissue marker clip was deployed into the biopsy cavity. Follow-up 2-view mammogram was performed and dictated separately. IMPRESSION: Stereotactic-guided biopsy of an asymmetry in the upper inner left breast. No apparent complications. Electronically Signed: By: Frederico HammanMichelle  Collins M.D. On: 04/30/2022 08:19  MM CLIP PLACEMENT LEFT  Result Date: 04/30/2022 CLINICAL DATA:  Post biopsy mammogram of the left breast for clip placement. EXAM: 3D DIAGNOSTIC LEFT MAMMOGRAM POST STEREOTACTIC BIOPSY COMPARISON:  Previous exam(s). FINDINGS: 3D Mammographic images were obtained following stereotactic guided biopsy of an asymmetry in the upper inner left breast. The biopsy marking clip is approximately 1.5 cm inferior to the expected site of biopsy. IMPRESSION: The venous shaped biopsy marking clip is approximately 1.5 cm inferior to the expected site of biopsy. Final Assessment: Post Procedure Mammograms for  Marker Placement Electronically Signed   By: Frederico HammanMichelle  Collins M.D.   On: 04/30/2022 08:24  MM DIAG BREAST TOMO UNI LEFT  Result Date: 04/18/2022 CLINICAL DATA:  The patient was called back for left breast asymmetry EXAM: DIGITAL DIAGNOSTIC UNILATERAL LEFT MAMMOGRAM WITH TOMOSYNTHESIS; ULTRASOUND LEFT BREAST LIMITED TECHNIQUE: Left digital diagnostic mammography and breast tomosynthesis was performed.; Targeted ultrasound examination of the left breast was performed. COMPARISON:  Previous exam(s). ACR Breast Density Category b: There are scattered areas of fibroglandular density. FINDINGS: The left breast asymmetry with possible associated distortion is identified in the medial central left breast at a mid depth. Targeted ultrasound is performed, showing no definitive sonographic correlate for the left breast focal asymmetry with possible distortion. No axillary adenopathy. IMPRESSION: Indeterminate left breast focal asymmetry with possible distortion. RECOMMENDATION: Recommend stereotactic biopsy of the left breast asymmetry with possible distortion. I have discussed the findings and recommendations with the patient. If applicable, a reminder letter will be sent to the patient regarding the next appointment. BI-RADS CATEGORY  4: Suspicious. Electronically Signed   By: Gerome Samavid  Williams III M.D.   On: 04/18/2022 10:32  US BREAST LTD UNI LEFT INC AXILLA  Result Date: 04/18/2022 CLINICAL DATA:  The patient was called back for left breast asymmetry EXAM: DIGITAL DIAGNOSTIC UNILATERAL LEFT MAMMOGRAM WITH TOMOSYNTHESIS; ULTRASOUND LEFT BREAST LIMITED TECHNIQUE: Left digital diagnostic mammography and breast tomosynthesis was performed.; Targeted ultrasound examination of the left breast was performed. COMPARISON:  Previous exam(s). ACR Breast Density Category b: There are scattered areas of fibroglandular density. FINDINGS: The left breast asymmetry with possible associated distortion is identified in the medial  central left breast at a mid depth. Targeted ultrasound is performed, showing no definitive sonographic correlate for the left breast focal asymmetry with possible distortion. No axillary adenopathy. IMPRESSION: Indeterminate left breast focal asymmetry with possible distortion. RECOMMENDATION: Recommend stereotactic biopsy of the left breast asymmetry with possible distortion. I have discussed the findings and recommendations with the patient. If applicable, a reminder letter will be sent to the patient regarding the next appointment. BI-RADS CATEGORY  4: Suspicious. Electronically Signed   By: Gerome Samavid  Williams III M.D.   On: 04/18/2022 10:32      IMPRESSION/PLAN: 1. [id number], grade 2, ER positive, invasive ductal carcinoma of the left breast. Dr. Mitzi HansenMoody discusses the pathology findings and reviews the nature of what appears to be early stage breast disease. The consensus from the breast conference includes breast conservation with lumpectomy with  sentinel node biopsy. Depending on the size of the final tumor measurements rendered by pathology, the tumor may  be tested for Oncotype Dx score to determine a role for systemic therapy. Provided that chemotherapy is not indicated, the patient's course would then be followed by external radiotherapy to the breast  to reduce risks of local recurrence followed by antiestrogen therapy. We discussed the risks, benefits, short, and long term effects of radiotherapy, as well as the curative intent, and the patient is interested in proceeding. Dr. Mitzi Hansen discusses the delivery and logistics of radiotherapy and anticipates a course of 4 or up to 6 1/2 weeks of radiotherapy to the left breast with deep inspiration breath hold technique. We will see her back a few weeks after surgery to discuss the simulation process and anticipate we starting radiotherapy about 4-6 weeks after surgery.  2. Possible genetic predisposition to malignancy. The patient is a candidate for genetic  testing given her personal and family history. She will be referred to genetics as well.     This encounter was conducted via telephone.  The patient has provided two factor identification and has given verbal consent for this type of encounter and has been advised to only accept a meeting of this type in a secure network environment. The time spent during this encounter was 45 minutes including preparation, discussion, and coordination of the patient's care. The attendants for this meeting include Rober Minion, RN, Dr. Mitzi Hansen, Ronny Bacon  and Ricki Rodriguez Oliveri.  During the encounter,  Rober Minion, RN, Dr. Mitzi Hansen, and Ronny Bacon were located at [facility] Radiation Oncology Department.  Isabel [name] was located at home.    The above documentation reflects my direct findings during this shared patient visit. Please see the separate note by Dr. Mitzi Hansen on this date for the remainder of the patient's plan of care.    Osker Mason, [facility]    **Disclaimer: This note was dictated with voice recognition software. Similar sounding words can inadvertently be transcribed and this note may contain transcription errors which may not have been corrected upon publication of note.**

## 2022-05-17 ENCOUNTER — Encounter: Payer: Self-pay | Admitting: Rehabilitation

## 2022-05-17 ENCOUNTER — Ambulatory Visit: Attending: Surgery | Admitting: Rehabilitation

## 2022-05-17 ENCOUNTER — Telehealth: Payer: Self-pay | Admitting: Genetic Counselor

## 2022-05-17 ENCOUNTER — Other Ambulatory Visit: Payer: Self-pay

## 2022-05-17 DIAGNOSIS — R293 Abnormal posture: Secondary | ICD-10-CM | POA: Insufficient documentation

## 2022-05-17 DIAGNOSIS — C50212 Malignant neoplasm of upper-inner quadrant of left female breast: Secondary | ICD-10-CM | POA: Diagnosis present

## 2022-05-17 DIAGNOSIS — Z170 Estrogen receptor positive status [ER+]: Secondary | ICD-10-CM | POA: Diagnosis present

## 2022-05-17 NOTE — Therapy (Signed)
OUTPATIENT PHYSICAL THERAPY BREAST CANCER BASELINE EVALUATION   Patient Name: Isabel [name] MRN: [id number] DOB:[birth year], 62 y.o., female Today's Date: 05/17/2022  END OF SESSION:  PT End of Session - 05/17/22 0848     Visit Number 1    Number of Visits 2    Date for PT Re-Evaluation 07/12/22    Authorization Type NONE NEEDED    PT Start Time 0804    PT Stop Time 0844    PT Time Calculation (min) 40 min    Activity Tolerance Patient tolerated treatment well    Behavior During Therapy [facility] for tasks assessed/performed             Past Medical History:  Diagnosis Date   Breast cancer 05/01/2022   GERD (gastroesophageal reflux disease)    HYPERGLYCEMIA    OBESITY    Past Surgical History:  Procedure Laterality Date   BREAST BIOPSY Left 04/30/2022   MM LT BREAST BX W LOC DEV 1ST LESION IMAGE BX SPEC STEREO GUIDE 04/30/2022 [facility]   CESAREAN SECTION  1992   LAPAROSCOPIC CHOLECYSTECTOMY  2005   Patient Active Problem List   Diagnosis Date Noted   Malignant neoplasm of upper-inner quadrant of left breast in female, estrogen receptor positive 05/13/2022   Pre-diabetes 04/01/2022   Left knee pain 08/21/2017   Routine general medical examination at a health care facility 09/29/2014   Obesity (BMI 35.0-39.9 without comorbidity) 04/18/2009    PCP: Dr. Hillard Danker  REFERRING PROVIDER: Dr. Manus Rudd  REFERRING DIAG: Left breast cancer    THERAPY DIAG:  Malignant neoplasm of upper-inner quadrant of left breast in female, estrogen receptor positive  Abnormal posture  Rationale for Evaluation and Treatment: Rehabilitation  ONSET DATE: 05/13/22  SUBJECTIVE:                                                                                                                                                                                           SUBJECTIVE STATEMENT: Patient reports she is here today to be seen by her medical team for her newly  diagnosed left breast cancer.   PERTINENT HISTORY:  Patient was diagnosed with left grade 2 IDC. It is located in the upper inner quadrant. It is ER pos/PR neg and HER2 neg with a Ki67 of 10%. She will have a lumpectomy and SLNB on 05/28/22.  5.6 PATIENT GOALS:   reduce lymphedema risk and learn post op HEP.   PAIN:  Are you having pain? No  PRECAUTIONS: Active CA   HAND DOMINANCE: right  WEIGHT BEARING RESTRICTIONS: No  FALLS:  Has patient fallen in last 6 months?  No  LIVING ENVIRONMENT: Patient lives with: husband and niece  OCCUPATION: Retired   LEISURE: Elliptical per day, cleaning   PRIOR LEVEL OF FUNCTION: Independent   OBJECTIVE:  COGNITION: Overall cognitive status: Within functional limits for tasks assessed    POSTURE:  Forward head and rounded shoulders posture  UPPER EXTREMITY AROM/PROM:  A/PROM RIGHT   eval   Shoulder extension 50  Shoulder flexion 155  Shoulder abduction 160  Shoulder internal rotation   Shoulder external rotation 85    (Blank rows = not tested)  A/PROM LEFT   eval  Shoulder extension 50  Shoulder flexion 155  Shoulder abduction 170  Shoulder internal rotation   Shoulder external rotation 85    (Blank rows = not tested)  CERVICAL AROM: All within normal limits:   UPPER EXTREMITY STRENGTH: 5/5 strength   LYMPHEDEMA ASSESSMENTS:   LANDMARK RIGHT   eval  10 cm proximal to olecranon process 37  Olecranon process 32  10 cm proximal to ulnar styloid process 16.7  Just proximal to ulnar styloid process 19.5  Across hand at thumb web space 21.2  At base of 2nd digit 8.0  (Blank rows = not tested)  LANDMARK LEFT   eval  10 cm proximal to olecranon process 35.2  Olecranon process 31.8  10 cm proximal to ulnar styloid process 25.3  Just proximal to ulnar styloid process 19.1  Across hand at thumb web space 21  At base of 2nd digit 8.3  (Blank rows = not tested)  L-DEX LYMPHEDEMA SCREENING: The patient was  assessed using the L-Dex machine today to produce a lymphedema index baseline score. The patient will be reassessed on a regular basis (typically every 3 months) to obtain new L-Dex scores. If the score is > 6.5 points away from his/her baseline score indicating onset of subclinical lymphedema, it will be recommended to wear a compression garment for 4 weeks, 12 hours per day and then be reassessed. If the score continues to be > 6.5 points from baseline at reassessment, we will initiate lymphedema treatment. Assessing in this manner has a 95% rate of preventing clinically significant lymphedema.  QUICK DASH SURVEY: 0%  PATIENT EDUCATION:  Education details: Lymphedema risk reduction and post op shoulder/posture HEP Person educated: Patient Education method: Explanation, Demonstration, Handout Education comprehension: Patient verbalized understanding and returned demonstration  HOME EXERCISE PROGRAM: Patient was instructed today in a home exercise program today for post op shoulder range of motion. These included active assist shoulder flexion in sitting, scapular retraction, wall walking with shoulder abduction, and hands behind head external rotation.  She was encouraged to do these twice a day, holding 3 seconds and repeating 5 times when permitted by her physician.   ASSESSMENT:  CLINICAL IMPRESSION: Pt will benefit from a post op PT reassessment to determine needs and from L-Dex screens every 3 months for 2 years to detect subclinical lymphedema.  Pt will benefit from skilled therapeutic intervention to improve on the following deficits: Decreased knowledge of precautions, impaired UE functional use, pain, decreased ROM, postural dysfunction.   PT treatment/interventions: ADL/self-care home management, pt/family education, therapeutic exercise  REHAB POTENTIAL: Excellent  CLINICAL DECISION MAKING: Stable/uncomplicated  EVALUATION COMPLEXITY: Low   GOALS: Goals reviewed with  patient? YES  LONG TERM GOALS: (STG=LTG)    Name Target Date Goal status  1 Pt will be able to verbalize understanding of pertinent lymphedema risk reduction practices relevant to her dx specifically related to skin care.  Baseline:  No  knowledge 05/17/2022 Achieved at eval  2 Pt will be able to return demo and/or verbalize understanding of the post op HEP related to regaining shoulder ROM. Baseline:  No knowledge 05/17/2022 Achieved at eval  3 Pt will be able to verbalize understanding of the importance of attending the post op After Breast CA Class for further lymphedema risk reduction education and therapeutic exercise.  Baseline:  No knowledge 05/17/2022 Achieved at eval  4 Pt will demo she has regained full shoulder ROM and function post operatively compared to baselines.  Baseline: See objective measurements taken today. 07/12/22     PLAN:  PT FREQUENCY/DURATION: EVAL and 1 follow up appointment.   PLAN FOR NEXT SESSION: will reassess 3-4 weeks post op to determine needs.   Patient will follow up at outpatient cancer rehab 3-4 weeks following surgery.  If the patient requires physical therapy at that time, a specific plan will be dictated and sent to the referring physician for approval. The patient was educated today on appropriate basic range of motion exercises to begin post operatively and the importance of attending the After Breast Cancer class following surgery.  Patient was educated today on lymphedema risk reduction practices as it pertains to recommendations that will benefit the patient immediately following surgery.  She verbalized good understanding.    Physical Therapy Information for After Breast Cancer Surgery/Treatment:  Lymphedema is a swelling condition that you may be at risk for in your arm if you have lymph nodes removed from the armpit area.  After a sentinel node biopsy, the risk is approximately 5-9% and is higher after an axillary node dissection.  There is  treatment available for this condition and it is not life-threatening.  Contact your physician or physical therapist with concerns. You may begin the 4 shoulder/posture exercises (see additional sheet) when permitted by your physician (typically a week after surgery).  If you have drains, you may need to wait until those are removed before beginning range of motion exercises.  A general recommendation is to not lift your arms above shoulder height until drains are removed.  These exercises should be done to your tolerance and gently.  This is not a "no pain/no gain" type of recovery so listen to your body and stretch into the range of motion that you can tolerate, stopping if you have pain.  If you are having immediate reconstruction, ask your plastic surgeon about doing exercises as he or she may want you to wait. We encourage you to attend the free one time ABC (After Breast Cancer) class offered by [facility].  You will learn information related to lymphedema risk, prevention and treatment and additional exercises to regain mobility following surgery.  You can call [phone] for more information.  This is offered the 1st and 3rd Monday of each month.  You only attend the class one time. While undergoing any medical procedure or treatment, try to avoid blood pressure being taken or needle sticks from occurring on the arm on the side of cancer.   This recommendation begins after surgery and continues for the rest of your life.  This may help reduce your risk of getting lymphedema (swelling in your arm). An excellent resource for those seeking information on lymphedema is the National Lymphedema Network's web site. It can be accessed at www.lymphnet.org If you notice swelling in your hand, arm or breast at any time following surgery (even if it is many years from now), please contact your doctor or  physical therapist to discuss this.  Lymphedema can be treated at any time but it is  easier for you if it is treated early on.  If you feel like your shoulder motion is not returning to normal in a reasonable amount of time, please contact your surgeon or physical therapist.  [facility] [phone]. [address], [place] [address]  ABC CLASS After Breast Cancer Class  After Breast Cancer Class is a specially designed exercise class to assist you in a safe recover after having breast cancer surgery.  In this class you will learn how to get back to full function whether your drains were just removed or if you had surgery a month ago.  This one-time class is held the 1st and 3rd Monday of every month from 11:00 a.m. until 12:00 noon virtually.  This class is FREE and space is limited. For more information or to register for the next available class, call [phone].  Class Goals  Understand specific stretches to improve the flexibility of you chest and shoulder. Learn ways to safely strengthen your upper body and improve your posture. Understand the warning signs of infection and why you may be at risk for an arm infection. Learn about Lymphedema and prevention.  ** You do not attend this class until after surgery.  Drains must be removed to participate  Patient was instructed today in a home exercise program today for post op shoulder range of motion. These included active assist shoulder flexion in sitting, scapular retraction, wall walking with shoulder abduction, and hands behind head external rotation.  She was encouraged to do these twice a day, holding 3 seconds and repeating 5 times when permitted by her physician.    [name], PT 05/17/2022, 8:49 AM

## 2022-05-17 NOTE — Telephone Encounter (Signed)
Reached out to patient to schedule per 4/11 IB, left voicemail.

## 2022-05-21 ENCOUNTER — Encounter: Admitting: Genetic Counselor

## 2022-05-21 ENCOUNTER — Other Ambulatory Visit

## 2022-05-22 ENCOUNTER — Other Ambulatory Visit: Payer: Self-pay

## 2022-05-22 ENCOUNTER — Inpatient Hospital Stay

## 2022-05-22 ENCOUNTER — Inpatient Hospital Stay: Attending: Hematology and Oncology | Admitting: Hematology and Oncology

## 2022-05-22 VITALS — BP 152/63 | HR 92 | Temp 97.7°F | Resp 16 | Ht 66.0 in | Wt 228.9 lb

## 2022-05-22 DIAGNOSIS — C50212 Malignant neoplasm of upper-inner quadrant of left female breast: Secondary | ICD-10-CM | POA: Diagnosis present

## 2022-05-22 DIAGNOSIS — Z803 Family history of malignant neoplasm of breast: Secondary | ICD-10-CM | POA: Insufficient documentation

## 2022-05-22 DIAGNOSIS — Z170 Estrogen receptor positive status [ER+]: Secondary | ICD-10-CM | POA: Insufficient documentation

## 2022-05-22 DIAGNOSIS — Z8042 Family history of malignant neoplasm of prostate: Secondary | ICD-10-CM | POA: Diagnosis not present

## 2022-05-22 NOTE — Pre-Procedure Instructions (Signed)
Surgical Instructions    Your procedure is scheduled on May 28, 2022.  Report to [facility] Main Entrance "A" at 10:15 A.M., then check in with the Admitting office.  Call this number if you have problems the morning of surgery:  [phone]  If you have any questions prior to your surgery date call [phone]: Open Monday-Friday 8am-4pm If you experience any cold or flu symptoms such as cough, fever, chills, shortness of breath, etc. between now and your scheduled surgery, please notify us at the above number.     Remember:  Do not eat after midnight the night before your surgery  You may drink clear liquids until 9:15 AM the morning of your surgery.   Clear liquids allowed are: Water, Non-Citrus Juices (without pulp), Carbonated Beverages, Clear Tea, Black Coffee Only (NO MILK, CREAM OR POWDERED CREAMER of any kind), and Gatorade.  Patient Instructions  The night before surgery:  No food after midnight. ONLY clear liquids after midnight  The day of surgery (if you do NOT have diabetes):  Drink ONE (1) Pre-Surgery Clear Ensure by 9:15 AM the morning of surgery. Drink in one sitting. Do not sip.  This drink was given to you during your hospital  pre-op appointment visit.  Nothing else to drink after completing the  Pre-Surgery Clear Ensure.         If you have questions, please contact your surgeon's office.     Take these medicines the morning of surgery with A SIP OF WATER:  NONE   Follow your surgeon's instructions on when to stop Aspirin.  If no instructions were given by your surgeon then you will need to call the office to get those instructions.     As of today, STOP taking any Aleve, Naproxen, Ibuprofen, Motrin, Advil, Goody's, BC's, all herbal medications, fish oil, and all vitamins.                     Do NOT Smoke (Tobacco/Vaping) for 24 hours prior to your procedure.  If you use a CPAP at night, you may bring your mask/headgear for your overnight stay.    Contacts, glasses, piercing's, hearing aid's, dentures or partials may not be worn into surgery, please bring cases for these belongings.    For patients admitted to the hospital, discharge time will be determined by your treatment team.   Patients discharged the day of surgery will not be allowed to drive home, and someone needs to stay with them for 24 hours.  SURGICAL WAITING ROOM VISITATION Patients having surgery or a procedure may have no more than 2 support people in the waiting area - these visitors may rotate.   Children under the age of 45 must have an adult with them who is not the patient. If the patient needs to stay at the hospital during part of their recovery, the visitor guidelines for inpatient rooms apply. Pre-op nurse will coordinate an appropriate time for 1 support person to accompany patient in pre-op.  This support person may not rotate.   Please refer to the [facility] website for the visitor guidelines for Inpatients (after your surgery is over and you are in a regular room).    Special instructions:   [facility]- Preparing For Surgery  Before surgery, you can play an important role. Because skin is not sterile, your skin needs to be as free of germs as possible. You can reduce the number of germs on your skin by washing with CHG (  chlorahexidine gluconate) Soap before surgery.  CHG is an antiseptic cleaner which kills germs and bonds with the skin to continue killing germs even after washing.    Oral Hygiene is also important to reduce your risk of infection.  Remember - BRUSH YOUR TEETH THE MORNING OF SURGERY WITH YOUR REGULAR TOOTHPASTE  Please do not use if you have an allergy to CHG or antibacterial soaps. If your skin becomes reddened/irritated stop using the CHG.  Do not shave (including legs and underarms) for at least 48 hours prior to first CHG shower. It is OK to shave your face.  Please follow these instructions carefully.   Shower the NIGHT BEFORE  SURGERY and the MORNING OF SURGERY  If you chose to wash your hair, wash your hair first as usual with your normal shampoo.  After you shampoo, rinse your hair and body thoroughly to remove the shampoo.  Use CHG Soap as you would any other liquid soap. You can apply CHG directly to the skin and wash gently with a scrungie or a clean washcloth.   Apply the CHG Soap to your body ONLY FROM THE NECK DOWN.  Do not use on open wounds or open sores. Avoid contact with your eyes, ears, mouth and genitals (private parts). Wash Face and genitals (private parts)  with your normal soap.   Wash thoroughly, paying special attention to the area where your surgery will be performed.  Thoroughly rinse your body with warm water from the neck down.  DO NOT shower/wash with your normal soap after using and rinsing off the CHG Soap.  Pat yourself dry with a CLEAN TOWEL.  Wear CLEAN PAJAMAS to bed the night before surgery  Place CLEAN SHEETS on your bed the night before your surgery  DO NOT SLEEP WITH PETS.   Day of Surgery: Take a shower with CHG soap. Do not wear jewelry or makeup Do not wear lotions, powders, perfumes/colognes, or deodorant. Do not shave 48 hours prior to surgery.  Men may shave face and neck. Do not bring valuables to the hospital.  [facility] is not responsible for any belongings or valuables. Do not wear nail polish, gel polish, artificial nails, or any other type of covering on natural nails (fingers and toes) If you have artificial nails or gel coating that need to be removed by a nail salon, please have this removed prior to surgery. Artificial nails or gel coating may interfere with anesthesia's ability to adequately monitor your vital signs.  Wear Clean/Comfortable clothing the morning of surgery Remember to brush your teeth WITH YOUR REGULAR TOOTHPASTE.   Please read over the following fact sheets that you were given.    If you received a COVID test during your  pre-op visit  it is requested that you wear a mask when out in public, stay away from anyone that may not be feeling well and notify your surgeon if you develop symptoms. If you have been in contact with anyone that has tested positive in the last 10 days please notify you surgeon.

## 2022-05-22 NOTE — Assessment & Plan Note (Signed)
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  She is currently scheduled for surgery on 423.  The exact size of the tumor remains undetermined although on the biopsy it measured approximately 7 mm.  She will proceed with surgery upfront followed by Oncotype Dx testing and I have discussed the following details about Oncotype.  We have discussed about Oncotype Dx score which is a well validated prognostic scoring system which can predict outcome with endocrine therapy alone and whether chemotherapy reduces recurrence.  Typically in patients with ER positive cancers that are node negative if the RS score is high typically greater than or equal to 26, chemotherapy is recommended.  In women with intermediate recurrence score younger than 50, there can still be some role for chemotherapy in addition to endocrine therapy especially if the recurrence score is between 21-25. If chemotherapy is needed, this will precede radiation and then after radiation she will continue on antiestrogen therapy.  Following Oncotype DX testing if she is not a candidate for adjuvant chemotherapy, she may proceed with adjuvant radiation followed by antiestrogen therapy with tamoxifen or aromatase inhibitors.  We have discussed options for antiestrogen therapy today  With regards to Tamoxifen, we discussed that this is a SERM, selective estrogen receptor modulator. We discussed mechanism of action of Tamoxifen, adverse effects on Tamoxifen including but not limited to post menopausal symptoms, increased risk of DVT/PE, increased risk of endometrial cancer, questionable cataracts with long term use and increased risk of cardiovascular events in the study which was not  statistically significant. A benefit from Tamoxifen would be improvement in bone density. With regards to aromatase inhibitors, we discussed mechanism of action, adverse effects including but not limited to post menopausal symptoms, arthralgias, myalgias, increased risk of cardiovascular events and bone loss.   She will return to clinic after surgery to review Oncotype DX results and any additional recommendations.

## 2022-05-22 NOTE — Progress Notes (Signed)
[facility] CONSULT NOTE  Patient Care Team: [name], MD as PCP - General (Internal Medicine) Carrington Clamp, MD (Obstetrics and Gynecology) Pyrtle, Carie Caddy, MD (Gastroenterology) Pershing Proud, RN as Oncology Nurse Navigator Donnelly Angelica, RN as Oncology Nurse Navigator  CHIEF COMPLAINTS/PURPOSE OF CONSULTATION:  Newly diagnosed breast cancer  HISTORY OF PRESENTING ILLNESS:  Isabel [name] 62 y.o. female is here because of recent diagnosis of left   I reviewed her records extensively and collaborated the history with the patient.  SUMMARY OF ONCOLOGIC HISTORY: Oncology History  Malignant neoplasm of upper-inner quadrant of left breast in female, estrogen receptor positive  04/02/2022 Mammogram   In the left breast a possible asymmetry warrants further evaluation.  In the right breast, no findings suspicious for malignancy.  Further evaluation is suggested for possible asymmetry in the left breast.  Ultrasound breast showed indeterminate left breast focal asymmetry with possible distortion.   05/13/2022 Initial Diagnosis   Malignant neoplasm of upper-inner quadrant of left breast in female, estrogen receptor positive    Pathology Results   Left breast needle core biopsy showed invasive ductal carcinoma overall grade 2, DCIS intermediate to high-grade, prognostic showed ER 100% positive strong staining PR 0% negative, Ki-67 of 10% and group 5 HER2 negative    Patient is scheduled for surgery on 4/23 and is here for recommendations.  She is healthy at baseline, works as a Airline pilot.  She reports some family history of breast cancer in maternal aunt.  She has 1 child who is 49 years old, has a grandchild.  She denies any major health issues.  She has been working to lose weight, lost about 20 pounds since her diet modification.  She eats healthy.  Rest of the pertinent 10 point ROS reviewed and negative.  MEDICAL HISTORY:  Past  Medical History:  Diagnosis Date   Breast cancer 05/01/2022   GERD (gastroesophageal reflux disease)    HYPERGLYCEMIA    OBESITY     SURGICAL HISTORY: Past Surgical History:  Procedure Laterality Date   BREAST BIOPSY Left 04/30/2022   MM LT BREAST BX W LOC DEV 1ST LESION IMAGE BX SPEC STEREO GUIDE 04/30/2022 [facility] MAMMOGR[facility]   CESAREAN SECTION  1992   LAPAROSCOPIC CHOLECYSTECTOMY  2005    SOCIAL HISTORY: Social History   Socioeconomic History   Marital status: Married    Spouse name: Not on file   Number of children: Not on file   Years of education: Not on file   Highest education level: Not on file  Occupational History   Not on file  Tobacco Use   Smoking status: Never   Smokeless tobacco: Never   Tobacco comments:    Married, 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  Substance and Sexual Activity   Alcohol use: Yes    Alcohol/week: 2.0 standard drinks of alcohol    Types: 2 Glasses of wine per week   Drug use: No   Sexual activity: Not on file  Other Topics Concern   Not on file  Social History Narrative   Not on file   Social Determinants of Health   Financial Resource Strain: Not on file  Food Insecurity: Not on file  Transportation Needs: Not on file  Physical Activity: Not on file  Stress: Not on file  Social Connections: Not on file  Intimate Partner Violence: Not on file    FAMILY HISTORY: Family History  Problem Relation Age of  Onset   Prostate cancer Father    Breast cancer Maternal Aunt 55   Stroke Maternal Grandmother    Hypertension Brother    Kidney disease Brother    Colon cancer Neg Hx    Stomach cancer Neg Hx     ALLERGIES:  has No Known Allergies.  MEDICATIONS:  Current Outpatient Medications  Medication Sig Dispense Refill   aspirin EC 81 MG tablet Take 81 mg by mouth daily.     Calcium Carb-Cholecalciferol (CALCIUM-VITAMIN D) 500-200 MG-UNIT tablet Take 1 tablet by mouth daily.     Multiple  Vitamins-Calcium (ONE-A-DAY WOMENS FORMULA) TABS Take 1 tablet by mouth daily.     Omega-3 Fatty Acids (FISH OIL) 1000 MG CAPS Take 1,000 mg by mouth daily.     VITAMIN E PO Take 1 capsule by mouth daily.     No current facility-administered medications for this visit.    REVIEW OF SYSTEMS:   Constitutional: Denies fevers, chills or abnormal night sweats Eyes: Denies blurriness of vision, double vision or watery eyes Ears, nose, mouth, throat, and face: Denies mucositis or sore throat Respiratory: Denies cough, dyspnea or wheezes Cardiovascular: Denies palpitation, chest discomfort or lower extremity swelling Gastrointestinal:  Denies nausea, heartburn or change in bowel habits Skin: Denies abnormal skin rashes Lymphatics: Denies new lymphadenopathy or easy bruising Neurological:Denies numbness, tingling or new weaknesses Behavioral/Psych: Mood is stable, no new changes  Breast: Denies any palpable lumps or discharge All other systems were reviewed with the patient and are negative.  PHYSICAL EXAMINATION: ECOG PERFORMANCE STATUS: 0 - Asymptomatic  Vitals:   05/22/22 1059  BP: (!) 152/63  Pulse: 92  Resp: 16  Temp: 97.7 F (36.5 C)  SpO2: 100%   Filed Weights   05/22/22 1059  Weight: 228 lb 14.4 oz (103.8 kg)    GENERAL:alert, no distress and comfortable SKIN: skin color, texture, turgor are normal, no rashes or significant lesions EYES: normal, conjunctiva are pink and non-injected, sclera clear OROPHARYNX:no exudate, no erythema and lips, buccal mucosa, and tongue normal  NECK: supple, thyroid normal size, non-tender, without nodularity LYMPH:  no palpable lymphadenopathy in the cervical, axillary LUNGS: clear to auscultation and percussion with normal breathing effort HEART: regular rate & rhythm and no murmurs and no lower extremity edema ABDOMEN:abdomen soft, non-tender and normal bowel sounds Musculoskeletal:no cyanosis of digits and no clubbing  PSYCH: alert &  oriented x 3 with fluent speech NEURO: no focal motor/sensory deficits BREAST: Postbiopsy changes.  No obvious palpable breast masses in bilateral breast.  No regional adenopathy  LABORATORY DATA:  I have reviewed the data as listed Lab Results  Component Value Date   WBC 5.8 04/01/2022   HGB 14.7 04/01/2022   HCT 44.3 04/01/2022   MCV 83.9 04/01/2022   PLT 357.0 04/01/2022   Lab Results  Component Value Date   NA 140 04/01/2022   K 4.8 04/01/2022   CL 104 04/01/2022   CO2 29 04/01/2022    RADIOGRAPHIC STUDIES: I have personally reviewed the radiological reports and agreed with the findings in the report.  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  She is currently scheduled for surgery on  423.  The exact size of the tumor remains undetermined although on the biopsy it measured approximately 7 mm.  She will proceed with surgery upfront followed by Oncotype Dx testing and I have discussed the following details about Oncotype.  We have discussed about Oncotype Dx score which is a well validated prognostic scoring system which can predict outcome with endocrine therapy alone and whether chemotherapy reduces recurrence.  Typically in patients with ER positive cancers that are node negative if the RS score is high typically greater than or equal to 26, chemotherapy is recommended.  In women with intermediate recurrence score younger than 50, there can still be some role for chemotherapy in addition to endocrine therapy especially if the recurrence score is between 21-25. If chemotherapy is needed, this will precede  radiation and then after radiation she will continue on antiestrogen therapy.  Following Oncotype DX testing if she is not a candidate for adjuvant chemotherapy, she may proceed with adjuvant radiation followed by antiestrogen therapy with tamoxifen or aromatase inhibitors.  We have discussed options for antiestrogen therapy today  With regards to Tamoxifen, we discussed that this is a SERM, selective estrogen receptor modulator. We discussed mechanism of action of Tamoxifen, adverse effects on Tamoxifen including but not limited to post menopausal symptoms, increased risk of DVT/PE, increased risk of endometrial cancer, questionable cataracts with long term use and increased risk of cardiovascular events in the study which was not statistically significant. A benefit from Tamoxifen would be improvement in bone density. With regards to aromatase inhibitors, we discussed mechanism of action, adverse effects including but not limited to post menopausal symptoms, arthralgias, myalgias, increased risk of cardiovascular events and bone loss.   She will return to clinic after surgery to review Oncotype DX results and any additional recommendations.      All questions were answered. The patient knows to call the clinic with any problems, questions or concerns.    Rachel Moulds, MD 05/22/22

## 2022-05-23 ENCOUNTER — Other Ambulatory Visit: Payer: Self-pay

## 2022-05-23 ENCOUNTER — Encounter (HOSPITAL_COMMUNITY)
Admission: RE | Admit: 2022-05-23 | Discharge: 2022-05-23 | Disposition: A | Discharge status: Home / Self Care | Source: Ambulatory Visit | Attending: Surgery | Admitting: Surgery

## 2022-05-23 ENCOUNTER — Telehealth: Payer: Self-pay | Admitting: Genetic Counselor

## 2022-05-23 ENCOUNTER — Encounter (HOSPITAL_COMMUNITY): Payer: Self-pay

## 2022-05-23 VITALS — BP 147/74 | HR 87 | Temp 98.2°F | Resp 17 | Ht 66.0 in | Wt 227.6 lb

## 2022-05-23 DIAGNOSIS — Z01812 Encounter for preprocedural laboratory examination: Secondary | ICD-10-CM | POA: Insufficient documentation

## 2022-05-23 DIAGNOSIS — C50912 Malignant neoplasm of unspecified site of left female breast: Secondary | ICD-10-CM | POA: Diagnosis not present

## 2022-05-23 DIAGNOSIS — K219 Gastro-esophageal reflux disease without esophagitis: Secondary | ICD-10-CM | POA: Diagnosis not present

## 2022-05-23 DIAGNOSIS — Z01818 Encounter for other preprocedural examination: Secondary | ICD-10-CM

## 2022-05-23 DIAGNOSIS — R7303 Prediabetes: Secondary | ICD-10-CM | POA: Diagnosis not present

## 2022-05-23 DIAGNOSIS — Z780 Asymptomatic menopausal state: Secondary | ICD-10-CM | POA: Diagnosis not present

## 2022-05-23 HISTORY — DX: Prediabetes: R73.03

## 2022-05-23 HISTORY — DX: Pneumonia, unspecified organism: J18.9

## 2022-05-23 LAB — CBC
HCT: 41.6 % (ref 36.0–46.0)
Hemoglobin: 13.8 g/dL (ref 12.0–15.0)
MCH: 27.9 pg (ref 26.0–34.0)
MCHC: 33.2 g/dL (ref 30.0–36.0)
MCV: 84.2 fL (ref 80.0–100.0)
Platelets: 313 10*3/uL (ref 150–400)
RBC: 4.94 MIL/uL (ref 3.87–5.11)
RDW: 13.8 % (ref 11.5–15.5)
WBC: 5.3 10*3/uL (ref 4.0–10.5)
nRBC: 0 % (ref 0.0–0.2)

## 2022-05-23 NOTE — Progress Notes (Addendum)
PCP - elizabeth crawford Cardiologist - denies Oncologist: Rachel Moulds  PPM/ICD - denies Device Orders -  Rep Notified -   Chest x-ray - denies EKG - n/a Stress Test - denies ECHO - denies Cardiac Cath - denies  Sleep Study - denies   ERAS Protcol -yes PRE-SURGERY Ensure or G2- ensure ordered and given  COVID TEST- not needed   Anesthesia review: yes, seed placement  Patient denies shortness of breath, fever, cough and chest pain at PAT appointment   All instructions explained to the patient, with a verbal understanding of the material. Patient agrees to go over the instructions while at home for a better understanding. Patient also instructed to self quarantine after being tested for COVID-19. The opportunity to ask questions was provided.

## 2022-05-24 ENCOUNTER — Encounter: Payer: Self-pay | Admitting: *Deleted

## 2022-05-24 ENCOUNTER — Telehealth: Payer: Self-pay | Admitting: Hematology and Oncology

## 2022-05-24 NOTE — Progress Notes (Signed)
Anesthesia Chart Review:   Case: [id number] Date/Time: 05/28/22 1200   Procedure: LEFT BREAST LUMPECTOMY WITH RADIOACTIVE SEED AND AXILLARY SENTINEL LYMPH NODE BIOPSY (Left: Breast)   Anesthesia type: General   Pre-op diagnosis: LEFT BREAST CANCER   Location: [facility] ROOM 02 / [facility]   Surgeons: Manus Rudd, MD       DISCUSSION: Patient is a 62 year old female scheduled for the above procedure.  History includes never smoker, prediabetes, GERD, breast cancer (04/30/22 left breat biopsy: IDC), cholecystectomy (08/27/02).  BMI is consistent with obesity.  She denied shortness of breath, cough, fever, chest pain at PAT RN visit.  Surgeon and oncology notes mention that she exercises regularly and has lost around 20 pounds by diet and exercise.  RSL scheduled for 05/27/22 at 1:00 PM. She is post-menopausal. Anesthesia team to evaluate on the day of surgery.    VS: BP (!) 147/74   Pulse 87   Temp 36.8 C   Resp 17   Ht  (1.676 m)   Wt 103.2 kg   LMP 11/22/2011   SpO2 100%   BMI 36.74 kg/m    PROVIDERS: [name], MD is PCP  Rachel Moulds, MD is HEM-ONC Dorothy Puffer, MD is RAD-ONC   LABS: Labs reviewed: Acceptable for surgery. CMP 04/01/22 showed Creatinine 0.80, glucose 104, AST 33, ALT 42, albumin 4.6, total protein 8.5, A1c 6.2%.  (all labs ordered are listed, but only abnormal results are displayed)  Labs Reviewed  CBC    EKG: N/A. Last EKG 04/03/16 showed NSR.   CV: N/A  Past Medical History:  Diagnosis Date   Breast cancer 05/01/2022   GERD (gastroesophageal reflux disease)    HYPERGLYCEMIA    OBESITY    Pneumonia    Pre-diabetes     Past Surgical History:  Procedure Laterality Date   BREAST BIOPSY Left 04/30/2022   MM LT BREAST BX W LOC DEV 1ST LESION IMAGE BX SPEC STEREO GUIDE 04/30/2022 [facility]MO[facility]   CESAREAN SECTION  1992   LAPAROSCOPIC CHOLECYSTECTOMY  2005    MEDICATIONS:  aspirin EC 81 MG tablet   Calcium Carb-Cholecalciferol  (CALCIUM-VITAMIN D) 500-200 MG-UNIT tablet   Multiple Vitamins-Calcium (ONE-A-DAY WOMENS FORMULA) TABS   Omega-3 Fatty Acids (FISH OIL) 1000 MG CAPS   VITAMIN E PO   No current facility-administered medications for this encounter.  Advised to follow surgeon recommendations regarding perioperative ASA.   Shonna Chock, PA-C Surgical Short Stay/Anesthesiology [facility] Phone [phone] [facility] Phone [phone] 05/24/2022 9:43 AM

## 2022-05-24 NOTE — Telephone Encounter (Signed)
Spoke with patient confirming upcoming appointment  

## 2022-05-24 NOTE — Anesthesia Preprocedure Evaluation (Signed)
Anesthesia Evaluation  Patient identified by MRN, date of birth, ID band Patient awake    Reviewed: Allergy & Precautions, NPO status , Patient's Chart, lab work & pertinent test results  History of Anesthesia Complications Negative for: history of anesthetic complications  Airway Mallampati: II  TM Distance: >3 FB Neck ROM: Full    Dental  (+) Caps, Dental Advisory Given   Pulmonary neg pulmonary ROS   breath sounds clear to auscultation       Cardiovascular negative cardio ROS  Rhythm:Regular Rate:Normal     Neuro/Psych negative neurological ROS     GI/Hepatic Neg liver ROS,GERD  Controlled,,  Endo/Other  BMI 36.7  Renal/GU negative Renal ROS     Musculoskeletal   Abdominal   Peds  Hematology   Anesthesia Other Findings   Reproductive/Obstetrics                              Anesthesia Physical Anesthesia Plan  ASA: 2  Anesthesia Plan: General   Post-op Pain Management: Regional block* and Tylenol PO (pre-op)*   Induction: Intravenous  PONV Risk Score and Plan: 3 and Ondansetron and Dexamethasone  Airway Management Planned: LMA  Additional Equipment: None  Intra-op Plan:   Post-operative Plan:   Informed Consent: I have reviewed the patients History and Physical, chart, labs and discussed the procedure including the risks, benefits and alternatives for the proposed anesthesia with the patient or authorized representative who has indicated his/her understanding and acceptance.     Dental advisory given  Plan Discussed with: CRNA and Surgeon  Anesthesia Plan Comments: (PAT note written 05/24/2022 by Shonna Chock, PA-C. Plan routine monitors, GA with pectoralis blocks for post op analgesia  )        Anesthesia Quick Evaluation

## 2022-05-27 ENCOUNTER — Ambulatory Visit
Admission: RE | Admit: 2022-05-27 | Discharge: 2022-05-27 | Disposition: A | Discharge status: Home / Self Care | Source: Ambulatory Visit | Attending: Surgery | Admitting: Surgery

## 2022-05-27 DIAGNOSIS — C50912 Malignant neoplasm of unspecified site of left female breast: Secondary | ICD-10-CM

## 2022-05-27 HISTORY — PX: BREAST BIOPSY: SHX20

## 2022-05-28 ENCOUNTER — Ambulatory Visit (HOSPITAL_COMMUNITY): Admitting: Vascular Surgery

## 2022-05-28 ENCOUNTER — Other Ambulatory Visit: Payer: Self-pay

## 2022-05-28 ENCOUNTER — Ambulatory Visit (HOSPITAL_BASED_OUTPATIENT_CLINIC_OR_DEPARTMENT_OTHER): Admitting: Anesthesiology

## 2022-05-28 ENCOUNTER — Ambulatory Visit
Admission: RE | Admit: 2022-05-28 | Discharge: 2022-05-28 | Disposition: A | Discharge status: Home / Self Care | Source: Ambulatory Visit | Attending: Surgery | Admitting: Surgery

## 2022-05-28 ENCOUNTER — Encounter (HOSPITAL_COMMUNITY): Payer: Self-pay | Admitting: Surgery

## 2022-05-28 ENCOUNTER — Encounter (HOSPITAL_COMMUNITY)
Admission: RE | Disposition: A | Discharge status: Home / Self Care | Payer: Self-pay | Source: Home / Self Care | Attending: Surgery

## 2022-05-28 ENCOUNTER — Ambulatory Visit (HOSPITAL_COMMUNITY)
Admission: RE | Admit: 2022-05-28 | Discharge: 2022-05-28 | Disposition: A | Discharge status: Home / Self Care | Source: Ambulatory Visit | Attending: Surgery | Admitting: Surgery

## 2022-05-28 ENCOUNTER — Ambulatory Visit (HOSPITAL_COMMUNITY)
Admission: RE | Admit: 2022-05-28 | Discharge: 2022-05-28 | Disposition: A | Discharge status: Home / Self Care | Attending: Surgery | Admitting: Surgery

## 2022-05-28 DIAGNOSIS — C50212 Malignant neoplasm of upper-inner quadrant of left female breast: Secondary | ICD-10-CM | POA: Diagnosis present

## 2022-05-28 DIAGNOSIS — Z170 Estrogen receptor positive status [ER+]: Secondary | ICD-10-CM | POA: Diagnosis not present

## 2022-05-28 DIAGNOSIS — C50912 Malignant neoplasm of unspecified site of left female breast: Secondary | ICD-10-CM

## 2022-05-28 DIAGNOSIS — R7303 Prediabetes: Secondary | ICD-10-CM | POA: Diagnosis not present

## 2022-05-28 HISTORY — PX: AXILLARY SENTINEL NODE BIOPSY: SHX5738

## 2022-05-28 HISTORY — PX: BREAST LUMPECTOMY WITH RADIOACTIVE SEED AND SENTINEL LYMPH NODE BIOPSY: SHX6550

## 2022-05-28 SURGERY — BREAST LUMPECTOMY WITH RADIOACTIVE SEED AND SENTINEL LYMPH NODE BIOPSY
Anesthesia: Regional | Site: Breast | Laterality: Left

## 2022-05-28 MED ORDER — CHLORHEXIDINE GLUCONATE CLOTH 2 % EX PADS: 6.0000 | MEDICATED_PAD | Freq: Once | CUTANEOUS | Status: DC

## 2022-05-28 MED ORDER — PROPOFOL 500 MG/50ML IV EMUL
INTRAVENOUS | Status: DC | PRN
  Administered 2022-05-28: 150 ug/kg/min via INTRAVENOUS

## 2022-05-28 MED ORDER — DEXAMETHASONE SODIUM PHOSPHATE 10 MG/ML IJ SOLN
INTRAMUSCULAR | Status: DC | PRN
  Administered 2022-05-28: 10 mg via INTRAVENOUS

## 2022-05-28 MED ORDER — FENTANYL CITRATE (PF) 100 MCG/2ML IJ SOLN
INTRAMUSCULAR | Status: AC
  Administered 2022-05-28: 100 ug via INTRAVENOUS
  Filled 2022-05-28: qty 2

## 2022-05-28 MED ORDER — TECHNETIUM TC 99M TILMANOCEPT KIT
1.0000 | PACK | Freq: Once | INTRAVENOUS | Status: AC | PRN
  Administered 2022-05-28: 1 via INTRADERMAL

## 2022-05-28 MED ORDER — MIDAZOLAM HCL 2 MG/2ML IJ SOLN
INTRAMUSCULAR | Status: AC
  Administered 2022-05-28: 2 mg via INTRAVENOUS
  Filled 2022-05-28: qty 2

## 2022-05-28 MED ORDER — CEFAZOLIN SODIUM-DEXTROSE 2-4 GM/100ML-% IV SOLN
2.0000 g | INTRAVENOUS | Status: AC
  Administered 2022-05-28: 2 g via INTRAVENOUS
  Filled 2022-05-28: qty 100

## 2022-05-28 MED ORDER — MIDAZOLAM HCL 2 MG/2ML IJ SOLN: 0.5000 mg | Freq: Once | INTRAMUSCULAR | Status: DC | PRN

## 2022-05-28 MED ORDER — OXYCODONE HCL 5 MG PO TABS: 5.0000 mg | ORAL_TABLET | Freq: Once | ORAL | Status: DC | PRN

## 2022-05-28 MED ORDER — FENTANYL CITRATE (PF) 250 MCG/5ML IJ SOLN
INTRAMUSCULAR | Status: DC | PRN
  Administered 2022-05-28 (×4): 50 ug via INTRAVENOUS

## 2022-05-28 MED ORDER — DEXAMETHASONE SODIUM PHOSPHATE 10 MG/ML IJ SOLN
INTRAMUSCULAR | Status: AC
  Filled 2022-05-28: qty 1

## 2022-05-28 MED ORDER — CHLORHEXIDINE GLUCONATE 0.12 % MT SOLN
15.0000 mL | Freq: Once | OROMUCOSAL | Status: AC
  Administered 2022-05-28: 15 mL via OROMUCOSAL
  Filled 2022-05-28: qty 15

## 2022-05-28 MED ORDER — BUPIVACAINE-EPINEPHRINE 0.25% -1:200000 IJ SOLN
INTRAMUSCULAR | Status: DC | PRN
  Administered 2022-05-28: 23 mL

## 2022-05-28 MED ORDER — ORAL CARE MOUTH RINSE: 15.0000 mL | Freq: Once | OROMUCOSAL | Status: AC

## 2022-05-28 MED ORDER — FENTANYL CITRATE (PF) 100 MCG/2ML IJ SOLN: 100.0000 ug | Freq: Once | INTRAMUSCULAR | Status: AC

## 2022-05-28 MED ORDER — FENTANYL CITRATE (PF) 250 MCG/5ML IJ SOLN
INTRAMUSCULAR | Status: AC
  Filled 2022-05-28: qty 5

## 2022-05-28 MED ORDER — 0.9 % SODIUM CHLORIDE (POUR BTL) OPTIME
TOPICAL | Status: DC | PRN
  Administered 2022-05-28: 1000 mL

## 2022-05-28 MED ORDER — ONDANSETRON HCL 4 MG/2ML IJ SOLN
INTRAMUSCULAR | Status: AC
  Filled 2022-05-28: qty 2

## 2022-05-28 MED ORDER — SUCCINYLCHOLINE CHLORIDE 200 MG/10ML IV SOSY
PREFILLED_SYRINGE | INTRAVENOUS | Status: AC
  Filled 2022-05-28: qty 10

## 2022-05-28 MED ORDER — LACTATED RINGERS IV SOLN: INTRAVENOUS | Status: DC

## 2022-05-28 MED ORDER — SUCCINYLCHOLINE 20MG/ML (10ML) SYRINGE FOR MEDFUSION PUMP - OPTIME
INTRAMUSCULAR | Status: DC | PRN
  Administered 2022-05-28: 100 mg via INTRAVENOUS

## 2022-05-28 MED ORDER — ONDANSETRON HCL 4 MG/2ML IJ SOLN
INTRAMUSCULAR | Status: DC | PRN
  Administered 2022-05-28: 4 mg via INTRAVENOUS

## 2022-05-28 MED ORDER — MIDAZOLAM HCL 2 MG/2ML IJ SOLN: 2.0000 mg | Freq: Once | INTRAMUSCULAR | Status: AC

## 2022-05-28 MED ORDER — OXYCODONE HCL 5 MG/5ML PO SOLN: 5.0000 mg | Freq: Once | ORAL | Status: DC | PRN

## 2022-05-28 MED ORDER — PROMETHAZINE HCL 25 MG/ML IJ SOLN: 6.2500 mg | INTRAMUSCULAR | Status: DC | PRN

## 2022-05-28 MED ORDER — CEFAZOLIN SODIUM-DEXTROSE 2-4 GM/100ML-% IV SOLN: 2.0000 g | INTRAVENOUS | Status: DC

## 2022-05-28 MED ORDER — PHENYLEPHRINE HCL (PRESSORS) 10 MG/ML IV SOLN
INTRAVENOUS | Status: DC | PRN
  Administered 2022-05-28 (×3): 160 ug via INTRAVENOUS

## 2022-05-28 MED ORDER — OXYCODONE HCL 5 MG PO TABS
5.0000 mg | ORAL_TABLET | Freq: Four times a day (QID) | ORAL | 0 refills | Status: DC | PRN
Start: 2022-05-28 — End: 2022-07-03

## 2022-05-28 MED ORDER — ACETAMINOPHEN 500 MG PO TABS
1000.0000 mg | ORAL_TABLET | ORAL | Status: AC
  Administered 2022-05-28: 1000 mg via ORAL
  Filled 2022-05-28: qty 2

## 2022-05-28 MED ORDER — HYDROMORPHONE HCL 1 MG/ML IJ SOLN: 0.2500 mg | INTRAMUSCULAR | Status: DC | PRN

## 2022-05-28 MED ORDER — MEPERIDINE HCL 25 MG/ML IJ SOLN: 6.2500 mg | INTRAMUSCULAR | Status: DC | PRN

## 2022-05-28 MED ORDER — BUPIVACAINE-EPINEPHRINE (PF) 0.25% -1:200000 IJ SOLN
INTRAMUSCULAR | Status: AC
  Filled 2022-05-28: qty 30

## 2022-05-28 MED ORDER — PROPOFOL 10 MG/ML IV BOLUS
INTRAVENOUS | Status: DC | PRN
  Administered 2022-05-28: 50 mg via INTRAVENOUS
  Administered 2022-05-28: 75 mg via INTRAVENOUS
  Administered 2022-05-28: 200 mg via INTRAVENOUS
  Administered 2022-05-28: 50 mg via INTRAVENOUS

## 2022-05-28 SURGICAL SUPPLY — 46 items
APL PRP STRL LF DISP 70% ISPRP (MISCELLANEOUS) ×2
APL SKNCLS STERI-STRIP NONHPOA (GAUZE/BANDAGES/DRESSINGS) ×2
APPLIER CLIP 9.375 MED OPEN (MISCELLANEOUS) ×2
APR CLP MED 9.3 20 MLT OPN (MISCELLANEOUS) ×2
BENZOIN TINCTURE PRP APPL 2/3 (GAUZE/BANDAGES/DRESSINGS) ×2 IMPLANT
BINDER BREAST LRG (GAUZE/BANDAGES/DRESSINGS) IMPLANT
BINDER BREAST XLRG (GAUZE/BANDAGES/DRESSINGS) IMPLANT
CANISTER SUCT 3000ML PPV (MISCELLANEOUS) ×2 IMPLANT
CHLORAPREP W/TINT 26 (MISCELLANEOUS) ×2 IMPLANT
CLIP APPLIE 9.375 MED OPEN (MISCELLANEOUS) ×2 IMPLANT
CNTNR URN SCR LID CUP LEK RST (MISCELLANEOUS) ×2 IMPLANT
CONT SPEC 4OZ STRL OR WHT (MISCELLANEOUS) ×2
COVER PROBE W GEL 5X96 (DRAPES) ×2 IMPLANT
COVER SURGICAL LIGHT HANDLE (MISCELLANEOUS) ×2 IMPLANT
DEVICE DUBIN SPECIMEN MAMMOGRA (MISCELLANEOUS) ×2 IMPLANT
DRAPE CHEST BREAST 15X10 FENES (DRAPES) ×2 IMPLANT
DRSG TEGADERM 4X4.75 (GAUZE/BANDAGES/DRESSINGS) ×4 IMPLANT
ELECT BLADE 4.0 EZ CLEAN MEGAD (MISCELLANEOUS) ×2
ELECT CAUTERY BLADE 6.4 (BLADE) ×2 IMPLANT
ELECT REM PT RETURN 9FT ADLT (ELECTROSURGICAL) ×2
ELECTRODE BLDE 4.0 EZ CLN MEGD (MISCELLANEOUS) IMPLANT
ELECTRODE REM PT RTRN 9FT ADLT (ELECTROSURGICAL) ×2 IMPLANT
GAUZE SPONGE 2X2 8PLY STRL LF (GAUZE/BANDAGES/DRESSINGS) ×2 IMPLANT
GAUZE SPONGE 4X4 12PLY STRL (GAUZE/BANDAGES/DRESSINGS) IMPLANT
GLOVE BIO SURGEON STRL SZ7 (GLOVE) ×2 IMPLANT
GLOVE BIOGEL PI IND STRL 7.5 (GLOVE) ×2 IMPLANT
GOWN STRL REUS W/ TWL LRG LVL3 (GOWN DISPOSABLE) ×4 IMPLANT
GOWN STRL REUS W/TWL LRG LVL3 (GOWN DISPOSABLE) ×4
KIT BASIN OR (CUSTOM PROCEDURE TRAY) ×2 IMPLANT
KIT MARKER MARGIN INK (KITS) IMPLANT
LIGHT WAVEGUIDE WIDE FLAT (MISCELLANEOUS) IMPLANT
NDL 18GX1X1/2 (RX/OR ONLY) (NEEDLE) ×2 IMPLANT
NDL HYPO 25GX1X1/2 BEV (NEEDLE) ×4 IMPLANT
NEEDLE 18GX1X1/2 (RX/OR ONLY) (NEEDLE) ×2 IMPLANT
NEEDLE HYPO 25GX1X1/2 BEV (NEEDLE) ×2 IMPLANT
NS IRRIG 1000ML POUR BTL (IV SOLUTION) ×2 IMPLANT
PACK GENERAL/GYN (CUSTOM PROCEDURE TRAY) ×2 IMPLANT
SPONGE T-LAP 4X18 ~~LOC~~+RFID (SPONGE) ×2 IMPLANT
STRIP CLOSURE SKIN 1/2X4 (GAUZE/BANDAGES/DRESSINGS) ×2 IMPLANT
STRIP CLOSURE SKIN 1/4X4 (GAUZE/BANDAGES/DRESSINGS) IMPLANT
SUT MNCRL AB 4-0 PS2 18 (SUTURE) ×4 IMPLANT
SUT VIC AB 3-0 SH 27 (SUTURE) ×4
SUT VIC AB 3-0 SH 27X BRD (SUTURE) ×4 IMPLANT
SYR CONTROL 10ML LL (SYRINGE) ×4 IMPLANT
TOWEL GREEN STERILE (TOWEL DISPOSABLE) ×2 IMPLANT
TOWEL GREEN STERILE FF (TOWEL DISPOSABLE) ×2 IMPLANT

## 2022-05-28 NOTE — Anesthesia Postprocedure Evaluation (Signed)
Anesthesia Post Note  Patient: Meoshia Billing Kopp  Procedure(s) Performed: LEFT BREAST LUMPECTOMY WITH RADIOACTIVE SEED (Left: Breast) LEFT AXILLARY SENTINEL NODE BIOPSY (Left: Axilla)     Patient location during evaluation: PACU Anesthesia Type: Regional and General Level of consciousness: awake and alert Pain management: pain level controlled Vital Signs Assessment: post-procedure vital signs reviewed and stable Respiratory status: spontaneous breathing, nonlabored ventilation, respiratory function stable and patient connected to nasal cannula oxygen Cardiovascular status: blood pressure returned to baseline and stable Postop Assessment: no apparent nausea or vomiting Anesthetic complications: no   No notable events documented.  Last Vitals:  Vitals:   05/28/22 1345 05/28/22 1400  BP: 129/71 126/86  Pulse: 86 84  Resp: 14 10  Temp:  36.4 C  SpO2: 96% 95%    Last Pain:  Vitals:   05/28/22 1400  TempSrc:   PainSc: 0-No pain                 Earl Lites P Marzelle Rutten

## 2022-05-28 NOTE — Interval H&P Note (Signed)
History and Physical Interval Note:  05/28/2022 11:11 AM  Isabel [name]  has presented today for surgery, with the diagnosis of LEFT BREAST CANCER.  The various methods of treatment have been discussed with the patient and family. After consideration of risks, benefits and other options for treatment, the patient has consented to  Procedure(s): LEFT BREAST LUMPECTOMY WITH RADIOACTIVE SEED AND AXILLARY SENTINEL LYMPH NODE BIOPSY (Left) as a surgical intervention.  The patient's history has been reviewed, patient examined, no change in status, stable for surgery.  I have reviewed the patient's chart and labs.  Questions were answered to the patient's satisfaction.     Isabel [name]

## 2022-05-28 NOTE — Anesthesia Procedure Notes (Signed)
Procedure Name: Intubation Date/Time: 05/28/2022 12:18 PM  Performed by: Aundria Rud, CRNAPre-anesthesia Checklist: Patient identified, Emergency Drugs available, Suction available and Patient being monitored Patient Re-evaluated:Patient Re-evaluated prior to induction Oxygen Delivery Method: Circle System Utilized Preoxygenation: Pre-oxygenation with 100% oxygen Induction Type: IV induction Ventilation: Mask ventilation without difficulty Laryngoscope Size: Miller and 2 Grade View: Grade I Tube type: Oral Tube size: 7.0 mm Number of attempts: 1 Airway Equipment and Method: Stylet and Oral airway Placement Confirmation: ETT inserted through vocal cords under direct vision, positive ETCO2 and breath sounds checked- equal and bilateral Secured at: 21 cm Tube secured with: Tape Dental Injury: Teeth and Oropharynx as per pre-operative assessment  Comments: Attempted to place LMA 4 and LMA 5, patient with spasm as well as could not get great seal. Masked in between and succinylcholine given. Attempted with MAC 3 with grade 2b view. Grade 1 view with Hyacinth Meeker 2 and placed by Dr. Nance Pew. Small knick on lip, phenylephrine applied on gauze and pressure applied

## 2022-05-28 NOTE — Op Note (Signed)
Pre-op Diagnosis:  Left breast invasive ductal carcinoma Post-op Diagnosis: same Procedure:  Left breast radioactive seed localized lumpectomy and sentinel lymph node biopsy Surgeon:  Olivine Hiers K. Anesthesia:  GEN - LMA/ PEC block Indications:  This is a 62 year old female with no family history of breast cancer first-degree relatives who presents after recent diagnosis of breast cancer.  The patient is in relatively good health.  She is prediabetic but is on no medication for diabetes.  She was having noted breast problems when she recently underwent routine screening mammogram.  This showed an area of asymmetry in the upper inner left breast.  Ultrasound showed no sonographic correlate.  She underwent stereotactic biopsy of this area. 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 A radioactive seed was placed yesterday by Radiology.  The patient was injected in the preoperative area by radiology with technetium sulfur colloid.  Description of procedure: The patient is brought to the operating room placed in supine position on the operating room table. After an adequate level of general anesthesia was obtained, we injected methylene blue dye solution into the dermis around her nipple.  Her left breast and axilla were prepped with ChloraPrep and draped in sterile fashion. A timeout was taken to ensure the proper patient and proper procedure. We interrogated the breast with the neoprobe. We made a transverse incision in the left upper inner quadrant over the area of radioactivity after infiltrating with 0.25% Marcaine. Dissection was carried down in the breast tissue with cautery. We used the neoprobe to guide [place] towards the radioactive seed. We excised an area of tissue around the radioactive seed 3 cm in diameter. The specimen was removed and was oriented with a paint kit. Specimen mammogram showed the radioactive seed within the specimen.   The radioactive seed had been placed at the site of the invasive cancer since the biopsy marker had migrated inferiorly.  We excised an additional posterior inferior margin.  Specimen mammogram revealed the biopsy clip within this tissue.  The specimen was also oriented with the paint kit.  Both were sent for pathologic examination. There is no residual radioactivity within the biopsy cavity. Clips were placed in all five margins.  We inspected carefully for hemostasis. The wound was thoroughly irrigated.   We then turned our attention to the axilla.  The settings were adjusted on the Neoprobe and we interrogated the axilla.  I made a transverse incision over the area of activity.  We dissected into the axilla and identified a radioactive lymph node.  This was dissected free and sent as "sentinel lymph node #1".  We interrogated the axilla again and a second node was identified.  This was sent as "sentinel lymph node #2."  There was minimal background activity.  The wounds were closed with a deep layer of 3-0 Vicryl and a subcuticular layer of 4-0 Monocryl. Benzoin Steri-Strips were applied. The patient was then extubated and brought to the recovery room in stable condition. All sponge, instrument, and needle counts are correct.  Isabel [name]. Isabel Skains, MD, [facility] Surgery  General/ Trauma Surgery  12/27/2019 1:38 PM

## 2022-05-28 NOTE — Discharge Instructions (Signed)
[facility] Office Phone Number [phone]  BREAST BIOPSY/ PARTIAL MASTECTOMY: POST OP INSTRUCTIONS  Always review your discharge instruction sheet given to you by the facility where your surgery was performed.  IF YOU HAVE DISABILITY OR FAMILY LEAVE FORMS, YOU MUST BRING THEM TO THE OFFICE FOR PROCESSING.  DO NOT GIVE THEM TO YOUR DOCTOR.  A prescription for pain medication may be given to you upon discharge.  Take your pain medication as prescribed, if needed.  If narcotic pain medicine is not needed, then you may take acetaminophen (Tylenol) or ibuprofen (Advil) as needed. Take your usually prescribed medications unless otherwise directed If you need a refill on your pain medication, please contact your pharmacy.  They will contact our office to request authorization.  Prescriptions will not be filled after 5pm or on week-ends. You should eat very light the first 24 hours after surgery, such as soup, crackers, pudding, etc.  Resume your normal diet the day after surgery. Most patients will experience some swelling and bruising in the breast.  Ice packs and a good support bra will help.  Swelling and bruising can take several days to resolve.  It is common to experience some constipation if taking pain medication after surgery.  Increasing fluid intake and taking a stool softener will usually help or prevent this problem from occurring.  A mild laxative (Milk of Magnesia or Miralax) should be taken according to package directions if there are no bowel movements after 48 hours. Unless discharge instructions indicate otherwise, you may remove your bandages 24-48 hours after surgery, and you may shower at that time.  You may have steri-strips (small skin tapes) in place directly over the incision.  These strips should be left on the skin for 7-10 days.  If your surgeon used skin glue on the incision, you may shower in 24 hours.  The glue will flake off over the next 2-3 weeks.  Any  sutures or staples will be removed at the office during your follow-up visit. ACTIVITIES:  You may resume regular daily activities (gradually increasing) beginning the next day.  Wearing a good support bra or sports bra minimizes pain and swelling.  You may have sexual intercourse when it is comfortable. You may drive when you no longer are taking prescription pain medication, you can comfortably wear a seatbelt, and you can safely maneuver your car and apply brakes. RETURN TO WORK:  ______________________________________________________________________________________ You should see your doctor in the office for a follow-up appointment approximately two weeks after your surgery.  Your doctor's nurse will typically make your follow-up appointment when she calls you with your pathology report.  Expect your pathology report 2-3 business days after your surgery.  You may call to check if you do not hear from us after three days. OTHER INSTRUCTIONS: _______________________________________________________________________________________________ _____________________________________________________________________________________________________________________________________ _____________________________________________________________________________________________________________________________________ _____________________________________________________________________________________________________________________________________  WHEN TO CALL YOUR DOCTOR: Fever over 101.0 Nausea and/or vomiting. Extreme swelling or bruising. Continued bleeding from incision. Increased pain, redness, or drainage from the incision.  The clinic staff is available to answer your questions during regular business hours.  Please don't hesitate to call and ask to speak to one of the nurses for clinical concerns.  If you have a medical emergency, go to the nearest emergency room or call 911.  A surgeon from [facility] is always on call at the hospital.  For further questions, please visit [email or web address]   

## 2022-05-28 NOTE — Transfer of Care (Signed)
Immediate Anesthesia Transfer of Care Note  Patient: Isabel [name]  Procedure(s) Performed: LEFT BREAST LUMPECTOMY WITH RADIOACTIVE SEED (Left: Breast) LEFT AXILLARY SENTINEL NODE BIOPSY (Left: Axilla)  Patient Location: PACU  Anesthesia Type:GA combined with regional for post-op pain  Level of Consciousness: awake, alert , and drowsy  Airway & Oxygen Therapy: Patient Spontanous Breathing  Post-op Assessment: Report given to RN, Post -op Vital signs reviewed and stable, and Patient moving all extremities X 4  Post vital signs: Reviewed and stable  Last Vitals:  Vitals Value Taken Time  BP 130/65 05/28/22 1332  Temp    Pulse 97 05/28/22 1334  Resp 21 05/28/22 1334  SpO2 92 % 05/28/22 1334  Vitals shown include unvalidated device data.  Last Pain:  Vitals:   05/28/22 1048  TempSrc:   PainSc: 0-No pain         Complications: No notable events documented.

## 2022-05-29 ENCOUNTER — Encounter (HOSPITAL_COMMUNITY): Payer: Self-pay | Admitting: Surgery

## 2022-05-30 LAB — SURGICAL PATHOLOGY

## 2022-05-31 ENCOUNTER — Telehealth: Payer: Self-pay | Admitting: *Deleted

## 2022-05-31 ENCOUNTER — Encounter: Payer: Self-pay | Admitting: *Deleted

## 2022-05-31 NOTE — Telephone Encounter (Signed)
Ordered oncotype (core) due to no residual dz on final path. Sent requisition to patholgy and exact sciences.

## 2022-06-04 ENCOUNTER — Inpatient Hospital Stay

## 2022-06-04 ENCOUNTER — Inpatient Hospital Stay: Admitting: Hematology and Oncology

## 2022-06-06 MED ORDER — BUPIVACAINE HCL (PF) 0.5 % IJ SOLN
INTRAMUSCULAR | Status: DC | PRN
  Administered 2022-05-28: 30 mL

## 2022-06-06 NOTE — Addendum Note (Signed)
Addendum  created 06/06/22 0919 by Atilano Median, DO   Child order released for a procedure order, Clinical Note Signed, Intraprocedure Blocks edited, Intraprocedure Meds edited, Order Canceled from Note, SmartForm saved

## 2022-06-06 NOTE — Anesthesia Procedure Notes (Signed)
Anesthesia Regional Block: Pectoralis block   Pre-Anesthetic Checklist: , timeout performed,  Correct Patient, Correct Site, Correct Laterality,  Correct Procedure, Correct Position, site marked,  Risks and benefits discussed,  Surgical consent,  Pre-op evaluation,  At surgeon's request and post-op pain management  Laterality: Left  Prep: Dura Prep       Needles:  Injection technique: Single-shot  Needle Type: Echogenic Stimulator Needle     Needle Length: 10cm  Needle Gauge: 20     Additional Needles:   Procedures:,,,, ultrasound used (permanent image in chart),,    Narrative:  Start time: 05/28/2022 11:45 AM End time: 05/28/2022 11:50 AM Injection made incrementally with aspirations every 5 mL.  Performed by: Personally  Anesthesiologist: Atilano Median, DO  Additional Notes: Patient identified. Risks/Benefits/Options discussed with patient including but not limited to bleeding, infection, nerve damage, failed block, incomplete pain control. Patient expressed understanding and wished to proceed. All questions were answered. Sterile technique was used throughout the entire procedure. Please see nursing notes for vital signs. Aspirated in 5cc intervals with injection for negative confirmation. Patient was given instructions on fall risk and not to get out of bed. All questions and concerns addressed with instructions to call with any issues or inadequate analgesia.

## 2022-06-11 ENCOUNTER — Telehealth: Payer: Self-pay | Admitting: *Deleted

## 2022-06-11 NOTE — Telephone Encounter (Signed)
Received oncotype score of 36. Physician team notified. Called pt to discuss moving appt to see Dr. Al Pimple to discuss oncotype results. Scheduled and confirmed appt to be seen on 5/8 at 11am.

## 2022-06-12 ENCOUNTER — Inpatient Hospital Stay: Attending: Hematology and Oncology | Admitting: Hematology and Oncology

## 2022-06-12 DIAGNOSIS — Z8042 Family history of malignant neoplasm of prostate: Secondary | ICD-10-CM | POA: Diagnosis not present

## 2022-06-12 DIAGNOSIS — Z7982 Long term (current) use of aspirin: Secondary | ICD-10-CM | POA: Diagnosis not present

## 2022-06-12 DIAGNOSIS — Z803 Family history of malignant neoplasm of breast: Secondary | ICD-10-CM | POA: Diagnosis not present

## 2022-06-12 DIAGNOSIS — Z5111 Encounter for antineoplastic chemotherapy: Secondary | ICD-10-CM | POA: Diagnosis present

## 2022-06-12 DIAGNOSIS — Z5189 Encounter for other specified aftercare: Secondary | ICD-10-CM | POA: Diagnosis not present

## 2022-06-12 DIAGNOSIS — Z79899 Other long term (current) drug therapy: Secondary | ICD-10-CM | POA: Diagnosis not present

## 2022-06-12 DIAGNOSIS — C50212 Malignant neoplasm of upper-inner quadrant of left female breast: Secondary | ICD-10-CM | POA: Diagnosis not present

## 2022-06-12 DIAGNOSIS — Z170 Estrogen receptor positive status [ER+]: Secondary | ICD-10-CM | POA: Diagnosis not present

## 2022-06-12 DIAGNOSIS — R5383 Other fatigue: Secondary | ICD-10-CM | POA: Insufficient documentation

## 2022-06-12 DIAGNOSIS — E669 Obesity, unspecified: Secondary | ICD-10-CM | POA: Insufficient documentation

## 2022-06-12 DIAGNOSIS — R739 Hyperglycemia, unspecified: Secondary | ICD-10-CM | POA: Diagnosis not present

## 2022-06-12 DIAGNOSIS — K219 Gastro-esophageal reflux disease without esophagitis: Secondary | ICD-10-CM | POA: Insufficient documentation

## 2022-06-12 NOTE — Progress Notes (Signed)
[facility] CONSULT NOTE  Patient Care Team: [name], MD as PCP - General (Internal Medicine) Carrington Clamp, MD (Obstetrics and Gynecology) Pyrtle, Carie Caddy, MD (Gastroenterology) Pershing Proud, RN as Oncology Nurse Navigator Donnelly Angelica, RN as Oncology Nurse Navigator Rachel Moulds, MD as Consulting Physician (Hematology and Oncology) Manus Rudd, MD as Consulting Physician (General Surgery) Dorothy Puffer, MD as Consulting Physician (Radiation Oncology)  CHIEF COMPLAINTS/PURPOSE OF CONSULTATION:  Newly diagnosed breast cancer  HISTORY OF PRESENTING ILLNESS:  Isabel [name] 62 y.o. female is here because of recent diagnosis of left   I reviewed her records extensively and collaborated the history with the patient.  SUMMARY OF ONCOLOGIC HISTORY: Oncology History  Malignant neoplasm of upper-inner quadrant of left breast in female, estrogen receptor positive (HCC)  04/02/2022 Mammogram   In the left breast a possible asymmetry warrants further evaluation.  In the right breast, no findings suspicious for malignancy.  Further evaluation is suggested for possible asymmetry in the left breast.  Ultrasound breast showed indeterminate left breast focal asymmetry with possible distortion.   04/30/2022 Oncotype testing   Oncotype DX resulted at 36, distant recurrence risk at 9 years with antiestrogen alone of 24% and group average absolute chemo benefit of greater than 15%   05/13/2022 Initial Diagnosis   Malignant neoplasm of upper-inner quadrant of left breast in female, estrogen receptor positive    Pathology Results   Left breast needle core biopsy showed invasive ductal carcinoma overall grade 2, DCIS intermediate to high-grade, prognostic showed ER 100% positive strong staining PR 0% negative, Ki-67 of 10% and group 5 HER2 negative   05/28/2022 Definitive Surgery   Left lumpectomy showed no evidence of residual malignancy, 2 axillary lymph nodes  negative for carcinoma.  Original biopsy showed 7 mm invasive ductal carcinoma.    Isabel [name] is here for follow-up.  Since her last visit here, she had lumpectomy which did not show any evidence of residual malignancy.  However evaluated her Oncotype DX score, this showed recurrence score of 34, distant recurrence risk at 9 years of 24% and benefit from absolute chemotherapy.  Given this information, although there is no residual malignancy on the final surgery, I recommended that she consider adjuvant chemotherapy with TC.  She is here to talk about it.  Rest of the pertinent 10 point ROS reviewed and negative  MEDICAL HISTORY:  Past Medical History:  Diagnosis Date   Breast cancer (HCC) 05/01/2022   GERD (gastroesophageal reflux disease)    HYPERGLYCEMIA    OBESITY    Pneumonia    Pre-diabetes     SURGICAL HISTORY: Past Surgical History:  Procedure Laterality Date   AXILLARY SENTINEL NODE BIOPSY Left 05/28/2022   Procedure: LEFT AXILLARY SENTINEL NODE BIOPSY;  Surgeon: Manus Rudd, MD;  Location: [facility] OR;  Service: General;  Laterality: Left;   BREAST BIOPSY Left 04/30/2022   MM LT BREAST BX W LOC DEV 1ST LESION IMAGE BX SPEC STEREO GUIDE 04/30/2022 [facility]   BREAST BIOPSY  05/27/2022   MM LT RADIOACTIVE SEED LOC MAMMO GUIDE 05/27/2022 [facility]   BREAST LUMPECTOMY WITH RADIOACTIVE SEED AND SENTINEL LYMPH NODE BIOPSY Left 05/28/2022   Procedure: LEFT BREAST LUMPECTOMY WITH RADIOACTIVE SEED;  Surgeon: Manus Rudd, MD;  Location: [facility] OR;  Service: General;  Laterality: Left;   CESAREAN SECTION  1992   LAPAROSCOPIC CHOLECYSTECTOMY  2005    SOCIAL HISTORY: Social History   Socioeconomic History   Marital status: Married  Spouse name: Not on file   Number of children: Not on file   Years of education: Not on file   Highest education level: Not on file  Occupational History   Not on file  Tobacco Use   Smoking status: Never   Smokeless tobacco: Never    Tobacco comments:    Married, lives with spouse. works at [employer] Use: Never used  Substance and Sexual Activity   Alcohol use: Not Currently    Alcohol/week: 2.0 standard drinks of alcohol    Types: 2 Glasses of wine per week   Drug use: No   Sexual activity: Not on file  Other Topics Concern   Not on file  Social History Narrative   Not on file   Social Determinants of Health   Financial Resource Strain: Not on file  Food Insecurity: Not on file  Transportation Needs: Not on file  Physical Activity: Not on file  Stress: Not on file  Social Connections: Not on file  Intimate Partner Violence: Not on file    FAMILY HISTORY: Family History  Problem Relation Age of Onset   Prostate cancer Father    Breast cancer Maternal Aunt 55   Stroke Maternal Grandmother    Hypertension Brother    Kidney disease Brother    Colon cancer Neg Hx    Stomach cancer Neg Hx     ALLERGIES:  has No Known Allergies.  MEDICATIONS:  Current Outpatient Medications  Medication Sig Dispense Refill   aspirin EC 81 MG tablet Take 81 mg by mouth daily.     Calcium Carb-Cholecalciferol (CALCIUM-VITAMIN D) 500-200 MG-UNIT tablet Take 1 tablet by mouth daily.     Multiple Vitamins-Calcium (ONE-A-DAY WOMENS FORMULA) TABS Take 1 tablet by mouth daily.     Omega-3 Fatty Acids (FISH OIL) 1000 MG CAPS Take 1,000 mg by mouth daily.     oxyCODONE (OXY IR/ROXICODONE) 5 MG immediate release tablet Take 1 tablet (5 mg total) by mouth every 6 (six) hours as needed for severe pain. 20 tablet 0   VITAMIN E PO Take 1 capsule by mouth daily.     No current facility-administered medications for this visit.    REVIEW OF SYSTEMS:   Constitutional: Denies fevers, chills or abnormal night sweats Eyes: Denies blurriness of vision, double vision or watery eyes Ears, nose, mouth, throat, and face: Denies mucositis or sore throat Respiratory: Denies cough, dyspnea or  wheezes Cardiovascular: Denies palpitation, chest discomfort or lower extremity swelling Gastrointestinal:  Denies nausea, heartburn or change in bowel habits Skin: Denies abnormal skin rashes Lymphatics: Denies new lymphadenopathy or easy bruising Neurological:Denies numbness, tingling or new weaknesses Behavioral/Psych: Mood is stable, no new changes  Breast: Denies any palpable lumps or discharge All other systems were reviewed with the patient and are negative.  PHYSICAL EXAMINATION: ECOG PERFORMANCE STATUS: 0 - Asymptomatic  Vitals:   06/12/22 1105  BP: (!) 143/62  Pulse: 90  Resp: 18  Temp: (!) 97.4 F (36.3 C)  SpO2: 100%    Filed Weights   06/12/22 1105  Weight: 224 lb (101.6 kg)     GENERAL:alert, no distress and comfortable   LABORATORY DATA:  I have reviewed the data as listed Lab Results  Component Value Date   WBC 5.3 05/23/2022   HGB 13.8 05/23/2022   HCT 41.6 05/23/2022   MCV 84.2 05/23/2022   PLT 313 05/23/2022   Lab Results  Component Value Date  NA 140 04/01/2022   K 4.8 04/01/2022   CL 104 04/01/2022   CO2 29 04/01/2022    RADIOGRAPHIC STUDIES: I have personally reviewed the radiological reports and agreed with the findings in the report.  ASSESSMENT AND PLAN:  Malignant neoplasm of upper-inner quadrant of left breast in female, estrogen receptor positive (HCC) This is a 62 year old female patient with newly diagnosed left breast upper inner quadrant invasive ductal carcinoma, grade 2, intermediate to high-grade DCIS ER 100% strong staining PR 0% negative, Ki-67 of 10% and group 5 HER2 negative who is referred to medical oncology for additional recommendations.  She is currently scheduled for surgery on 423.  The exact size of the tumor remains undetermined although on the biopsy it measured approximately 7 mm.  She underwent surgery which showed no evidence of residual disease. Oncotype Dx from the core specimen however showed score of 34,  distant risk recurrence of 24% estimated at 9 years, benefit from chemotherapy noted.  We have reviewed the Oncotype DX score and recommended to consider adjuvant chemotherapy with TC every 21 days for 4 cycles. I have reviewed the regimen every 21 days for 4 cycles, she refuses port at this time.  We have discussed about adverse effects including but not limited to fatigue, nausea, vomiting, diarrhea, constipation, increased risk of infections, neuropathy etc.  She understands that some of the side effects can unfortunately be permanent.  She is willing to proceed with the recommendations.  She will see Dr. Corliss Skains soon for postop clearance.  She would like to start around third or fourth week of May.     All questions were answered. The patient knows to call the clinic with any problems, questions or concerns.    Rachel Moulds, MD 06/13/22

## 2022-06-12 NOTE — Assessment & Plan Note (Signed)
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  She is currently scheduled for surgery on 423.  The exact size of the tumor remains undetermined although on the biopsy it measured approximately 7 mm.  She underwent surgery which showed no evidence of residual disease. Oncotype Dx from the core specimen however showed score of 34, distant risk recurrence of 24% estimated at 9 years, benefit from chemotherapy noted.  We have reviewed the Oncotype DX score and recommended to consider adjuvant chemotherapy with TC every 21 days for 4 cycles. I have reviewed the regimen every 21 days for 4 cycles, she refuses port at this time.  We have discussed about adverse effects including but not limited to fatigue, nausea, vomiting, diarrhea, constipation, increased risk of infections, neuropathy etc.  She understands that some of the side effects can unfortunately be permanent.  She is willing to proceed with the recommendations.  She will see Dr. Corliss Skains soon for postop clearance.  She would like to start around third or fourth week of May.

## 2022-06-13 ENCOUNTER — Ambulatory Visit

## 2022-06-13 ENCOUNTER — Telehealth: Payer: Self-pay | Admitting: Hematology and Oncology

## 2022-06-13 ENCOUNTER — Encounter: Payer: Self-pay | Admitting: Hematology and Oncology

## 2022-06-13 NOTE — Telephone Encounter (Signed)
Spoke with patient confirming upcoming appointments  

## 2022-06-13 NOTE — Progress Notes (Signed)
START ON PATHWAY REGIMEN - Breast     A cycle is every 21 days:     Cyclophosphamide      Docetaxel   **Always confirm dose/schedule in your pharmacy ordering system**  Patient Characteristics: Postoperative without Neoadjuvant Therapy, M0 (Pathologic Staging), Invasive Disease, Adjuvant Therapy, HER2 Negative, ER Positive, Node Negative, pT1a, pN71mi or pT1b-c, pN0/N69mi or pT2 or Higher, pN0, Oncotype High Risk (? 26) Therapeutic Status: Postoperative without Neoadjuvant Therapy, M0 (Pathologic Staging) AJCC Grade: G2 AJCC N Category: pN0 AJCC M Category: cM0 ER Status: Positive (+) AJCC 8 Stage Grouping: IA HER2 Status: Negative (-) Oncotype Dx Recurrence Score: 34 AJCC T Category: pT1b PR Status: Negative (-) Has this patient completed genomic testing<= Yes - Oncotype DX(R) Intent of Therapy: Curative Intent, Discussed with Patient

## 2022-06-14 ENCOUNTER — Other Ambulatory Visit: Payer: Self-pay

## 2022-06-14 ENCOUNTER — Encounter: Payer: Self-pay | Admitting: *Deleted

## 2022-06-17 ENCOUNTER — Encounter: Payer: Self-pay | Admitting: *Deleted

## 2022-06-17 ENCOUNTER — Telehealth: Payer: Self-pay | Admitting: *Deleted

## 2022-06-17 NOTE — Telephone Encounter (Signed)
Pt called to discuss chemo treatment plan as well to move injection to 5/25 as she will be going out of town.  Discussed s/e r/t chemo as well walking plan and hydration. Scheduling msg sent to r/s injection.

## 2022-06-19 ENCOUNTER — Other Ambulatory Visit

## 2022-06-19 ENCOUNTER — Ambulatory Visit: Admitting: Pharmacist

## 2022-06-20 ENCOUNTER — Ambulatory Visit: Attending: Surgery | Admitting: Rehabilitation

## 2022-06-20 ENCOUNTER — Telehealth: Payer: Self-pay | Admitting: *Deleted

## 2022-06-20 ENCOUNTER — Encounter: Payer: Self-pay | Admitting: Rehabilitation

## 2022-06-20 DIAGNOSIS — Z170 Estrogen receptor positive status [ER+]: Secondary | ICD-10-CM | POA: Diagnosis present

## 2022-06-20 DIAGNOSIS — C50212 Malignant neoplasm of upper-inner quadrant of left female breast: Secondary | ICD-10-CM | POA: Insufficient documentation

## 2022-06-20 DIAGNOSIS — Z9189 Other specified personal risk factors, not elsewhere classified: Secondary | ICD-10-CM | POA: Insufficient documentation

## 2022-06-20 DIAGNOSIS — Z483 Aftercare following surgery for neoplasm: Secondary | ICD-10-CM | POA: Diagnosis present

## 2022-06-20 NOTE — Progress Notes (Signed)
Pharmacist Chemotherapy Monitoring - Initial Assessment    Anticipated start date: 06/27/22   The following has been reviewed per standard work regarding the patient's treatment regimen: The patient's diagnosis, treatment plan and drug doses, and organ/hematologic function Lab orders and baseline tests specific to treatment regimen  The treatment plan start date, drug sequencing, and pre-medications Prior authorization status  Patient's documented medication list, including drug-drug interaction screen and prescriptions for anti-emetics and supportive care specific to the treatment regimen The drug concentrations, fluid compatibility, administration routes, and timing of the medications to be used The patient's access for treatment and lifetime cumulative dose history, if applicable  The patient's medication allergies and previous infusion related reactions, if applicable   Changes made to treatment plan:  N/A  Follow up needed:  Home antiemetics   Janeice Robinson, RPH, 06/20/2022  3:03 PM

## 2022-06-20 NOTE — Telephone Encounter (Signed)
Received call from patient asking about dignicap.  Gave instructions on how to register and get her delta kit and delta card. Patient states she is unsure if she wants to do this or not.  Informed her to call and let us know as soon as she can because her time would need to be adjusted for chemo and also to make sure there is a machine available on the day she starts. Patient verbalized understanding and will let me know as soon as possible.

## 2022-06-20 NOTE — Therapy (Signed)
OUTPATIENT PHYSICAL THERAPY BREAST CANCER POST OP FOLLOW UP   Patient Name: Isabel [name] MRN: [id number] DOB:[birth year], 62 y.o., female Today's Date: 06/20/2022  END OF SESSION:  PT End of Session - 06/20/22 0840     Visit Number 2    Number of Visits 2    Date for PT Re-Evaluation 07/12/22    PT Start Time 0800    PT Stop Time 0831    PT Time Calculation (min) 31 min    Activity Tolerance Patient tolerated treatment well    Behavior During Therapy [facility] for tasks assessed/performed             Past Medical History:  Diagnosis Date   Breast cancer (HCC) 05/01/2022   GERD (gastroesophageal reflux disease)    HYPERGLYCEMIA    OBESITY    Pneumonia    Pre-diabetes    Past Surgical History:  Procedure Laterality Date   AXILLARY SENTINEL NODE BIOPSY Left 05/28/2022   Procedure: LEFT AXILLARY SENTINEL NODE BIOPSY;  Surgeon: Manus Rudd, MD;  Location: [facility] OR;  Service: General;  Laterality: Left;   BREAST BIOPSY Left 04/30/2022   MM LT BREAST BX W LOC DEV 1ST LESION IMAGE BX SPEC STEREO GUIDE 04/30/2022 [facility]   BREAST BIOPSY  05/27/2022   MM LT RADIOACTIVE SEED LOC MAMMO GUIDE 05/27/2022 [facility]   BREAST LUMPECTOMY WITH RADIOACTIVE SEED AND SENTINEL LYMPH NODE BIOPSY Left 05/28/2022   Procedure: LEFT BREAST LUMPECTOMY WITH RADIOACTIVE SEED;  Surgeon: Manus Rudd, MD;  Location: [facility] OR;  Service: General;  Laterality: Left;   CESAREAN SECTION  1992   LAPAROSCOPIC CHOLECYSTECTOMY  2005   Patient Active Problem List   Diagnosis Date Noted   Malignant neoplasm of upper-inner quadrant of left breast in female, estrogen receptor positive (HCC) 05/13/2022   Pre-diabetes 04/01/2022   Left knee pain 08/21/2017   Routine general medical examination at a health care facility 09/29/2014   Obesity (BMI 35.0-39.9 without comorbidity) 04/18/2009    PCP: Dr. Hillard Danker   REFERRING PROVIDER: Dr. Manus Rudd   REFERRING DIAG: Left breast  cancer  THERAPY DIAG:  Malignant neoplasm of upper-inner quadrant of left breast in female, estrogen receptor positive [facility])  Aftercare following surgery for neoplasm  At risk for lymphedema  Rationale for Evaluation and Treatment: Rehabilitation  ONSET DATE: 05/13/22  SUBJECTIVE:                                                                                                                                                                                           SUBJECTIVE STATEMENT: I am doing very well. Denies swelling.  Still  wearing compression.    PERTINENT HISTORY:  Lt lumpectomy and SLNB on 05/28/22 with 2 negative nodes removed. Will be having 4 weeks of chemo and then radiation.    PATIENT GOALS:  Reassess how my recovery is going related to arm function, pain, and swelling.  PAIN:  Are you having pain? No  PRECAUTIONS: Recent Surgery, left UE Lymphedema risk  ACTIVITY LEVEL / LEISURE: back    OBJECTIVE:   PATIENT SURVEYS:  QUICK DASH: 0%  OBSERVATIONS: Healing well with steristrips in place.    POSTURE:  Rounded shoulders   LYMPHEDEMA ASSESSMENT:  UPPER EXTREMITY AROM/PROM:   A/PROM RIGHT   eval    Shoulder extension 50  Shoulder flexion 155  Shoulder abduction 160  Shoulder internal rotation    Shoulder external rotation 85                          (Blank rows = not tested)   A/PROM LEFT   eval 06/20/22  Shoulder extension 50 60  Shoulder flexion 155 165  Shoulder abduction 170 170  Shoulder internal rotation     Shoulder external rotation 85 85                          (Blank rows = not tested)   CERVICAL AROM: All within normal limits:    UPPER EXTREMITY STRENGTH: 5/5 strength    LYMPHEDEMA ASSESSMENTS:    LANDMARK RIGHT   eval  10 cm proximal to olecranon process 37  Olecranon process 32  10 cm proximal to ulnar styloid process 16.7  Just proximal to ulnar styloid process 19.5  Across hand at thumb web space 21.2  At base of  2nd digit 8.0  (Blank rows = not tested)   LANDMARK LEFT   eval 06/20/22  10 cm proximal to olecranon process 35.2 35.2  Olecranon process 31.8 31.8  10 cm proximal to ulnar styloid process 25.3 25.3  Just proximal to ulnar styloid process 19.1 19.1  Across hand at thumb web space 21 21  At base of 2nd digit 8.3 8.3  (Blank rows = not tested)  PATIENT EDUCATION:  Education details: post op per below Person educated: Patient Education method: Programmer, multimedia, Facilities manager, and Verbal cues Education comprehension: verbalized understanding and returned demonstration  HOME EXERCISE PROGRAM: Reviewed previously given post op HEP, live strong and yoga options   ASSESSMENT:  CLINICAL IMPRESSION: Pt is doing very well and is back to all pre-op activities.  She will go on to chemo and radiation next and knows when to return.   Pt will benefit from skilled therapeutic intervention to improve on the following deficits: Decreased knowledge of precautions, impaired UE functional use, pain, decreased ROM, postural dysfunction.   PT treatment/interventions: ADL/Self care home management, Therapeutic exercises, Patient/Family education, Self Care, Manual therapy, and Re-evaluation   GOALS: Goals reviewed with patient? Yes  LONG TERM GOALS:  (STG=LTG)  GOALS Name Target Date  Goal status  1 Pt will demonstrate she has regained full shoulder ROM and function post operatively compared to baselines.  Baseline: 06/20/22 INITIAL                    PLAN:  PT FREQUENCY/DURATION: SOZO surveillance  PLAN FOR NEXT SESSION: SOZO surveillance   [facility]  [address]  [place] [address]  [phone]  After Breast Cancer Class It is recommended  you attend the ABC class to be educated on lymphedema risk reduction. This class is free of charge and lasts for 1 hour. It is a 1-time class. You will need to download the TEAMS app either on your phone or  computer. We will send you a link the night before or the morning of the class. You should be able to click on that link to join the class. This is not a confidential class. You don't have to turn your camera on, but other participants may be able to see your email address.  Scar massage You can begin gentle scar massage to you incision sites. Gently place one hand on the incision and move the skin (without sliding on the skin) in various directions. Do this for a few minutes and then you can gently massage either coconut oil or vitamin E cream into the scars.  Compression garment You should continue wearing your compression bra until you feel like you no longer have swelling.  Home exercise Program Continue doing the exercises you were given until you feel like you can do them without feeling any tightness at the end.   Walking Program Studies show that 30 minutes of walking per day (fast enough to elevate your heart rate) can significantly reduce the risk of a cancer recurrence. If you can't walk due to other medical reasons, we encourage you to find another activity you could do (like a stationary bike or water exercise).  Posture After breast cancer surgery, people frequently sit with rounded shoulders posture because it puts their incisions on slack and feels better. If you sit like this and scar tissue forms in that position, you can become very tight and have pain sitting or standing with good posture. Try to be aware of your posture and sit and stand up tall to heal properly.  Follow up PT: It is recommended you return every 3 months for the first 3 years following surgery to be assessed on the SOZO machine for an L-Dex score. This helps prevent clinically significant lymphedema in 95% of patients. These follow up screens are 10 minute appointments that you are not billed for.  [name], PT 06/20/2022, 8:41 AM

## 2022-06-21 ENCOUNTER — Other Ambulatory Visit: Payer: Self-pay

## 2022-06-25 ENCOUNTER — Other Ambulatory Visit: Payer: Self-pay

## 2022-06-25 ENCOUNTER — Inpatient Hospital Stay

## 2022-06-25 ENCOUNTER — Inpatient Hospital Stay: Admitting: Pharmacist

## 2022-06-25 DIAGNOSIS — C50212 Malignant neoplasm of upper-inner quadrant of left female breast: Secondary | ICD-10-CM

## 2022-06-25 DIAGNOSIS — Z5111 Encounter for antineoplastic chemotherapy: Secondary | ICD-10-CM | POA: Diagnosis not present

## 2022-06-25 MED ORDER — PROCHLORPERAZINE MALEATE 10 MG PO TABS
10.0000 mg | ORAL_TABLET | Freq: Four times a day (QID) | ORAL | 1 refills | Status: DC | PRN
Start: 2022-06-25 — End: 2022-09-12

## 2022-06-25 MED ORDER — ONDANSETRON HCL 8 MG PO TABS
8.0000 mg | ORAL_TABLET | Freq: Three times a day (TID) | ORAL | 1 refills | Status: DC | PRN
Start: 2022-06-25 — End: 2022-09-12

## 2022-06-25 MED ORDER — DEXAMETHASONE 4 MG PO TABS
ORAL_TABLET | ORAL | 1 refills | Status: DC
Start: 2022-06-25 — End: 2022-09-12

## 2022-06-25 NOTE — Progress Notes (Signed)
[facility]       Telephone: [phone]?Fax: [phone]   Oncology Clinical Pharmacist Practitioner Initial Assessment  Isabel [name] is a 62 y.o. female with a diagnosis of breast cancer. They were contacted today via in-person visit.  Indication/Regimen Docetaxel (Taxotere) and cyclophosphamide (Cytoxan) are being used appropriately for treatment of breast cancer by Dr. Rachel Moulds.      Wt Readings from Last 1 Encounters:  06/12/22 224 lb (101.6 kg)    Estimated body surface area is 2.18 meters squared as calculated from the following:   Height as of 06/12/22: 5\' 6"  (1.676 m).   Weight as of 06/12/22: 224 lb (101.6 kg).  The dosing regimen cycle is every 21 days x 4 cycles  Docetaxel (75 mg/m2) on Day 1 Cyclophosphamide (600 mg/m2) on Day 1 Pegfilgrastim (6 mg) on Day 3  It is planned to continue until treatment plan completion or unacceptable toxicity. The tentative start date is: 06/27/22  Dose Modifications No dose reductions planned currently   Allergies No Known Allergies  Vitals = No vitals or labs were done today for this chemotherapy education visit   Contraindications Contraindications were reviewed? Yes Contraindications to therapy were identified? No   Safety Precautions The following safety precautions were reviewed:  Fever: reviewed the importance of having a thermometer and the Centers for Disease Control and Prevention (CDC) definition of fever which is 100.27F (38C) or higher. Patient should call 24/7 triage at [phone] if experiencing a fever or any other symptoms Decreased white blood cells (WBCs) and increased risk for infection Decreased platelet count and increased risk of bleeding Decreased hemoglobin, part of the red blood cells that carry iron and oxygen Hair Loss Fatigue Fluid retention or swelling (edema) Peripheral Neuropathy Mouth sores Rash or itchy skin Muscle or joint pain or weakness Nausea or  vomiting Diarrhea Nail Changes Hypersensitivity reactions Secondary Malignancies Hemorrhagic cystitis Pneumonitis Handling body fluids and waste Intimacy, sexual activity, contraception, fertility  Medication Reconciliation Current Outpatient Medications  Medication Sig Dispense Refill   aspirin EC 81 MG tablet Take 81 mg by mouth daily.     Calcium Carb-Cholecalciferol (CALCIUM-VITAMIN D) 500-200 MG-UNIT tablet Take 1 tablet by mouth daily.     Multiple Vitamins-Calcium (ONE-A-DAY WOMENS FORMULA) TABS Take 1 tablet by mouth daily.     Multiple Vitamins-Minerals (EMERGEN-C VITAMIN C LITE) PACK Take by mouth.     Omega-3 Fatty Acids (FISH OIL) 1000 MG CAPS Take 1,000 mg by mouth daily.     VITAJOY BIOTIN GUMMIES PO Take by mouth daily.     VITAMIN E PO Take 1 capsule by mouth daily.     dexamethasone (DECADRON) 4 MG tablet Take 2 tabs by mouth 2 times daily starting day before chemo. Then take 2 tabs daily for 2 days starting day after chemo. Take with food. (Patient not taking: Reported on 06/25/2022) 30 tablet 1   ondansetron (ZOFRAN) 8 MG tablet Take 1 tablet (8 mg total) by mouth every 8 (eight) hours as needed for nausea or vomiting. Start on the third day after chemotherapy. (Patient not taking: Reported on 06/25/2022) 30 tablet 1   oxyCODONE (OXY IR/ROXICODONE) 5 MG immediate release tablet Take 1 tablet (5 mg total) by mouth every 6 (six) hours as needed for severe pain. (Patient not taking: Reported on 06/25/2022) 20 tablet 0   prochlorperazine (COMPAZINE) 10 MG tablet Take 1 tablet (10 mg total) by mouth every 6 (six) hours as needed for nausea or  vomiting. (Patient not taking: Reported on 06/25/2022) 30 tablet 1   No current facility-administered medications for this visit.   Medication reconciliation is based on the patient's most recent medication list in the electronic medical record (EMR) including herbal products and OTC medications.   The patient's medication list was  reviewed today with the patient? Yes   Drug-drug interactions (DDIs) DDIs were evaluated? Yes Significant DDIs identified? No   Drug-Food Interactions Drug-food interactions were evaluated? Yes Drug-food interactions identified? No   Follow-up Plan  Treatment start date: 06/27/22 [facility] placement date: patient prefers peripheral administration. Dr. Al Pimple is aware. EMLA cream prescription not sent. Sent dexamethasone, prochlorperazine, and ondansetron prescriptions to her local pharmacy of choice. We reviewed the prescriptions, premedications, and treatment regimen with the patient. Possible side effects of the treatment regimen were reviewed and management strategies were discussed.  Can use loperamide as needed for diarrhea, loratadine as needed for G-CSF bone pain, and Senna-S as needed for constipation.  Clinical pharmacy will assist Dr. Rachel Moulds and Isabel [name] on an as needed basis going forward  Isabel [name] participated in the discussion, expressed understanding, and voiced agreement with the above plan. All questions were answered to her satisfaction. The patient was advised to contact the clinic at [phone] with any questions or concerns prior to her return visit.   I spent 60 minutes assessing the patient.  Yehoshua Vitelli A. Odetta Pink, PharmD, BCOP, CPP  Anselm Lis, RPH-CPP, 06/25/2022 11:42 AM  **Disclaimer: This note was dictated with voice recognition software. Similar sounding words can inadvertently be transcribed and this note may contain transcription errors which may not have been corrected upon publication of note.**

## 2022-06-26 ENCOUNTER — Encounter: Payer: Self-pay | Admitting: Hematology and Oncology

## 2022-06-26 NOTE — Progress Notes (Signed)
Called pt to introduce myself as her Financial Resource Specialist and to discuss the [employer] grant.  I left a msg requesting she return my call if she's interested in applying for the grant.  

## 2022-06-27 ENCOUNTER — Other Ambulatory Visit: Payer: Self-pay

## 2022-06-27 ENCOUNTER — Inpatient Hospital Stay

## 2022-06-27 ENCOUNTER — Inpatient Hospital Stay (HOSPITAL_BASED_OUTPATIENT_CLINIC_OR_DEPARTMENT_OTHER)

## 2022-06-27 ENCOUNTER — Encounter: Payer: Self-pay | Admitting: Adult Health

## 2022-06-27 ENCOUNTER — Other Ambulatory Visit

## 2022-06-27 ENCOUNTER — Inpatient Hospital Stay (HOSPITAL_BASED_OUTPATIENT_CLINIC_OR_DEPARTMENT_OTHER): Admitting: Adult Health

## 2022-06-27 ENCOUNTER — Encounter: Payer: Self-pay | Admitting: Hematology and Oncology

## 2022-06-27 VITALS — BP 133/59 | HR 78 | Resp 18

## 2022-06-27 VITALS — BP 154/62 | HR 92 | Temp 98.1°F | Resp 18 | Ht 66.0 in | Wt 222.8 lb

## 2022-06-27 DIAGNOSIS — Z5111 Encounter for antineoplastic chemotherapy: Secondary | ICD-10-CM | POA: Diagnosis not present

## 2022-06-27 DIAGNOSIS — Z170 Estrogen receptor positive status [ER+]: Secondary | ICD-10-CM

## 2022-06-27 DIAGNOSIS — C50212 Malignant neoplasm of upper-inner quadrant of left female breast: Secondary | ICD-10-CM

## 2022-06-27 DIAGNOSIS — E559 Vitamin D deficiency, unspecified: Secondary | ICD-10-CM

## 2022-06-27 LAB — CBC WITH DIFFERENTIAL (CANCER CENTER ONLY)
Abs Immature Granulocytes: 0.03 10*3/uL (ref 0.00–0.07)
Basophils Absolute: 0 10*3/uL (ref 0.0–0.1)
Basophils Relative: 0 %
Eosinophils Absolute: 0 10*3/uL (ref 0.0–0.5)
Eosinophils Relative: 0 %
HCT: 40.1 % (ref 36.0–46.0)
Hemoglobin: 13.5 g/dL (ref 12.0–15.0)
Immature Granulocytes: 0 %
Lymphocytes Relative: 12 %
Lymphs Abs: 1.2 10*3/uL (ref 0.7–4.0)
MCH: 28 pg (ref 26.0–34.0)
MCHC: 33.7 g/dL (ref 30.0–36.0)
MCV: 83.2 fL (ref 80.0–100.0)
Monocytes Absolute: 0.9 10*3/uL (ref 0.1–1.0)
Monocytes Relative: 10 %
Neutro Abs: 7.5 10*3/uL (ref 1.7–7.7)
Neutrophils Relative %: 78 %
Platelet Count: 336 10*3/uL (ref 150–400)
RBC: 4.82 MIL/uL (ref 3.87–5.11)
RDW: 13.9 % (ref 11.5–15.5)
WBC Count: 9.6 10*3/uL (ref 4.0–10.5)
nRBC: 0 % (ref 0.0–0.2)

## 2022-06-27 LAB — CMP (CANCER CENTER ONLY)
ALT: 34 U/L (ref 0–44)
AST: 38 U/L (ref 15–41)
Albumin: 4.6 g/dL (ref 3.5–5.0)
Alkaline Phosphatase: 58 U/L (ref 38–126)
Anion gap: 8 (ref 5–15)
BUN: 15 mg/dL (ref 6–20)
CO2: 27 mmol/L (ref 22–32)
Calcium: 10.2 mg/dL (ref 8.9–10.3)
Chloride: 102 mmol/L (ref 98–111)
Creatinine: 0.75 mg/dL (ref 0.44–1.00)
GFR, Estimated: >60 mL/min (ref >60)
Glucose, Bld: 105 mg/dL — ABNORMAL HIGH (ref 70–99)
Potassium: 4.1 mmol/L (ref 3.5–5.1)
Sodium: 137 mmol/L (ref 135–145)
Total Bilirubin: 0.4 mg/dL (ref 0.3–1.2)
Total Protein: 8 g/dL (ref 6.5–8.1)

## 2022-06-27 LAB — VITAMIN D 25 HYDROXY (VIT D DEFICIENCY, FRACTURES): Vit D, 25-Hydroxy: 70.3 ng/mL (ref 30–100)

## 2022-06-27 MED ORDER — PALONOSETRON HCL INJECTION 0.25 MG/5ML
0.2500 mg | Freq: Once | INTRAVENOUS | Status: AC
  Administered 2022-06-27: 0.25 mg via INTRAVENOUS
  Filled 2022-06-27: qty 5

## 2022-06-27 MED ORDER — SODIUM CHLORIDE 0.9 % IV SOLN
10.0000 mg | Freq: Once | INTRAVENOUS | Status: AC
  Administered 2022-06-27: 10 mg via INTRAVENOUS
  Filled 2022-06-27: qty 10

## 2022-06-27 MED ORDER — SODIUM CHLORIDE 0.9 % IV SOLN: Freq: Once | INTRAVENOUS | Status: AC

## 2022-06-27 MED ORDER — SODIUM CHLORIDE 0.9 % IV SOLN
600.0000 mg/m2 | Freq: Once | INTRAVENOUS | Status: AC
  Administered 2022-06-27: 1300 mg via INTRAVENOUS
  Filled 2022-06-27: qty 65

## 2022-06-27 MED ORDER — SODIUM CHLORIDE 0.9 % IV SOLN
75.0000 mg/m2 | Freq: Once | INTRAVENOUS | Status: AC
  Administered 2022-06-27: 160 mg via INTRAVENOUS
  Filled 2022-06-27: qty 16

## 2022-06-27 NOTE — Progress Notes (Signed)
Cancer Center Cancer Follow up:    Isabel Broker, MD [address] [place] [address]   DIAGNOSIS:  Cancer Staging  Malignant neoplasm of upper-inner quadrant of left breast in female, estrogen receptor positive (HCC) Staging form: Breast, AJCC 8th Edition - Pathologic stage from 05/28/2022: Stage IA (pT1b, pN0, cM0, G2, ER+, PR-, HER2-) - Signed by Loa Socks, NP on 06/27/2022 Histologic grading system: 3 grade system   SUMMARY OF ONCOLOGIC HISTORY: Oncology History  Malignant neoplasm of upper-inner quadrant of left breast in female, estrogen receptor positive (HCC)  04/02/2022 Mammogram   In the left breast a possible asymmetry warrants further evaluation.  In the right breast, no findings suspicious for malignancy.  Further evaluation is suggested for possible asymmetry in the left breast.  Ultrasound breast showed indeterminate left breast focal asymmetry with possible distortion.   04/30/2022 Oncotype testing   Oncotype DX resulted at 36, distant recurrence risk at 9 years with antiestrogen alone of 24% and group average absolute chemo benefit of greater than 15%    Pathology Results   Left breast needle core biopsy showed invasive ductal carcinoma overall grade 2, DCIS intermediate to high-grade, prognostic showed ER 100% positive strong staining PR 0% negative, Ki-67 of 10% and group 5 HER2 negative   05/28/2022 Definitive Surgery   Left lumpectomy showed no evidence of residual malignancy, 2 axillary lymph nodes negative for carcinoma.  Original biopsy showed 7 mm invasive ductal carcinoma.   05/28/2022 Cancer Staging   Staging form: Breast, AJCC 8th Edition - Pathologic stage from 05/28/2022: Stage IA (pT1b, pN0, cM0, G2, ER+, PR-, HER2-) - Signed by Loa Socks, NP on 06/27/2022 Histologic grading system: 3 grade system   06/27/2022 -  Chemotherapy   Patient is on Treatment Plan : BREAST TC q21d       CURRENT THERAPY:  Taxotere/Cytoxan  INTERVAL HISTORY: Isabel [name] 62 y.o. female returns for follow-up prior to receiving her first cycle of Taxotere and Cytoxan.  She has already obtained her antinausea medication and started dexamethasone yesterday.  She brought white plastic bags for ice in order to practice cryotherapy on her hands and feet to prevent peripheral neuropathy.  She denies any questions or concerns today.  She tells me that her lumpectomy site is healing well.   Patient Active Problem List   Diagnosis Date Noted   Malignant neoplasm of upper-inner quadrant of left breast in female, estrogen receptor positive (HCC) 05/13/2022   Pre-diabetes 04/01/2022   Routine general medical examination at a health care facility 09/29/2014   Obesity (BMI 35.0-39.9 without comorbidity) 04/18/2009    has No Known Allergies.  MEDICAL HISTORY: Past Medical History:  Diagnosis Date   Breast cancer (HCC) 05/01/2022   GERD (gastroesophageal reflux disease)    HYPERGLYCEMIA    OBESITY    Pneumonia    Pre-diabetes     SURGICAL HISTORY: Past Surgical History:  Procedure Laterality Date   AXILLARY SENTINEL NODE BIOPSY Left 05/28/2022   Procedure: LEFT AXILLARY SENTINEL NODE BIOPSY;  Surgeon: Manus Rudd, MD;  Location: [facility];  Service: General;  Laterality: Left;   BREAST BIOPSY Left 04/30/2022   MM LT BREAST BX W LOC DEV 1ST LESION IMAGE BX SPEC STEREO GUIDE 04/30/2022 [facility]   BREAST BIOPSY  05/27/2022   MM LT RADIOACTIVE SEED LOC MAMMO GUIDE 05/27/2022 [facility]   BREAST LUMPECTOMY WITH RADIOACTIVE SEED AND SENTINEL LYMPH NODE BIOPSY Left 05/28/2022   Procedure: LEFT BREAST  LUMPECTOMY WITH RADIOACTIVE SEED;  Surgeon: Manus Rudd, MD;  Location: [facility] OR;  Service: General;  Laterality: Left;   CESAREAN SECTION  1992   LAPAROSCOPIC CHOLECYSTECTOMY  2005    SOCIAL HISTORY: Social History   Socioeconomic History   Marital status: Married    Spouse name: Not on file   Number  of children: Not on file   Years of education: Not on file   Highest education level: Not on file  Occupational History   Not on file  Tobacco Use   Smoking status: Never   Smokeless tobacco: Never   Tobacco comments:    Married, lives with spouse. works at [employer], [employer] Use: Never used  Substance and Sexual Activity   Alcohol use: Not Currently    Alcohol/week: 2.0 standard drinks of alcohol    Types: 2 Glasses of wine per week   Drug use: No   Sexual activity: Not on file  Other Topics Concern   Not on file  Social History Narrative   Not on file   Social Determinants of Health   Financial Resource Strain: Not on file  Food Insecurity: Not on file  Transportation Needs: Not on file  Physical Activity: Not on file  Stress: Not on file  Social Connections: Not on file  Intimate Partner Violence: Not on file    FAMILY HISTORY: Family History  Problem Relation Age of Onset   Prostate cancer Father    Breast cancer Maternal Aunt 55   Stroke Maternal Grandmother    Hypertension Brother    Kidney disease Brother    Colon cancer Neg Hx    Stomach cancer Neg Hx     Review of Systems  Constitutional:  Negative for appetite change, chills, fatigue, fever and unexpected weight change.  HENT:   Negative for hearing loss, lump/mass and trouble swallowing.   Eyes:  Negative for eye problems and icterus.  Respiratory:  Negative for chest tightness, cough and shortness of breath.   Cardiovascular:  Negative for chest pain, leg swelling and palpitations.  Gastrointestinal:  Negative for abdominal distention, abdominal pain, constipation, diarrhea, nausea and vomiting.  Endocrine: Negative for hot flashes.  Genitourinary:  Negative for difficulty urinating.   Musculoskeletal:  Negative for arthralgias.  Skin:  Negative for itching and rash.  Neurological:  Negative for dizziness, extremity weakness, headaches and numbness.   Hematological:  Negative for adenopathy. Does not bruise/bleed easily.  Psychiatric/Behavioral:  Negative for depression. The patient is not nervous/anxious.       PHYSICAL EXAMINATION    Vitals:   06/27/22 1042  BP: (!) 154/62  Pulse: 92  Resp: 18  Temp: 98.1 F (36.7 C)  SpO2: 100%    Physical Exam Constitutional:      General: She is not in acute distress.    Appearance: Normal appearance. She is not toxic-appearing.  HENT:     Head: Normocephalic and atraumatic.     Mouth/Throat:     Mouth: Mucous membranes are moist.     Pharynx: Oropharynx is clear. No oropharyngeal exudate or posterior oropharyngeal erythema.  Eyes:     General: No scleral icterus. Cardiovascular:     Rate and Rhythm: Normal rate and regular rhythm.     Pulses: Normal pulses.     Heart sounds: Normal heart sounds.  Pulmonary:     Effort: Pulmonary effort is normal.     Breath sounds: Normal breath sounds.  Chest:  Comments: Left breast s/p lumpectomy and LN biopsy, incision sites are healing well.  No sign of infection, swelling, or other concerns. Abdominal:     General: Abdomen is flat. Bowel sounds are normal. There is no distension.     Palpations: Abdomen is soft.     Tenderness: There is no abdominal tenderness.  Musculoskeletal:        General: No swelling.     Cervical back: Neck supple.  Lymphadenopathy:     Cervical: No cervical adenopathy.  Skin:    General: Skin is warm and dry.     Findings: No rash.  Neurological:     General: No focal deficit present.     Mental Status: She is alert.  Psychiatric:        Mood and Affect: Mood normal.        Behavior: Behavior normal.     LABORATORY DATA:  CBC    Component Value Date/Time   WBC 9.6 06/27/2022 1013   WBC 5.3 05/23/2022 0930   RBC 4.82 06/27/2022 1013   HGB 13.5 06/27/2022 1013   HCT 40.1 06/27/2022 1013   PLT 336 06/27/2022 1013   MCV 83.2 06/27/2022 1013   MCH 28.0 06/27/2022 1013   MCHC 33.7  06/27/2022 1013   RDW 13.9 06/27/2022 1013   LYMPHSABS 1.2 06/27/2022 1013   MONOABS 0.9 06/27/2022 1013   EOSABS 0.0 06/27/2022 1013   BASOSABS 0.0 06/27/2022 1013    CMP     Component Value Date/Time   NA 137 06/27/2022 1013   K 4.1 06/27/2022 1013   CL 102 06/27/2022 1013   CO2 27 06/27/2022 1013   GLUCOSE 105 (H) 06/27/2022 1013   BUN 15 06/27/2022 1013   CREATININE 0.75 06/27/2022 1013   CALCIUM 10.2 06/27/2022 1013   PROT 8.0 06/27/2022 1013   ALBUMIN 4.6 06/27/2022 1013   AST 38 06/27/2022 1013   ALT 34 06/27/2022 1013   ALKPHOS 58 06/27/2022 1013   BILITOT 0.4 06/27/2022 1013   GFRNONAA >60 06/27/2022 1013      ASSESSMENT and THERAPY PLAN:   Malignant neoplasm of upper-inner quadrant of left breast in female, estrogen receptor positive (HCC) Lorrayne is a 62 year old woman with stage IA ER+ breast cancer s/p lumpectomy and here for adjuvant chemotherapy with Taxotere/Cytoxan.    Stage IA ER+ breast cancer: She will begin treatment today with Taxotere and Cytoxan given adjuvantly.  She verbalized understanding of what this entails.  Her labs are stable today and I reviewed them with her in detail. I signed her treatment plan and sent request to scheduling for follow-up in [redacted] week along with getting the rest of her appointments scheduled.  Olegario Messier will return in 2 days for an injection and in 1 week for labs and follow-up.  She knows to call for any questions or concerns that may arise between now and her next appointment with [place].   All questions were answered. The patient knows to call the clinic with any problems, questions or concerns. We can certainly see the patient much sooner if necessary.  Total encounter time:30 minutes*in face-to-face visit time, chart review, lab review, care coordination, order entry, and documentation of the encounter time.    Lillard Anes, NP 06/27/22 11:49 AM Medical Oncology and Hematology [facility] [address]  [place], [place] [address] Tel. [phone]    Fax. [phone]  *Total Encounter Time as defined by the Centers for Medicare and Medicaid Services includes, in  addition to the face-to-face time of a patient visit (documented in the note above) non-face-to-face time: obtaining and reviewing outside history, ordering and reviewing medications, tests or procedures, care coordination (communications with other health care professionals or caregivers) and documentation in the medical record.

## 2022-06-27 NOTE — Patient Instructions (Signed)
[facility]  Discharge Instructions: Thank you for choosing [facility] to provide your oncology and hematology care.   If you have a lab appointment with the Cancer Center, please go directly to the Cancer Center and check in at the registration area.   Wear comfortable clothing and clothing appropriate for easy access to any Portacath or PICC line.   We strive to give you quality time with your provider. You may need to reschedule your appointment if you arrive late (15 or more minutes).  Arriving late affects you and other patients whose appointments are after yours.  Also, if you miss three or more appointments without notifying the office, you may be dismissed from the clinic at the provider's discretion.      For prescription refill requests, have your pharmacy contact our office and allow 72 hours for refills to be completed.    Today you received the following chemotherapy and/or immunotherapy agents :Taxotere, Cytoxan      To help prevent nausea and vomiting after your treatment, we encourage you to take your nausea medication as directed.  BELOW ARE SYMPTOMS THAT SHOULD BE REPORTED IMMEDIATELY: *FEVER GREATER THAN 100.4 F (38 C) OR HIGHER *CHILLS OR SWEATING *NAUSEA AND VOMITING THAT IS NOT CONTROLLED WITH YOUR NAUSEA MEDICATION *UNUSUAL SHORTNESS OF BREATH *UNUSUAL BRUISING OR BLEEDING *URINARY PROBLEMS (pain or burning when urinating, or frequent urination) *BOWEL PROBLEMS (unusual diarrhea, constipation, pain near the anus) TENDERNESS IN MOUTH AND THROAT WITH OR WITHOUT PRESENCE OF ULCERS (sore throat, sores in mouth, or a toothache) UNUSUAL RASH, SWELLING OR PAIN  UNUSUAL VAGINAL DISCHARGE OR ITCHING   Items with * indicate a potential emergency and should be followed up as soon as possible or go to the Emergency Department if any problems should occur.  Please show the CHEMOTHERAPY ALERT CARD or IMMUNOTHERAPY ALERT CARD  at check-in to the Emergency Department and triage nurse.  Should you have questions after your visit or need to cancel or reschedule your appointment, please contact [facility]  Dept: [phone]  and follow the prompts.  Office hours are 8:00 a.m. to 4:30 p.m. Monday - Friday. Please note that voicemails left after 4:00 p.m. may not be returned until the following business day.  We are closed weekends and major holidays. You have access to a nurse at all times for urgent questions. Please call the main number to the clinic Dept: [phone] and follow the prompts.   For any non-urgent questions, you may also contact your provider using MyChart. We now offer e-Visits for anyone 18 and older to request care online for non-urgent symptoms. For details visit [email or web address].   Also download the MyChart app! Go to the app store, search "MyChart", open the app, select [facility], and log in with your MyChart username and password.   

## 2022-06-27 NOTE — Assessment & Plan Note (Signed)
Isabel [name] is a 62 year old woman with stage IA ER+ breast cancer s/p lumpectomy and here for adjuvant chemotherapy with Taxotere/Cytoxan.    Stage IA ER+ breast cancer: She will begin treatment today with Taxotere and Cytoxan given adjuvantly.  She verbalized understanding of what this entails.  Her labs are stable today and I reviewed them with her in detail. I signed her treatment plan and sent request to scheduling for follow-up in [redacted] week along with getting the rest of her appointments scheduled.  Isabel [name] will return in 2 days for an injection and in 1 week for labs and follow-up.  She knows to call for any questions or concerns that may arise between now and her next appointment with [place].

## 2022-06-28 ENCOUNTER — Telehealth: Payer: Self-pay | Admitting: *Deleted

## 2022-06-28 ENCOUNTER — Encounter: Payer: Self-pay | Admitting: Hematology and Oncology

## 2022-06-28 NOTE — Telephone Encounter (Signed)
-----   Message from Jason A Eubanks, RN sent at 06/27/2022  3:30 PM EDT ----- Regarding: first time treatment call back- iruku taxotere/ cytoxan Patient had treatment today for the first time. She is follow by Dr. Iruku. She received taxotere, and cytoxan today with no issues!! :)   

## 2022-06-28 NOTE — Telephone Encounter (Signed)
-----   Message from Coralee Rud, RN sent at 06/27/2022  3:30 PM EDT ----- Regarding: first time treatment call back- iruku taxotere/ cytoxan Patient had treatment today for the first time. She is follow by Dr. Al Pimple. She received taxotere, and cytoxan today with no issues!! :)

## 2022-06-29 ENCOUNTER — Other Ambulatory Visit: Payer: Self-pay

## 2022-06-29 ENCOUNTER — Inpatient Hospital Stay

## 2022-06-29 VITALS — BP 148/78 | HR 78 | Temp 97.8°F | Resp 18 | Ht 66.0 in

## 2022-06-29 DIAGNOSIS — Z170 Estrogen receptor positive status [ER+]: Secondary | ICD-10-CM

## 2022-06-29 DIAGNOSIS — Z5111 Encounter for antineoplastic chemotherapy: Secondary | ICD-10-CM | POA: Diagnosis not present

## 2022-06-29 MED ORDER — PEGFILGRASTIM-CBQV 6 MG/0.6ML ~~LOC~~ SOSY
6.0000 mg | PREFILLED_SYRINGE | Freq: Once | SUBCUTANEOUS | Status: AC
  Administered 2022-06-29: 6 mg via SUBCUTANEOUS

## 2022-07-02 ENCOUNTER — Other Ambulatory Visit

## 2022-07-02 ENCOUNTER — Other Ambulatory Visit: Payer: Self-pay | Admitting: *Deleted

## 2022-07-02 ENCOUNTER — Ambulatory Visit

## 2022-07-02 ENCOUNTER — Encounter: Admitting: Genetic Counselor

## 2022-07-02 DIAGNOSIS — C50212 Malignant neoplasm of upper-inner quadrant of left female breast: Secondary | ICD-10-CM

## 2022-07-03 ENCOUNTER — Inpatient Hospital Stay

## 2022-07-03 ENCOUNTER — Inpatient Hospital Stay (HOSPITAL_BASED_OUTPATIENT_CLINIC_OR_DEPARTMENT_OTHER): Admitting: Adult Health

## 2022-07-03 ENCOUNTER — Encounter: Payer: Self-pay | Admitting: Hematology and Oncology

## 2022-07-03 ENCOUNTER — Encounter: Payer: Self-pay | Admitting: Adult Health

## 2022-07-03 VITALS — BP 154/63 | HR 108 | Temp 98.0°F | Resp 18 | Ht 66.0 in | Wt 216.8 lb

## 2022-07-03 DIAGNOSIS — Z5111 Encounter for antineoplastic chemotherapy: Secondary | ICD-10-CM | POA: Diagnosis not present

## 2022-07-03 DIAGNOSIS — Z170 Estrogen receptor positive status [ER+]: Secondary | ICD-10-CM

## 2022-07-03 DIAGNOSIS — C50212 Malignant neoplasm of upper-inner quadrant of left female breast: Secondary | ICD-10-CM

## 2022-07-03 LAB — CMP (CANCER CENTER ONLY)
ALT: 42 U/L (ref 0–44)
AST: 27 U/L (ref 15–41)
Albumin: 4.4 g/dL (ref 3.5–5.0)
Alkaline Phosphatase: 75 U/L (ref 38–126)
Anion gap: 6 (ref 5–15)
BUN: 7 mg/dL (ref 6–20)
CO2: 29 mmol/L (ref 22–32)
Calcium: 9.7 mg/dL (ref 8.9–10.3)
Chloride: 102 mmol/L (ref 98–111)
Creatinine: 0.73 mg/dL (ref 0.44–1.00)
GFR, Estimated: >60 mL/min (ref >60)
Glucose, Bld: 111 mg/dL — ABNORMAL HIGH (ref 70–99)
Potassium: 4.1 mmol/L (ref 3.5–5.1)
Sodium: 137 mmol/L (ref 135–145)
Total Bilirubin: 0.4 mg/dL (ref 0.3–1.2)
Total Protein: 7.8 g/dL (ref 6.5–8.1)

## 2022-07-03 LAB — CBC WITH DIFFERENTIAL (CANCER CENTER ONLY)
Abs Immature Granulocytes: 0.04 10*3/uL (ref 0.00–0.07)
Basophils Absolute: 0.1 10*3/uL (ref 0.0–0.1)
Basophils Relative: 3 %
Eosinophils Absolute: 0.2 10*3/uL (ref 0.0–0.5)
Eosinophils Relative: 8 %
HCT: 41.4 % (ref 36.0–46.0)
Hemoglobin: 13.8 g/dL (ref 12.0–15.0)
Immature Granulocytes: 2 %
Lymphocytes Relative: 35 %
Lymphs Abs: 0.9 10*3/uL (ref 0.7–4.0)
MCH: 27.9 pg (ref 26.0–34.0)
MCHC: 33.3 g/dL (ref 30.0–36.0)
MCV: 83.8 fL (ref 80.0–100.0)
Monocytes Absolute: 0.3 10*3/uL (ref 0.1–1.0)
Monocytes Relative: 10 %
Neutro Abs: 1.1 10*3/uL — ABNORMAL LOW (ref 1.7–7.7)
Neutrophils Relative %: 42 %
Platelet Count: 210 10*3/uL (ref 150–400)
RBC: 4.94 MIL/uL (ref 3.87–5.11)
RDW: 14 % (ref 11.5–15.5)
WBC Count: 2.5 10*3/uL — ABNORMAL LOW (ref 4.0–10.5)
nRBC: 0 % (ref 0.0–0.2)

## 2022-07-03 NOTE — Progress Notes (Signed)
[facility] Cancer Follow up:    Isabel Broker, MD [address] [place] [address]   DIAGNOSIS:  Cancer Staging  Malignant neoplasm of upper-inner quadrant of left breast in female, estrogen receptor positive (HCC) Staging form: Breast, AJCC 8th Edition - Pathologic stage from 05/28/2022: Stage IA (pT1b, pN0, cM0, G2, ER+, PR-, HER2-) - Signed by Loa Socks, NP on 06/27/2022 Histologic grading system: 3 grade system   SUMMARY OF ONCOLOGIC HISTORY: Oncology History  Malignant neoplasm of upper-inner quadrant of left breast in female, estrogen receptor positive (HCC)  04/02/2022 Mammogram   In the left breast a possible asymmetry warrants further evaluation.  In the right breast, no findings suspicious for malignancy.  Further evaluation is suggested for possible asymmetry in the left breast.  Ultrasound breast showed indeterminate left breast focal asymmetry with possible distortion.    Pathology Results   Left breast needle core biopsy showed invasive ductal carcinoma overall grade 2, DCIS intermediate to high-grade, prognostic showed ER 100% positive strong staining PR 0% negative, Ki-67 of 10% and group 5 HER2 negative   05/28/2022 Definitive Surgery   Left lumpectomy showed no evidence of residual malignancy, 2 axillary lymph nodes negative for carcinoma.  Original biopsy showed 7 mm invasive ductal carcinoma.   05/28/2022 Cancer Staging   Staging form: Breast, AJCC 8th Edition - Pathologic stage from 05/28/2022: Stage IA (pT1b, pN0, cM0, G2, ER+, PR-, HER2-) - Signed by Loa Socks, NP on 06/27/2022 Histologic grading system: 3 grade system   06/06/2022 Oncotype testing   36/24%, >15% absolute chemotherapy benefit   06/27/2022 -  Chemotherapy   Patient is on Treatment Plan : BREAST TC q21d       CURRENT THERAPY: Taxotere/Cytoxan  INTERVAL HISTORY: Isabel [name] 62 y.o. female returns for follow-up and evaluation after  receiving her first cycle of Taxotere and Cytoxan.  She is tolerated it quite well.  She has mild fatigue but this is not impacting her daily activities at this point.  She performed cryotherapy during her Taxotere treatment and she denies any peripheral neuropathy today.  She has not had any taste changes and tells me that she is getting in walking 2 miles a day.  Her only issue has been some mild gas that she thinks is manageable.   Patient Active Problem List   Diagnosis Date Noted   Malignant neoplasm of upper-inner quadrant of left breast in female, estrogen receptor positive (HCC) 05/13/2022   Pre-diabetes 04/01/2022   Routine general medical examination at a health care facility 09/29/2014   Obesity (BMI 35.0-39.9 without comorbidity) 04/18/2009    has No Known Allergies.  MEDICAL HISTORY: Past Medical History:  Diagnosis Date   Breast cancer (HCC) 05/01/2022   GERD (gastroesophageal reflux disease)    HYPERGLYCEMIA    OBESITY    Pre-diabetes     SURGICAL HISTORY: Past Surgical History:  Procedure Laterality Date   AXILLARY SENTINEL NODE BIOPSY Left 05/28/2022   Procedure: LEFT AXILLARY SENTINEL NODE BIOPSY;  Surgeon: Manus Rudd, MD;  Location: [facility] OR;  Service: General;  Laterality: Left;   BREAST BIOPSY Left 04/30/2022   MM LT BREAST BX W LOC DEV 1ST LESION IMAGE BX SPEC STEREO GUIDE 04/30/2022 [facility]   BREAST BIOPSY  05/27/2022   MM LT RADIOACTIVE SEED LOC MAMMO GUIDE 05/27/2022 [facility]   BREAST LUMPECTOMY WITH RADIOACTIVE SEED AND SENTINEL LYMPH NODE BIOPSY Left 05/28/2022   Procedure: LEFT BREAST LUMPECTOMY WITH RADIOACTIVE SEED;  Surgeon: Manus Rudd, MD;  Location: [facility] OR;  Service: General;  Laterality: Left;   CESAREAN SECTION  1992   LAPAROSCOPIC CHOLECYSTECTOMY  2005    SOCIAL HISTORY: Social History   Socioeconomic History   Marital status: Married    Spouse name: Not on file   Number of children: Not on file   Years of education:  Not on file   Highest education level: Not on file  Occupational History   Not on file  Tobacco Use   Smoking status: Never   Smokeless tobacco: Never   Tobacco comments:    Married, lives with spouse. works at [employer] Use: Never used  Substance and Sexual Activity   Alcohol use: Not Currently    Alcohol/week: 2.0 standard drinks of alcohol    Types: 2 Glasses of wine per week   Drug use: No   Sexual activity: Not on file  Other Topics Concern   Not on file  Social History Narrative   Not on file   Social Determinants of Health   Financial Resource Strain: Not on file  Food Insecurity: Not on file  Transportation Needs: Not on file  Physical Activity: Not on file  Stress: Not on file  Social Connections: Not on file  Intimate Partner Violence: Not on file    FAMILY HISTORY: Family History  Problem Relation Age of Onset   Prostate cancer Father    Breast cancer Maternal Aunt 55   Stroke Maternal Grandmother    Hypertension Brother    Kidney disease Brother    Colon cancer Neg Hx    Stomach cancer Neg Hx     Review of Systems  Constitutional:  Positive for fatigue. Negative for appetite change, chills, fever and unexpected weight change.  HENT:   Negative for hearing loss, lump/mass and trouble swallowing.   Eyes:  Negative for eye problems and icterus.  Respiratory:  Negative for chest tightness, cough and shortness of breath.   Cardiovascular:  Negative for chest pain, leg swelling and palpitations.  Gastrointestinal:  Negative for abdominal distention, abdominal pain, constipation, diarrhea, nausea and vomiting.  Endocrine: Negative for hot flashes.  Genitourinary:  Negative for difficulty urinating.   Musculoskeletal:  Negative for arthralgias.  Skin:  Negative for itching and rash.  Neurological:  Negative for dizziness, extremity weakness, headaches and numbness.  Hematological:  Negative for adenopathy. Does  not bruise/bleed easily.  Psychiatric/Behavioral:  Negative for depression. The patient is not nervous/anxious.       PHYSICAL EXAMINATION   Onc Performance Status - 07/03/22 0843       ECOG Perf Status   ECOG Perf Status Fully active, able to carry on all pre-disease performance without restriction      KPS SCALE   KPS % SCORE Normal, no compliants, no evidence of disease             Vitals:   07/03/22 0839  BP: (!) 154/63  Pulse: (!) 108  Resp: 18  Temp: 98 F (36.7 C)  SpO2: 100%    Physical Exam Constitutional:      General: She is not in acute distress.    Appearance: Normal appearance. She is not toxic-appearing.  HENT:     Head: Normocephalic and atraumatic.     Mouth/Throat:     Mouth: Mucous membranes are moist.     Pharynx: Oropharynx is clear. No oropharyngeal exudate or posterior oropharyngeal erythema.  Eyes:  General: No scleral icterus. Cardiovascular:     Rate and Rhythm: Normal rate and regular rhythm.     Pulses: Normal pulses.     Heart sounds: Normal heart sounds.  Pulmonary:     Effort: Pulmonary effort is normal.     Breath sounds: Normal breath sounds.  Abdominal:     General: Abdomen is flat. Bowel sounds are normal. There is no distension.     Palpations: Abdomen is soft.     Tenderness: There is no abdominal tenderness.  Musculoskeletal:        General: No swelling.     Cervical back: Neck supple.  Lymphadenopathy:     Cervical: No cervical adenopathy.  Skin:    General: Skin is warm and dry.     Findings: No rash.  Neurological:     General: No focal deficit present.     Mental Status: She is alert.  Psychiatric:        Mood and Affect: Mood normal.        Behavior: Behavior normal.     LABORATORY DATA:  CBC    Component Value Date/Time   WBC 2.5 (L) 07/03/2022 0756   WBC 5.3 05/23/2022 0930   RBC 4.94 07/03/2022 0756   HGB 13.8 07/03/2022 0756   HCT 41.4 07/03/2022 0756   PLT 210 07/03/2022 0756   MCV  83.8 07/03/2022 0756   MCH 27.9 07/03/2022 0756   MCHC 33.3 07/03/2022 0756   RDW 14.0 07/03/2022 0756   LYMPHSABS 0.9 07/03/2022 0756   MONOABS 0.3 07/03/2022 0756   EOSABS 0.2 07/03/2022 0756   BASOSABS 0.1 07/03/2022 0756    CMP     Component Value Date/Time   NA 137 07/03/2022 0756   K 4.1 07/03/2022 0756   CL 102 07/03/2022 0756   CO2 29 07/03/2022 0756   GLUCOSE 111 (H) 07/03/2022 0756   BUN 7 07/03/2022 0756   CREATININE 0.73 07/03/2022 0756   CALCIUM 9.7 07/03/2022 0756   PROT 7.8 07/03/2022 0756   ALBUMIN 4.4 07/03/2022 0756   AST 27 07/03/2022 0756   ALT 42 07/03/2022 0756   ALKPHOS 75 07/03/2022 0756   BILITOT 0.4 07/03/2022 0756   GFRNONAA >60 07/03/2022 0756         ASSESSMENT and THERAPY PLAN:   Malignant neoplasm of upper-inner quadrant of left breast in female, estrogen receptor positive (HCC) Deshaundra is a 62 year old woman with stage IA ER+ breast cancer s/p lumpectomy and here for adjuvant chemotherapy with Taxotere/Cytoxan.    Treatment plan: Breast conserving surgery with SLN biopsy Oncotype Adjuvant chemotherapy Adjuvant radiation therapy Adjuvant antiestrogen therapy  Current Treatment: Taxotere/Cytoxan cycle 1 day 8  Toxicities:  Fatigue: managing with energy conservation Gas: tolerable and manageable  Her labs are stable today and she tolerated treatment quite well.  She will return in 2 weeks for labs, follow-up with Dr. Al Pimple, and her second cycle of Taxotere and Cytoxan.    All questions were answered. The patient knows to call the clinic with any problems, questions or concerns. We can certainly see the patient much sooner if necessary.  Total encounter time:30 minutes*in face-to-face visit time, chart review, lab review, care coordination, order entry, and documentation of the encounter time.    Lillard Anes, NP 07/04/22 2:07 PM Medical Oncology and Hematology [facility] [address] [place], [place]  [address] Tel. [phone]    Fax. [phone]  *Total Encounter Time as defined by the Centers for  Medicare and Medicaid Services includes, in addition to the face-to-face time of a patient visit (documented in the note above) non-face-to-face time: obtaining and reviewing outside history, ordering and reviewing medications, tests or procedures, care coordination (communications with other health care professionals or caregivers) and documentation in the medical record.

## 2022-07-03 NOTE — Assessment & Plan Note (Addendum)
Isabel [name] is a 62 year old woman with stage IA ER+ breast cancer s/p lumpectomy and here for adjuvant chemotherapy with Taxotere/Cytoxan.    Treatment plan: Breast conserving surgery with SLN biopsy Oncotype Adjuvant chemotherapy Adjuvant radiation therapy Adjuvant antiestrogen therapy  Current Treatment: Taxotere/Cytoxan cycle 1 day 8  Toxicities:  Fatigue: managing with energy conservation Gas: tolerable and manageable  Her labs are stable today and she tolerated treatment quite well.  She will return in 2 weeks for labs, follow-up with Dr. Al Pimple, and her second cycle of Taxotere and Cytoxan.

## 2022-07-04 ENCOUNTER — Encounter: Payer: Self-pay | Admitting: *Deleted

## 2022-07-05 ENCOUNTER — Inpatient Hospital Stay: Admitting: Hematology and Oncology

## 2022-07-05 ENCOUNTER — Encounter: Payer: Self-pay | Admitting: Hematology and Oncology

## 2022-07-05 ENCOUNTER — Telehealth: Payer: Self-pay | Admitting: Adult Health

## 2022-07-05 NOTE — Telephone Encounter (Signed)
Scheduled appointments per WQ. Patient is aware of the made appointments.  

## 2022-07-18 ENCOUNTER — Inpatient Hospital Stay

## 2022-07-18 ENCOUNTER — Inpatient Hospital Stay (HOSPITAL_BASED_OUTPATIENT_CLINIC_OR_DEPARTMENT_OTHER): Admitting: Hematology and Oncology

## 2022-07-18 ENCOUNTER — Other Ambulatory Visit: Payer: Self-pay

## 2022-07-18 ENCOUNTER — Inpatient Hospital Stay: Attending: Hematology and Oncology

## 2022-07-18 ENCOUNTER — Encounter: Payer: Self-pay | Admitting: Hematology and Oncology

## 2022-07-18 VITALS — BP 145/63 | HR 79 | Temp 98.2°F | Resp 16

## 2022-07-18 DIAGNOSIS — Z5111 Encounter for antineoplastic chemotherapy: Secondary | ICD-10-CM | POA: Diagnosis present

## 2022-07-18 DIAGNOSIS — Z8042 Family history of malignant neoplasm of prostate: Secondary | ICD-10-CM | POA: Diagnosis not present

## 2022-07-18 DIAGNOSIS — Z170 Estrogen receptor positive status [ER+]: Secondary | ICD-10-CM | POA: Diagnosis not present

## 2022-07-18 DIAGNOSIS — K219 Gastro-esophageal reflux disease without esophagitis: Secondary | ICD-10-CM | POA: Diagnosis not present

## 2022-07-18 DIAGNOSIS — Z803 Family history of malignant neoplasm of breast: Secondary | ICD-10-CM | POA: Diagnosis not present

## 2022-07-18 DIAGNOSIS — E785 Hyperlipidemia, unspecified: Secondary | ICD-10-CM | POA: Diagnosis not present

## 2022-07-18 DIAGNOSIS — C50212 Malignant neoplasm of upper-inner quadrant of left female breast: Secondary | ICD-10-CM | POA: Diagnosis not present

## 2022-07-18 DIAGNOSIS — E669 Obesity, unspecified: Secondary | ICD-10-CM | POA: Insufficient documentation

## 2022-07-18 LAB — CBC WITH DIFFERENTIAL (CANCER CENTER ONLY)
Abs Immature Granulocytes: 0.09 10*3/uL — ABNORMAL HIGH (ref 0.00–0.07)
Basophils Absolute: 0 10*3/uL (ref 0.0–0.1)
Basophils Relative: 0 %
Eosinophils Absolute: 0 10*3/uL (ref 0.0–0.5)
Eosinophils Relative: 0 %
HCT: 39.6 % (ref 36.0–46.0)
Hemoglobin: 13.4 g/dL (ref 12.0–15.0)
Immature Granulocytes: 1 %
Lymphocytes Relative: 6 %
Lymphs Abs: 0.7 10*3/uL (ref 0.7–4.0)
MCH: 27.7 pg (ref 26.0–34.0)
MCHC: 33.8 g/dL (ref 30.0–36.0)
MCV: 82 fL (ref 80.0–100.0)
Monocytes Absolute: 0.5 10*3/uL (ref 0.1–1.0)
Monocytes Relative: 4 %
Neutro Abs: 10.8 10*3/uL — ABNORMAL HIGH (ref 1.7–7.7)
Neutrophils Relative %: 89 %
Platelet Count: 307 10*3/uL (ref 150–400)
RBC: 4.83 MIL/uL (ref 3.87–5.11)
RDW: 14.2 % (ref 11.5–15.5)
WBC Count: 12.1 10*3/uL — ABNORMAL HIGH (ref 4.0–10.5)
nRBC: 0 % (ref 0.0–0.2)

## 2022-07-18 LAB — CMP (CANCER CENTER ONLY)
ALT: 28 U/L (ref 0–44)
AST: 23 U/L (ref 15–41)
Albumin: 4.3 g/dL (ref 3.5–5.0)
Alkaline Phosphatase: 62 U/L (ref 38–126)
Anion gap: 10 (ref 5–15)
BUN: 13 mg/dL (ref 6–20)
CO2: 23 mmol/L (ref 22–32)
Calcium: 10.2 mg/dL (ref 8.9–10.3)
Chloride: 105 mmol/L (ref 98–111)
Creatinine: 0.66 mg/dL (ref 0.44–1.00)
GFR, Estimated: >60 mL/min (ref >60)
Glucose, Bld: 141 mg/dL — ABNORMAL HIGH (ref 70–99)
Potassium: 3.7 mmol/L (ref 3.5–5.1)
Sodium: 138 mmol/L (ref 135–145)
Total Bilirubin: 0.3 mg/dL (ref 0.3–1.2)
Total Protein: 8.1 g/dL (ref 6.5–8.1)

## 2022-07-18 MED ORDER — SODIUM CHLORIDE 0.9 % IV SOLN
10.0000 mg | Freq: Once | INTRAVENOUS | Status: AC
  Administered 2022-07-18: 10 mg via INTRAVENOUS
  Filled 2022-07-18: qty 10

## 2022-07-18 MED ORDER — SODIUM CHLORIDE 0.9 % IV SOLN
600.0000 mg/m2 | Freq: Once | INTRAVENOUS | Status: AC
  Administered 2022-07-18: 1300 mg via INTRAVENOUS
  Filled 2022-07-18: qty 65

## 2022-07-18 MED ORDER — SODIUM CHLORIDE 0.9 % IV SOLN: Freq: Once | INTRAVENOUS | Status: AC

## 2022-07-18 MED ORDER — PALONOSETRON HCL INJECTION 0.25 MG/5ML
0.2500 mg | Freq: Once | INTRAVENOUS | Status: AC
  Administered 2022-07-18: 0.25 mg via INTRAVENOUS
  Filled 2022-07-18: qty 5

## 2022-07-18 MED ORDER — SODIUM CHLORIDE 0.9 % IV SOLN
75.0000 mg/m2 | Freq: Once | INTRAVENOUS | Status: AC
  Administered 2022-07-18: 160 mg via INTRAVENOUS
  Filled 2022-07-18: qty 16

## 2022-07-18 NOTE — Progress Notes (Signed)
[facility] Cancer Follow up:    Isabel Broker, MD [address] [place] [address]   DIAGNOSIS:  Cancer Staging  Malignant neoplasm of upper-inner quadrant of left breast in female, estrogen receptor positive (HCC) Staging form: Breast, AJCC 8th Edition - Pathologic stage from 05/28/2022: Stage IA (pT1b, pN0, cM0, G2, ER+, PR-, HER2-) - Signed by Loa Socks, NP on 06/27/2022 Histologic grading system: 3 grade system   SUMMARY OF ONCOLOGIC HISTORY: Oncology History  Malignant neoplasm of upper-inner quadrant of left breast in female, estrogen receptor positive (HCC)  04/02/2022 Mammogram   In the left breast a possible asymmetry warrants further evaluation.  In the right breast, no findings suspicious for malignancy.  Further evaluation is suggested for possible asymmetry in the left breast.  Ultrasound breast showed indeterminate left breast focal asymmetry with possible distortion.    Pathology Results   Left breast needle core biopsy showed invasive ductal carcinoma overall grade 2, DCIS intermediate to high-grade, prognostic showed ER 100% positive strong staining PR 0% negative, Ki-67 of 10% and group 5 HER2 negative   05/28/2022 Definitive Surgery   Left lumpectomy showed no evidence of residual malignancy, 2 axillary lymph nodes negative for carcinoma.  Original biopsy showed 7 mm invasive ductal carcinoma.   05/28/2022 Cancer Staging   Staging form: Breast, AJCC 8th Edition - Pathologic stage from 05/28/2022: Stage IA (pT1b, pN0, cM0, G2, ER+, PR-, HER2-) - Signed by Loa Socks, NP on 06/27/2022 Histologic grading system: 3 grade system   06/06/2022 Oncotype testing   36/24%, >15% absolute chemotherapy benefit   06/27/2022 -  Chemotherapy   Patient is on Treatment Plan : BREAST TC q21d       CURRENT THERAPY: Taxotere/Cytoxan  INTERVAL HISTORY: Isabel [name] 62 y.o. female returns for follow-up and evaluation before C2  TC.  She performed cryotherapy during her Taxotere treatment and she denies any peripheral neuropathy today.   She is doing very well today. She is eating healthy, exercising and denies any complaints at all. No nausea, vomiting or diarrhea. She needed a little medication for constipation. Rest of the pertinent 10 point ROS reviewed and neg.   Patient Active Problem List   Diagnosis Date Noted   Malignant neoplasm of upper-inner quadrant of left breast in female, estrogen receptor positive (HCC) 05/13/2022   Pre-diabetes 04/01/2022   Routine general medical examination at a health care facility 09/29/2014   Obesity (BMI 35.0-39.9 without comorbidity) 04/18/2009    has No Known Allergies.  MEDICAL HISTORY: Past Medical History:  Diagnosis Date   Breast cancer (HCC) 05/01/2022   GERD (gastroesophageal reflux disease)    HYPERGLYCEMIA    OBESITY    Pre-diabetes     SURGICAL HISTORY: Past Surgical History:  Procedure Laterality Date   AXILLARY SENTINEL NODE BIOPSY Left 05/28/2022   Procedure: LEFT AXILLARY SENTINEL NODE BIOPSY;  Surgeon: Manus Rudd, MD;  Location: [facility] OR;  Service: General;  Laterality: Left;   BREAST BIOPSY Left 04/30/2022   MM LT BREAST BX W LOC DEV 1ST LESION IMAGE BX SPEC STEREO GUIDE 04/30/2022 [facility]   BREAST BIOPSY  05/27/2022   MM LT RADIOACTIVE SEED LOC MAMMO GUIDE 05/27/2022 [facility]   BREAST LUMPECTOMY WITH RADIOACTIVE SEED AND SENTINEL LYMPH NODE BIOPSY Left 05/28/2022   Procedure: LEFT BREAST LUMPECTOMY WITH RADIOACTIVE SEED;  Surgeon: Manus Rudd, MD;  Location: [facility] OR;  Service: General;  Laterality: Left;   CESAREAN SECTION  1992  LAPAROSCOPIC CHOLECYSTECTOMY  2005    SOCIAL HISTORY: Social History   Socioeconomic History   Marital status: Married    Spouse name: Not on file   Number of children: Not on file   Years of education: Not on file   Highest education level: Not on file  Occupational History   Not on file   Tobacco Use   Smoking status: Never   Smokeless tobacco: Never   Tobacco comments:    Married, lives with spouse. works at [employer], [employer] Use: Never used  Substance and Sexual Activity   Alcohol use: Not Currently    Alcohol/week: 2.0 standard drinks of alcohol    Types: 2 Glasses of wine per week   Drug use: No   Sexual activity: Not on file  Other Topics Concern   Not on file  Social History Narrative   Not on file   Social Determinants of Health   Financial Resource Strain: Not on file  Food Insecurity: Not on file  Transportation Needs: Not on file  Physical Activity: Not on file  Stress: Not on file  Social Connections: Not on file  Intimate Partner Violence: Not on file    FAMILY HISTORY: Family History  Problem Relation Age of Onset   Prostate cancer Father    Breast cancer Maternal Aunt 55   Stroke Maternal Grandmother    Hypertension Brother    Kidney disease Brother    Colon cancer Neg Hx    Stomach cancer Neg Hx     Review of Systems  Constitutional:  Positive for fatigue. Negative for appetite change, chills, fever and unexpected weight change.  HENT:   Negative for hearing loss, lump/mass and trouble swallowing.   Eyes:  Negative for eye problems and icterus.  Respiratory:  Negative for chest tightness, cough and shortness of breath.   Cardiovascular:  Negative for chest pain, leg swelling and palpitations.  Gastrointestinal:  Negative for abdominal distention, abdominal pain, constipation, diarrhea, nausea and vomiting.  Endocrine: Negative for hot flashes.  Genitourinary:  Negative for difficulty urinating.   Musculoskeletal:  Negative for arthralgias.  Skin:  Negative for itching and rash.  Neurological:  Negative for dizziness, extremity weakness, headaches and numbness.  Hematological:  Negative for adenopathy. Does not bruise/bleed easily.  Psychiatric/Behavioral:  Negative for depression. The patient is  not nervous/anxious.       PHYSICAL EXAMINATION     Vitals:   07/18/22 0827  BP: (!) 143/71  Pulse: 93  Resp: 18  Temp: 97.7 F (36.5 C)  SpO2: 99%    Physical Exam Constitutional:      General: She is not in acute distress.    Appearance: Normal appearance. She is not toxic-appearing.  HENT:     Head: Normocephalic and atraumatic.     Mouth/Throat:     Mouth: Mucous membranes are moist.     Pharynx: Oropharynx is clear. No oropharyngeal exudate or posterior oropharyngeal erythema.  Eyes:     General: No scleral icterus. Cardiovascular:     Rate and Rhythm: Normal rate and regular rhythm.     Pulses: Normal pulses.     Heart sounds: Normal heart sounds.  Pulmonary:     Effort: Pulmonary effort is normal.     Breath sounds: Normal breath sounds.  Abdominal:     General: Abdomen is flat. Bowel sounds are normal. There is no distension.     Palpations: Abdomen is soft.  Tenderness: There is no abdominal tenderness.  Musculoskeletal:        General: No swelling.     Cervical back: Neck supple.  Lymphadenopathy:     Cervical: No cervical adenopathy.  Skin:    General: Skin is warm and dry.     Findings: No rash.  Neurological:     General: No focal deficit present.     Mental Status: She is alert.  Psychiatric:        Mood and Affect: Mood normal.        Behavior: Behavior normal.     LABORATORY DATA:  CBC    Component Value Date/Time   WBC 12.1 (H) 07/18/2022 0753   WBC 5.3 05/23/2022 0930   RBC 4.83 07/18/2022 0753   HGB 13.4 07/18/2022 0753   HCT 39.6 07/18/2022 0753   PLT 307 07/18/2022 0753   MCV 82.0 07/18/2022 0753   MCH 27.7 07/18/2022 0753   MCHC 33.8 07/18/2022 0753   RDW 14.2 07/18/2022 0753   LYMPHSABS 0.7 07/18/2022 0753   MONOABS 0.5 07/18/2022 0753   EOSABS 0.0 07/18/2022 0753   BASOSABS 0.0 07/18/2022 0753    CMP     Component Value Date/Time   NA 138 07/18/2022 0753   K 3.7 07/18/2022 0753   CL 105 07/18/2022 0753    CO2 23 07/18/2022 0753   GLUCOSE 141 (H) 07/18/2022 0753   BUN 13 07/18/2022 0753   CREATININE 0.66 07/18/2022 0753   CALCIUM 10.2 07/18/2022 0753   PROT 8.1 07/18/2022 0753   ALBUMIN 4.3 07/18/2022 0753   AST 23 07/18/2022 0753   ALT 28 07/18/2022 0753   ALKPHOS 62 07/18/2022 0753   BILITOT 0.3 07/18/2022 0753   GFRNONAA >60 07/18/2022 0753         ASSESSMENT and THERAPY PLAN:   Malignant neoplasm of upper-inner quadrant of left breast in female, estrogen receptor positive (HCC) This is a 62 year old female patient with newly diagnosed left breast upper inner quadrant invasive ductal carcinoma, grade 2, intermediate to high-grade DCIS ER 100% strong staining PR 0% negative, Ki-67 of 10% and group 5 HER2 negative who is referred to medical oncology for additional recommendations.  She is currently scheduled for surgery on 423.  The exact size of the tumor remains undetermined although on the biopsy it measured approximately 7 mm.  She underwent surgery which showed no evidence of residual disease. Oncotype Dx from the core specimen however showed score of 34, distant risk recurrence of 24% estimated at 9 years, benefit from chemotherapy noted.  We have reviewed the Oncotype DX score and recommended to consider adjuvant chemotherapy with TC every 21 days for 4 cycles. I have reviewed the regimen every 21 days for 4 cycles,she is here before C2D1. No concerns reported. No concerns on exam CBC reviewed and satisfactory, CMP pending. Ok to proceed with chemo as planned.      All questions were answered. The patient knows to call the clinic with any problems, questions or concerns. We can certainly see the patient much sooner if necessary.  Total encounter time:20 minutes*in face-to-face visit time, chart review, lab review, care coordination, order entry, and documentation of the encounter time.   *Total Encounter Time as defined by the Centers for Medicare and Medicaid Services  includes, in addition to the face-to-face time of a patient visit (documented in the note above) non-face-to-face time: obtaining and reviewing outside history, ordering and reviewing medications, tests or procedures, care coordination (communications with other health  care professionals or caregivers) and documentation in the medical record.

## 2022-07-18 NOTE — Patient Instructions (Signed)
[facility]  Discharge Instructions: Thank you for choosing [facility] to provide your oncology and hematology care.   If you have a lab appointment with the Cancer Center, please go directly to the Cancer Center and check in at the registration area.   Wear comfortable clothing and clothing appropriate for easy access to any Portacath or PICC line.   We strive to give you quality time with your provider. You may need to reschedule your appointment if you arrive late (15 or more minutes).  Arriving late affects you and other patients whose appointments are after yours.  Also, if you miss three or more appointments without notifying the office, you may be dismissed from the clinic at the provider's discretion.      For prescription refill requests, have your pharmacy contact our office and allow 72 hours for refills to be completed.    Today you received the following chemotherapy and/or immunotherapy agents :Taxotere, Cytoxan      To help prevent nausea and vomiting after your treatment, we encourage you to take your nausea medication as directed.  BELOW ARE SYMPTOMS THAT SHOULD BE REPORTED IMMEDIATELY: *FEVER GREATER THAN 100.4 F (38 C) OR HIGHER *CHILLS OR SWEATING *NAUSEA AND VOMITING THAT IS NOT CONTROLLED WITH YOUR NAUSEA MEDICATION *UNUSUAL SHORTNESS OF BREATH *UNUSUAL BRUISING OR BLEEDING *URINARY PROBLEMS (pain or burning when urinating, or frequent urination) *BOWEL PROBLEMS (unusual diarrhea, constipation, pain near the anus) TENDERNESS IN MOUTH AND THROAT WITH OR WITHOUT PRESENCE OF ULCERS (sore throat, sores in mouth, or a toothache) UNUSUAL RASH, SWELLING OR PAIN  UNUSUAL VAGINAL DISCHARGE OR ITCHING   Items with * indicate a potential emergency and should be followed up as soon as possible or go to the Emergency Department if any problems should occur.  Please show the CHEMOTHERAPY ALERT CARD or IMMUNOTHERAPY ALERT CARD  at check-in to the Emergency Department and triage nurse.  Should you have questions after your visit or need to cancel or reschedule your appointment, please contact [facility]  Dept: [phone]  and follow the prompts.  Office hours are 8:00 a.m. to 4:30 p.m. Monday - Friday. Please note that voicemails left after 4:00 p.m. may not be returned until the following business day.  We are closed weekends and major holidays. You have access to a nurse at all times for urgent questions. Please call the main number to the clinic Dept: [phone] and follow the prompts.   For any non-urgent questions, you may also contact your provider using MyChart. We now offer e-Visits for anyone 18 and older to request care online for non-urgent symptoms. For details visit [email or web address].   Also download the MyChart app! Go to the app store, search "MyChart", open the app, select [facility], and log in with your MyChart username and password.   

## 2022-07-18 NOTE — Assessment & Plan Note (Addendum)
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  She is currently scheduled for surgery on 423.  The exact size of the tumor remains undetermined although on the biopsy it measured approximately 7 mm.  She underwent surgery which showed no evidence of residual disease. Oncotype Dx from the core specimen however showed score of 34, distant risk recurrence of 24% estimated at 9 years, benefit from chemotherapy noted.  We have reviewed the Oncotype DX score and recommended to consider adjuvant chemotherapy with TC every 21 days for 4 cycles. I have reviewed the regimen every 21 days for 4 cycles,she is here before C2D1. No concerns reported. No concerns on exam CBC reviewed and satisfactory, CMP pending. Ok to proceed with chemo as planned.

## 2022-07-20 ENCOUNTER — Inpatient Hospital Stay

## 2022-07-20 VITALS — BP 132/65 | HR 93 | Temp 98.0°F | Ht 66.0 in

## 2022-07-20 DIAGNOSIS — C50212 Malignant neoplasm of upper-inner quadrant of left female breast: Secondary | ICD-10-CM

## 2022-07-20 DIAGNOSIS — Z5111 Encounter for antineoplastic chemotherapy: Secondary | ICD-10-CM | POA: Diagnosis not present

## 2022-07-20 MED ORDER — PEGFILGRASTIM-CBQV 6 MG/0.6ML ~~LOC~~ SOSY
6.0000 mg | PREFILLED_SYRINGE | Freq: Once | SUBCUTANEOUS | Status: AC
  Administered 2022-07-20: 6 mg via SUBCUTANEOUS

## 2022-07-22 ENCOUNTER — Encounter: Payer: Self-pay | Admitting: *Deleted

## 2022-07-31 ENCOUNTER — Telehealth: Payer: Self-pay | Admitting: Hematology and Oncology

## 2022-07-31 NOTE — Telephone Encounter (Signed)
Scheduled appointments per WQ. Patient is aware of the made appointments.  

## 2022-08-05 ENCOUNTER — Ambulatory Visit (INDEPENDENT_AMBULATORY_CARE_PROVIDER_SITE_OTHER)

## 2022-08-05 ENCOUNTER — Ambulatory Visit (HOSPITAL_COMMUNITY)
Admission: EM | Admit: 2022-08-05 | Discharge: 2022-08-05 | Disposition: A | Discharge status: Home / Self Care | Attending: Internal Medicine | Admitting: Internal Medicine

## 2022-08-05 ENCOUNTER — Encounter (HOSPITAL_COMMUNITY): Payer: Self-pay

## 2022-08-05 DIAGNOSIS — S92322A Displaced fracture of second metatarsal bone, left foot, initial encounter for closed fracture: Secondary | ICD-10-CM

## 2022-08-05 DIAGNOSIS — S92345A Nondisplaced fracture of fourth metatarsal bone, left foot, initial encounter for closed fracture: Secondary | ICD-10-CM

## 2022-08-05 DIAGNOSIS — S92352A Displaced fracture of fifth metatarsal bone, left foot, initial encounter for closed fracture: Secondary | ICD-10-CM | POA: Diagnosis not present

## 2022-08-05 DIAGNOSIS — S92332A Displaced fracture of third metatarsal bone, left foot, initial encounter for closed fracture: Secondary | ICD-10-CM

## 2022-08-05 NOTE — ED Triage Notes (Signed)
Pt states walking to her mailbox around 2 today and her lt foot went into a hole causing her to fall. Swelling noted, states pain to walk on it.

## 2022-08-05 NOTE — ED Provider Notes (Signed)
[facility]    CSN: [id number] Arrival date & time: 08/05/22  1308      History   Chief Complaint Chief Complaint  Patient presents with   Foot Injury    HPI Isabel [name] is a 62 y.o. female.   Patient presents with pain to left ankle after she stepped in a hole causing her to fall forward earlier today when walking to her mailbox.  She reports pain is a 10 out of 10 weightbearing 0 out of 10 when she is off of the foot.  She has tried nothing for the symptoms.  No other injuries.  Swelling to left ankle noted    Past Medical History:  Diagnosis Date   Breast cancer (HCC) 05/01/2022   GERD (gastroesophageal reflux disease)    HYPERGLYCEMIA    OBESITY    Pre-diabetes     Patient Active Problem List   Diagnosis Date Noted   Malignant neoplasm of upper-inner quadrant of left breast in female, estrogen receptor positive (HCC) 05/13/2022   Pre-diabetes 04/01/2022   Routine general medical examination at a health care facility 09/29/2014   Obesity (BMI 35.0-39.9 without comorbidity) 04/18/2009    Past Surgical History:  Procedure Laterality Date   AXILLARY SENTINEL NODE BIOPSY Left 05/28/2022   Procedure: LEFT AXILLARY SENTINEL NODE BIOPSY;  Surgeon: Manus Rudd, MD;  Location: [facility];  Service: General;  Laterality: Left;   BREAST BIOPSY Left 04/30/2022   MM LT BREAST BX W LOC DEV 1ST LESION IMAGE BX SPEC STEREO GUIDE 04/30/2022 [facility]   BREAST BIOPSY  05/27/2022   MM LT RADIOACTIVE SEED LOC MAMMO GUIDE 05/27/2022 [facility]   BREAST LUMPECTOMY WITH RADIOACTIVE SEED AND SENTINEL LYMPH NODE BIOPSY Left 05/28/2022   Procedure: LEFT BREAST LUMPECTOMY WITH RADIOACTIVE SEED;  Surgeon: Manus Rudd, MD;  Location: [facility];  Service: General;  Laterality: Left;   CESAREAN SECTION  1992   LAPAROSCOPIC CHOLECYSTECTOMY  2005    OB History   No obstetric history on file.      Home Medications    Prior to Admission medications   Medication  Sig Start Date End Date Taking? Authorizing Provider  aspirin EC 81 MG tablet Take 81 mg by mouth daily.    [provider]  Calcium Carb-Cholecalciferol (CALCIUM-VITAMIN D) 500-200 MG-UNIT tablet Take 1 tablet by mouth daily.    [provider]  dexamethasone (DECADRON) 4 MG tablet Take 2 tabs by mouth 2 times daily starting day before chemo. Then take 2 tabs daily for 2 days starting day after chemo. Take with food. 06/25/22   Rachel Moulds, MD  Multiple Vitamins-Calcium (ONE-A-DAY WOMENS FORMULA) TABS Take 1 tablet by mouth daily.    [provider]  Multiple Vitamins-Minerals (EMERGEN-C VITAMIN C LITE) PACK Take by mouth.    [provider]  Omega-3 Fatty Acids (FISH OIL) 1000 MG CAPS Take 1,000 mg by mouth daily.    [provider]  ondansetron (ZOFRAN) 8 MG tablet Take 1 tablet (8 mg total) by mouth every 8 (eight) hours as needed for nausea or vomiting. Start on the third day after chemotherapy. Patient not taking: Reported on 06/25/2022 06/25/22   Rachel Moulds, MD  prochlorperazine (COMPAZINE) 10 MG tablet Take 1 tablet (10 mg total) by mouth every 6 (six) hours as needed for nausea or vomiting. 06/25/22   Rachel Moulds, MD  VITAJOY BIOTIN GUMMIES PO Take by mouth daily.    [provider]  VITAMIN E PO Take  1 capsule by mouth daily.    [provider]    Family History Family History  Problem Relation Age of Onset   Prostate cancer Father    Breast cancer Maternal Aunt 76   Stroke Maternal Grandmother    Hypertension Brother    Kidney disease Brother    Colon cancer Neg Hx    Stomach cancer Neg Hx     Social History Social History   Tobacco Use   Smoking status: Never   Smokeless tobacco: Never   Tobacco comments:    Married, 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   Review of Systems Review of Systems  Constitutional:  Negative for chills and fever.  HENT:  Negative for ear pain and sore throat.   Eyes:  Negative for pain and visual disturbance.  Respiratory:  Negative for cough and shortness of breath.   Cardiovascular:  Negative for chest pain and palpitations.  Gastrointestinal:  Negative for abdominal pain and vomiting.  Genitourinary:  Negative for dysuria and hematuria.  Musculoskeletal:  Positive for arthralgias. Negative for back pain.  Skin:  Negative for color change and rash.  Neurological:  Negative for seizures and syncope.  All other systems reviewed and are negative.    Physical Exam Triage Vital Signs ED Triage Vitals [08/05/22 2000]  Enc Vitals Group     BP (!) 164/80     Pulse Rate 96     Resp 18     Temp 98.3 F (36.8 C)     Temp Source Oral     SpO2 97 %     Weight      Height      Head Circumference      Peak Flow      Pain Score 10     Pain Loc      Pain Edu?      Excl. in GC?    No data found.  Updated Vital Signs BP (!) 164/80 (BP Location: Right Arm)   Pulse 96   Temp 98.3 F (36.8 C) (Oral)   Resp 18   LMP 11/22/2011   SpO2 97%   Visual Acuity Right Eye Distance:   Left Eye Distance:   Bilateral Distance:    Right Eye Near:   Left Eye Near:    Bilateral Near:     Physical Exam Vitals and nursing note reviewed.  Constitutional:      General: She is not in acute distress.    Appearance: She is well-developed.  HENT:     Head: Normocephalic and atraumatic.  Eyes:     Conjunctiva/sclera: Conjunctivae normal.  Cardiovascular:     Rate and Rhythm: Normal rate and regular rhythm.     Heart sounds: No murmur heard. Pulmonary:     Effort: Pulmonary effort is normal. No respiratory distress.     Breath sounds: Normal breath sounds.  Abdominal:     Palpations:  Abdomen is soft.     Tenderness: There is no abdominal tenderness.  Musculoskeletal:        General: No swelling.     Cervical back: Neck supple.     Comments: Swelling  noted to left lateral malleolus.  No tenderness to palpation to lateral malleolus.  Tenderness to palpation to mid anterior foot.  Normal cap refill, sensation intact, neurovascularly intact.  Skin:    General: Skin is warm and dry.     Capillary Refill: Capillary refill takes less than 2 seconds.  Neurological:     Mental Status: She is alert.  Psychiatric:        Mood and Affect: Mood normal.      UC Treatments / Results  Labs (all labs ordered are listed, but only abnormal results are displayed) Labs Reviewed - No data to display  EKG   Radiology DG Foot Complete Left  Result Date: 08/05/2022 CLINICAL DATA:  Recent fall with foot pain, initial encounter EXAM: LEFT FOOT - COMPLETE 3+ VIEW COMPARISON:  None Available. FINDINGS: There are mildly displaced fractures of the distal aspect of the second through fifth metatarsals. Only minimal displacement is seen. Generalized soft tissue swelling is noted. Calcaneal spurring is seen. IMPRESSION: Fractures involving the distal second through fifth metatarsals. Electronically Signed   By: Alcide Clever M.D.   On: 08/05/2022 20:38    Procedures Procedures (including critical care time)  Medications Ordered in UC Medications - No data to display  Initial Impression / Assessment and Plan / UC Course  I have reviewed the triage vital signs and the nursing notes.  Pertinent labs & imaging results that were available during my care of the patient were reviewed by me and considered in my medical decision making (see chart for details).     Distal fractures noted to second through fifth metatarsals of the left foot.  Posterior splint applied in clinic today crutches given to patient.  Advised to follow-up with orthopedic tomorrow.  On-call provider is Dr. Carola Frost.  Patient  prefers to go to [facility].  Number given advised that she cannot get appointment to go to walk-in clinic.  Patient defers pain medication at this time.  She reports Tylenol and ibuprofen will be enough.  Supportive care discussed. Final Clinical Impressions(s) / UC Diagnoses   Final diagnoses:  Closed displaced fracture of second metatarsal bone of left foot, initial encounter  Closed displaced fracture of third metatarsal bone of left foot, initial encounter  Closed nondisplaced fracture of fourth metatarsal bone of left foot, initial encounter  Closed displaced fracture of fifth metatarsal bone of left foot, initial encounter     Discharge Instructions      Elevate, apply ice Call orthopedics first thing in the morning  Can take Ibuprofen or tylenol as needed    ED Prescriptions   None    PDMP not reviewed this encounter.   Ward, Tylene Fantasia, PA-C 08/05/22 2102

## 2022-08-05 NOTE — Discharge Instructions (Addendum)
Elevate, apply ice Call orthopedics first thing in the morning  Can take Ibuprofen or tylenol as needed

## 2022-08-06 MED FILL — Dexamethasone Sodium Phosphate Inj 100 MG/10ML: INTRAMUSCULAR | Qty: 1 | Status: AC

## 2022-08-07 ENCOUNTER — Inpatient Hospital Stay: Attending: Hematology and Oncology

## 2022-08-07 ENCOUNTER — Inpatient Hospital Stay

## 2022-08-07 ENCOUNTER — Encounter: Payer: Self-pay | Admitting: Adult Health

## 2022-08-07 ENCOUNTER — Other Ambulatory Visit: Payer: Self-pay

## 2022-08-07 ENCOUNTER — Inpatient Hospital Stay (HOSPITAL_BASED_OUTPATIENT_CLINIC_OR_DEPARTMENT_OTHER): Admitting: Adult Health

## 2022-08-07 DIAGNOSIS — K219 Gastro-esophageal reflux disease without esophagitis: Secondary | ICD-10-CM | POA: Insufficient documentation

## 2022-08-07 DIAGNOSIS — Z170 Estrogen receptor positive status [ER+]: Secondary | ICD-10-CM

## 2022-08-07 DIAGNOSIS — R5383 Other fatigue: Secondary | ICD-10-CM | POA: Insufficient documentation

## 2022-08-07 DIAGNOSIS — Z923 Personal history of irradiation: Secondary | ICD-10-CM | POA: Diagnosis not present

## 2022-08-07 DIAGNOSIS — E669 Obesity, unspecified: Secondary | ICD-10-CM | POA: Diagnosis not present

## 2022-08-07 DIAGNOSIS — C50212 Malignant neoplasm of upper-inner quadrant of left female breast: Secondary | ICD-10-CM

## 2022-08-07 DIAGNOSIS — Z803 Family history of malignant neoplasm of breast: Secondary | ICD-10-CM | POA: Insufficient documentation

## 2022-08-07 DIAGNOSIS — Z5189 Encounter for other specified aftercare: Secondary | ICD-10-CM | POA: Insufficient documentation

## 2022-08-07 DIAGNOSIS — Z5111 Encounter for antineoplastic chemotherapy: Secondary | ICD-10-CM | POA: Insufficient documentation

## 2022-08-07 DIAGNOSIS — Z8042 Family history of malignant neoplasm of prostate: Secondary | ICD-10-CM | POA: Diagnosis not present

## 2022-08-07 LAB — CBC WITH DIFFERENTIAL (CANCER CENTER ONLY)
Abs Immature Granulocytes: 0.08 10*3/uL — ABNORMAL HIGH (ref 0.00–0.07)
Basophils Absolute: 0 10*3/uL (ref 0.0–0.1)
Basophils Relative: 0 %
Eosinophils Absolute: 0 10*3/uL (ref 0.0–0.5)
Eosinophils Relative: 0 %
HCT: 37.6 % (ref 36.0–46.0)
Hemoglobin: 12.7 g/dL (ref 12.0–15.0)
Immature Granulocytes: 1 %
Lymphocytes Relative: 6 %
Lymphs Abs: 0.8 10*3/uL (ref 0.7–4.0)
MCH: 28.3 pg (ref 26.0–34.0)
MCHC: 33.8 g/dL (ref 30.0–36.0)
MCV: 83.7 fL (ref 80.0–100.0)
Monocytes Absolute: 0.8 10*3/uL (ref 0.1–1.0)
Monocytes Relative: 6 %
Neutro Abs: 11.2 10*3/uL — ABNORMAL HIGH (ref 1.7–7.7)
Neutrophils Relative %: 87 %
Platelet Count: 268 10*3/uL (ref 150–400)
RBC: 4.49 MIL/uL (ref 3.87–5.11)
RDW: 14.8 % (ref 11.5–15.5)
WBC Count: 12.9 10*3/uL — ABNORMAL HIGH (ref 4.0–10.5)
nRBC: 0 % (ref 0.0–0.2)

## 2022-08-07 LAB — CMP (CANCER CENTER ONLY)
ALT: 23 U/L (ref 0–44)
AST: 18 U/L (ref 15–41)
Albumin: 4.3 g/dL (ref 3.5–5.0)
Alkaline Phosphatase: 61 U/L (ref 38–126)
Anion gap: 10 (ref 5–15)
BUN: 11 mg/dL (ref 6–20)
CO2: 24 mmol/L (ref 22–32)
Calcium: 10.4 mg/dL — ABNORMAL HIGH (ref 8.9–10.3)
Chloride: 105 mmol/L (ref 98–111)
Creatinine: 0.73 mg/dL (ref 0.44–1.00)
GFR, Estimated: >60 mL/min (ref >60)
Glucose, Bld: 110 mg/dL — ABNORMAL HIGH (ref 70–99)
Potassium: 3.9 mmol/L (ref 3.5–5.1)
Sodium: 139 mmol/L (ref 135–145)
Total Bilirubin: 0.3 mg/dL (ref 0.3–1.2)
Total Protein: 7.3 g/dL (ref 6.5–8.1)

## 2022-08-07 MED ORDER — SODIUM CHLORIDE 0.9 % IV SOLN: Freq: Once | INTRAVENOUS | Status: AC

## 2022-08-07 MED ORDER — SODIUM CHLORIDE 0.9 % IV SOLN
10.0000 mg | Freq: Once | INTRAVENOUS | Status: AC
  Administered 2022-08-07: 10 mg via INTRAVENOUS
  Filled 2022-08-07: qty 10

## 2022-08-07 MED ORDER — PALONOSETRON HCL INJECTION 0.25 MG/5ML
0.2500 mg | Freq: Once | INTRAVENOUS | Status: AC
  Administered 2022-08-07: 0.25 mg via INTRAVENOUS
  Filled 2022-08-07: qty 5

## 2022-08-07 MED ORDER — SODIUM CHLORIDE 0.9 % IV SOLN
600.0000 mg/m2 | Freq: Once | INTRAVENOUS | Status: AC
  Administered 2022-08-07: 1300 mg via INTRAVENOUS
  Filled 2022-08-07: qty 65

## 2022-08-07 MED ORDER — SODIUM CHLORIDE 0.9 % IV SOLN
75.0000 mg/m2 | Freq: Once | INTRAVENOUS | Status: AC
  Administered 2022-08-07: 160 mg via INTRAVENOUS
  Filled 2022-08-07: qty 16

## 2022-08-07 NOTE — Assessment & Plan Note (Signed)
Isabel [name] is a 62 year old woman with stage IA ER+ breast cancer s/p lumpectomy and here for adjuvant chemotherapy with Taxotere/Cytoxan.    Treatment plan: Breast conserving surgery with SLN biopsy Oncotype Adjuvant chemotherapy Adjuvant radiation therapy Adjuvant antiestrogen therapy  Current Treatment: Taxotere/Cytoxan cycle 3 day 1  Toxicities:  1.  Fatigue: Managed with energy conservation which she will continue to do. 2.  Unrelated to treatment, left displaced fracture of foot: I recommended that she take Aleve 1 tablet twice daily with food to help decrease inflammation in her foot.  I also recommended that she follow-up with [facility] as scheduled.  Considering the fracture is displaced she may need surgery and we will coordinate with [facility] based on their recommendation and the patient's wishes when the best time to do that is considering she has 1 more chemotherapy cycle after today.  Her chemotherapy completion date is August 29, 2022.  Her CBC is stable.  C-Met is pending.  She will proceed with treatment today.  She will return in 3 weeks for labs, follow-up, and her next treatment.

## 2022-08-07 NOTE — Progress Notes (Signed)
[facility] Cancer Follow up:    Isabel Broker, MD [address] [place] [address]   DIAGNOSIS:  Cancer Staging  Malignant neoplasm of upper-inner quadrant of left breast in female, estrogen receptor positive (HCC) Staging form: Breast, AJCC 8th Edition - Pathologic stage from 05/28/2022: Stage IA (pT1b, pN0, cM0, G2, ER+, PR-, HER2-) - Signed by Loa Socks, NP on 06/27/2022 Histologic grading system: 3 grade system   SUMMARY OF ONCOLOGIC HISTORY: Oncology History  Malignant neoplasm of upper-inner quadrant of left breast in female, estrogen receptor positive (HCC)  04/02/2022 Mammogram   In the left breast a possible asymmetry warrants further evaluation.  In the right breast, no findings suspicious for malignancy.  Further evaluation is suggested for possible asymmetry in the left breast.  Ultrasound breast showed indeterminate left breast focal asymmetry with possible distortion.    Pathology Results   Left breast needle core biopsy showed invasive ductal carcinoma overall grade 2, DCIS intermediate to high-grade, prognostic showed ER 100% positive strong staining PR 0% negative, Ki-67 of 10% and group 5 HER2 negative   05/28/2022 Definitive Surgery   Left lumpectomy showed no evidence of residual malignancy, 2 axillary lymph nodes negative for carcinoma.  Original biopsy showed 7 mm invasive ductal carcinoma.   05/28/2022 Cancer Staging   Staging form: Breast, AJCC 8th Edition - Pathologic stage from 05/28/2022: Stage IA (pT1b, pN0, cM0, G2, ER+, PR-, HER2-) - Signed by Loa Socks, NP on 06/27/2022 Histologic grading system: 3 grade system   06/06/2022 Oncotype testing   36/24%, >15% absolute chemotherapy benefit   06/27/2022 -  Chemotherapy   Patient is on Treatment Plan : BREAST TC q21d       CURRENT THERAPY: Taxotere/Cytoxan  INTERVAL HISTORY: Isabel Barbuto [name] 62 y.o. female returns for follow-up and evaluation prior to  receiving cycle 3 of Taxotere and Cytoxan. She fell a couple of days ago and has a displaced fracture in her left foot.  Her foot is wrapped and also in a surgical shoe.  She is not taking any aleve.  She is elevating her foot.  She is not using crutches because of her surgery and lymph node removal it makes walking on crutches difficult.   Patient Active Problem List   Diagnosis Date Noted   Malignant neoplasm of upper-inner quadrant of left breast in female, estrogen receptor positive (HCC) 05/13/2022   Pre-diabetes 04/01/2022   Routine general medical examination at a health care facility 09/29/2014   Obesity (BMI 35.0-39.9 without comorbidity) 04/18/2009    has No Known Allergies.  MEDICAL HISTORY: Past Medical History:  Diagnosis Date   Breast cancer (HCC) 05/01/2022   GERD (gastroesophageal reflux disease)    HYPERGLYCEMIA    OBESITY    Pre-diabetes     SURGICAL HISTORY: Past Surgical History:  Procedure Laterality Date   AXILLARY SENTINEL NODE BIOPSY Left 05/28/2022   Procedure: LEFT AXILLARY SENTINEL NODE BIOPSY;  Surgeon: Manus Rudd, MD;  [facility];  Service: General;  Laterality: Left;   BREAST BIOPSY Left 04/30/2022   MM LT BREAST BX W LOC DEV 1ST LESION IMAGE BX SPEC STEREO GUIDE 04/30/2022 [facility]   BREAST BIOPSY  05/27/2022   MM LT RADIOACTIVE SEED LOC MAMMO GUIDE 05/27/2022 [facility]   BREAST LUMPECTOMY WITH RADIOACTIVE SEED AND SENTINEL LYMPH NODE BIOPSY Left 05/28/2022   Procedure: LEFT BREAST LUMPECTOMY WITH RADIOACTIVE SEED;  Surgeon: Manus Rudd, MD;  Location: [facility];  Service: General;  Laterality: Left;   CESAREAN SECTION  1992   LAPAROSCOPIC CHOLECYSTECTOMY  2005    SOCIAL HISTORY: Social History   Socioeconomic History   Marital status: Married    Spouse name: Not on file   Number of children: Not on file   Years of education: Not on file   Highest education level: Not on file  Occupational History   Not on file   Tobacco Use   Smoking status: Never   Smokeless tobacco: Never   Tobacco comments:    Married, lives with spouse. works at Investment banker, corporate Use: Never used  Substance and Sexual Activity   Alcohol use: Not Currently    Alcohol/week: 2.0 standard drinks of alcohol    Types: 2 Glasses of wine per week   Drug use: No   Sexual activity: Not on file  Other Topics Concern   Not on file  Social History Narrative   Not on file   Social Determinants of Health   Financial Resource Strain: Not on file  Food Insecurity: Not on file  Transportation Needs: Not on file  Physical Activity: Not on file  Stress: Not on file  Social Connections: Not on file  Intimate Partner Violence: Not on file    FAMILY HISTORY: Family History  Problem Relation Age of Onset   Prostate cancer Father    Breast cancer Maternal Aunt 55   Stroke Maternal Grandmother    Hypertension Brother    Kidney disease Brother    Colon cancer Neg Hx    Stomach cancer Neg Hx     Review of Systems  Constitutional:  Positive for fatigue. Negative for appetite change, chills, fever and unexpected weight change.  HENT:   Negative for hearing loss, lump/mass and trouble swallowing.   Eyes:  Negative for eye problems and icterus.  Respiratory:  Negative for chest tightness, cough and shortness of breath.   Cardiovascular:  Negative for chest pain, leg swelling and palpitations.  Gastrointestinal:  Negative for abdominal distention, abdominal pain, constipation, diarrhea, nausea and vomiting.  Endocrine: Negative for hot flashes.  Genitourinary:  Negative for difficulty urinating.   Musculoskeletal:  Negative for arthralgias.  Skin:  Negative for itching and rash.  Neurological:  Negative for dizziness, extremity weakness, headaches and numbness.  Hematological:  Negative for adenopathy. Does not bruise/bleed easily.  Psychiatric/Behavioral:  Negative for depression. The patient is  not nervous/anxious.       PHYSICAL EXAMINATION   Onc Performance Status - 08/07/22 0842       ECOG Perf Status   ECOG Perf Status Fully active, able to carry on all pre-disease performance without restriction      KPS SCALE   KPS % SCORE Normal, no compliants, no evidence of disease             Vitals:   08/07/22 0836  BP: (!) 154/66  Pulse: 93  Resp: 18  Temp: (!) 97.5 F (36.4 C)  SpO2: 99%    Physical Exam Constitutional:      General: She is not in acute distress.    Appearance: Normal appearance. She is not toxic-appearing.  HENT:     Head: Normocephalic and atraumatic.     Mouth/Throat:     Mouth: Mucous membranes are moist.     Pharynx: Oropharynx is clear. No oropharyngeal exudate or posterior oropharyngeal erythema.  Eyes:     General: No scleral icterus. Cardiovascular:     Rate  and Rhythm: Normal rate and regular rhythm.     Pulses: Normal pulses.     Heart sounds: Normal heart sounds.  Pulmonary:     Effort: Pulmonary effort is normal.     Breath sounds: Normal breath sounds.  Abdominal:     General: Abdomen is flat. Bowel sounds are normal. There is no distension.     Palpations: Abdomen is soft.     Tenderness: There is no abdominal tenderness.  Musculoskeletal:        General: No swelling.     Cervical back: Neck supple.     Comments: Left foot in Ace bandage and splint with surgical shoe on.  Lymphadenopathy:     Cervical: No cervical adenopathy.  Skin:    General: Skin is warm and dry.     Findings: No rash.  Neurological:     General: No focal deficit present.     Mental Status: She is alert.  Psychiatric:        Mood and Affect: Mood normal.        Behavior: Behavior normal.     LABORATORY DATA:  CBC    Component Value Date/Time   WBC 12.9 (H) 08/07/2022 0824   WBC 5.3 05/23/2022 0930   RBC 4.49 08/07/2022 0824   HGB 12.7 08/07/2022 0824   HCT 37.6 08/07/2022 0824   PLT 268 08/07/2022 0824   MCV 83.7 08/07/2022 0824    MCH 28.3 08/07/2022 0824   MCHC 33.8 08/07/2022 0824   RDW 14.8 08/07/2022 0824   LYMPHSABS 0.8 08/07/2022 0824   MONOABS 0.8 08/07/2022 0824   EOSABS 0.0 08/07/2022 0824   BASOSABS 0.0 08/07/2022 0824    CMP     Component Value Date/Time   NA 139 08/07/2022 0824   K 3.9 08/07/2022 0824   CL 105 08/07/2022 0824   CO2 24 08/07/2022 0824   GLUCOSE 110 (H) 08/07/2022 0824   BUN 11 08/07/2022 0824   CREATININE 0.73 08/07/2022 0824   CALCIUM 10.4 (H) 08/07/2022 0824   PROT 7.3 08/07/2022 0824   ALBUMIN 4.3 08/07/2022 0824   AST 18 08/07/2022 0824   ALT 23 08/07/2022 0824   ALKPHOS 61 08/07/2022 0824   BILITOT 0.3 08/07/2022 0824   GFRNONAA >60 08/07/2022 0824      ASSESSMENT and THERAPY PLAN:   Malignant neoplasm of upper-inner quadrant of left breast in female, estrogen receptor positive (HCC) Isabel [name] is a 62 year old woman with stage IA ER+ breast cancer s/p lumpectomy and here for adjuvant chemotherapy with Taxotere/Cytoxan.    Treatment plan: Breast conserving surgery with SLN biopsy Oncotype Adjuvant chemotherapy Adjuvant radiation therapy Adjuvant antiestrogen therapy  Current Treatment: Taxotere/Cytoxan cycle 3 day 1  Toxicities:  1.  Fatigue: Managed with energy conservation which she will continue to do. 2.  Unrelated to treatment, left displaced fracture of foot: I recommended that she take Aleve 1 tablet twice daily with food to help decrease inflammation in her foot.  I also recommended that she follow-up with [facility] as scheduled.  Considering the fracture is displaced she may need surgery and we will coordinate with [facility] based on their recommendation and the patient's wishes when the best time to do that is considering she has 1 more chemotherapy cycle after today.  Her chemotherapy completion date is August 29, 2022.  Her CBC is stable.  C-Met is pending.  She will proceed with treatment today.  She will return in 3 weeks for labs, follow-up, and  her  next treatment.    All questions were answered. The patient knows to call the clinic with any problems, questions or concerns. We can certainly see the patient much sooner if necessary.  Total encounter time:20 minutes*in face-to-face visit time, chart review, lab review, care coordination, order entry, and documentation of the encounter time.    Lillard Anes, NP 08/07/22 9:12 AM Medical Oncology and Hematology [facility] [address] [place], [place] [address] Tel. [phone]    Fax. [phone]  *Total Encounter Time as defined by the Centers for Medicare and Medicaid Services includes, in addition to the face-to-face time of a patient visit (documented in the note above) non-face-to-face time: obtaining and reviewing outside history, ordering and reviewing medications, tests or procedures, care coordination (communications with other health care professionals or caregivers) and documentation in the medical record.

## 2022-08-09 ENCOUNTER — Other Ambulatory Visit: Payer: Self-pay

## 2022-08-09 ENCOUNTER — Inpatient Hospital Stay

## 2022-08-09 VITALS — BP 138/70 | HR 89 | Temp 98.2°F | Resp 17

## 2022-08-09 DIAGNOSIS — Z5111 Encounter for antineoplastic chemotherapy: Secondary | ICD-10-CM | POA: Diagnosis not present

## 2022-08-09 DIAGNOSIS — C50212 Malignant neoplasm of upper-inner quadrant of left female breast: Secondary | ICD-10-CM

## 2022-08-09 MED ORDER — PEGFILGRASTIM-CBQV 6 MG/0.6ML ~~LOC~~ SOSY
6.0000 mg | PREFILLED_SYRINGE | Freq: Once | SUBCUTANEOUS | Status: AC
  Administered 2022-08-09: 6 mg via SUBCUTANEOUS
  Filled 2022-08-09: qty 0.6

## 2022-08-13 ENCOUNTER — Encounter: Payer: Self-pay | Admitting: *Deleted

## 2022-08-13 DIAGNOSIS — Z170 Estrogen receptor positive status [ER+]: Secondary | ICD-10-CM

## 2022-08-15 ENCOUNTER — Other Ambulatory Visit: Payer: Self-pay

## 2022-08-15 ENCOUNTER — Other Ambulatory Visit: Payer: Self-pay | Admitting: *Deleted

## 2022-08-26 ENCOUNTER — Encounter: Payer: Self-pay | Admitting: *Deleted

## 2022-08-27 MED FILL — Dexamethasone Sodium Phosphate Inj 100 MG/10ML: INTRAMUSCULAR | Qty: 1 | Status: AC

## 2022-08-28 ENCOUNTER — Inpatient Hospital Stay (HOSPITAL_BASED_OUTPATIENT_CLINIC_OR_DEPARTMENT_OTHER): Admitting: Adult Health

## 2022-08-28 ENCOUNTER — Other Ambulatory Visit: Payer: Self-pay

## 2022-08-28 ENCOUNTER — Telehealth: Payer: Self-pay | Admitting: Adult Health

## 2022-08-28 ENCOUNTER — Encounter: Payer: Self-pay | Admitting: Adult Health

## 2022-08-28 ENCOUNTER — Inpatient Hospital Stay

## 2022-08-28 VITALS — HR 92

## 2022-08-28 VITALS — BP 142/59 | HR 105 | Temp 97.7°F | Resp 18 | Ht 66.0 in | Wt 213.6 lb

## 2022-08-28 DIAGNOSIS — N63 Unspecified lump in unspecified breast: Secondary | ICD-10-CM

## 2022-08-28 DIAGNOSIS — Z170 Estrogen receptor positive status [ER+]: Secondary | ICD-10-CM

## 2022-08-28 DIAGNOSIS — C50212 Malignant neoplasm of upper-inner quadrant of left female breast: Secondary | ICD-10-CM | POA: Diagnosis not present

## 2022-08-28 DIAGNOSIS — Z5111 Encounter for antineoplastic chemotherapy: Secondary | ICD-10-CM | POA: Diagnosis not present

## 2022-08-28 LAB — CBC WITH DIFFERENTIAL (CANCER CENTER ONLY)
Abs Immature Granulocytes: 0.05 10*3/uL (ref 0.00–0.07)
Basophils Absolute: 0 10*3/uL (ref 0.0–0.1)
Basophils Relative: 0 %
Eosinophils Absolute: 0 10*3/uL (ref 0.0–0.5)
Eosinophils Relative: 0 %
HCT: 35.6 % — ABNORMAL LOW (ref 36.0–46.0)
Hemoglobin: 12.2 g/dL (ref 12.0–15.0)
Immature Granulocytes: 1 %
Lymphocytes Relative: 7 %
Lymphs Abs: 0.7 10*3/uL (ref 0.7–4.0)
MCH: 28.1 pg (ref 26.0–34.0)
MCHC: 34.3 g/dL (ref 30.0–36.0)
MCV: 82 fL (ref 80.0–100.0)
Monocytes Absolute: 0.7 10*3/uL (ref 0.1–1.0)
Monocytes Relative: 7 %
Neutro Abs: 8.7 10*3/uL — ABNORMAL HIGH (ref 1.7–7.7)
Neutrophils Relative %: 85 %
Platelet Count: 315 10*3/uL (ref 150–400)
RBC: 4.34 MIL/uL (ref 3.87–5.11)
RDW: 14.9 % (ref 11.5–15.5)
WBC Count: 10.1 10*3/uL (ref 4.0–10.5)
nRBC: 0 % (ref 0.0–0.2)

## 2022-08-28 LAB — CMP (CANCER CENTER ONLY)
ALT: 19 U/L (ref 0–44)
AST: 17 U/L (ref 15–41)
Albumin: 4.3 g/dL (ref 3.5–5.0)
Alkaline Phosphatase: 68 U/L (ref 38–126)
Anion gap: 10 (ref 5–15)
BUN: 11 mg/dL (ref 6–20)
CO2: 23 mmol/L (ref 22–32)
Calcium: 10.2 mg/dL (ref 8.9–10.3)
Chloride: 107 mmol/L (ref 98–111)
Creatinine: 0.67 mg/dL (ref 0.44–1.00)
GFR, Estimated: >60 mL/min (ref >60)
Glucose, Bld: 123 mg/dL — ABNORMAL HIGH (ref 70–99)
Potassium: 3.9 mmol/L (ref 3.5–5.1)
Sodium: 140 mmol/L (ref 135–145)
Total Bilirubin: 0.4 mg/dL (ref 0.3–1.2)
Total Protein: 7.4 g/dL (ref 6.5–8.1)

## 2022-08-28 MED ORDER — SODIUM CHLORIDE 0.9 % IV SOLN: Freq: Once | INTRAVENOUS | Status: AC

## 2022-08-28 MED ORDER — SODIUM CHLORIDE 0.9 % IV SOLN
600.0000 mg/m2 | Freq: Once | INTRAVENOUS | Status: AC
  Administered 2022-08-28: 1300 mg via INTRAVENOUS
  Filled 2022-08-28: qty 65

## 2022-08-28 MED ORDER — SODIUM CHLORIDE 0.9 % IV SOLN
75.0000 mg/m2 | Freq: Once | INTRAVENOUS | Status: AC
  Administered 2022-08-28: 160 mg via INTRAVENOUS
  Filled 2022-08-28: qty 16

## 2022-08-28 MED ORDER — SODIUM CHLORIDE 0.9 % IV SOLN
10.0000 mg | Freq: Once | INTRAVENOUS | Status: AC
  Administered 2022-08-28: 10 mg via INTRAVENOUS
  Filled 2022-08-28: qty 10

## 2022-08-28 MED ORDER — PALONOSETRON HCL INJECTION 0.25 MG/5ML
0.2500 mg | Freq: Once | INTRAVENOUS | Status: AC
  Administered 2022-08-28: 0.25 mg via INTRAVENOUS
  Filled 2022-08-28: qty 5

## 2022-08-28 NOTE — Progress Notes (Signed)
[facility] Cancer Follow up:    Isabel Broker, MD [address] [place] [address]   DIAGNOSIS:  Cancer Staging  Malignant neoplasm of upper-inner quadrant of left breast in female, estrogen receptor positive (HCC) Staging form: Breast, AJCC 8th Edition - Pathologic stage from 05/28/2022: Stage IA (pT1b, pN0, cM0, G2, ER+, PR-, HER2-) - Signed by Loa Socks, NP on 06/27/2022 Histologic grading system: 3 grade system   SUMMARY OF ONCOLOGIC HISTORY: Oncology History  Malignant neoplasm of upper-inner quadrant of left breast in female, estrogen receptor positive (HCC)  04/02/2022 Mammogram   In the left breast a possible asymmetry warrants further evaluation.  In the right breast, no findings suspicious for malignancy.  Further evaluation is suggested for possible asymmetry in the left breast.  Ultrasound breast showed indeterminate left breast focal asymmetry with possible distortion.    Pathology Results   Left breast needle core biopsy showed invasive ductal carcinoma overall grade 2, DCIS intermediate to high-grade, prognostic showed ER 100% positive strong staining PR 0% negative, Ki-67 of 10% and group 5 HER2 negative   05/28/2022 Definitive Surgery   Left lumpectomy showed no evidence of residual malignancy, 2 axillary lymph nodes negative for carcinoma.  Original biopsy showed 7 mm invasive ductal carcinoma.   05/28/2022 Cancer Staging   Staging form: Breast, AJCC 8th Edition - Pathologic stage from 05/28/2022: Stage IA (pT1b, pN0, cM0, G2, ER+, PR-, HER2-) - Signed by Loa Socks, NP on 06/27/2022 Histologic grading system: 3 grade system   06/06/2022 Oncotype testing   36/24%, >15% absolute chemotherapy benefit   06/27/2022 -  Chemotherapy   Patient is on Treatment Plan : BREAST TC q21d       CURRENT THERAPY: Taxotere/Cytoxan  INTERVAL HISTORY: Isabel [name] 62 y.o. female returns for f/u prior to receiving her fourth  cycle of taxotere and cytoxan.  She is feeling well today.  She is working on changing her diet and wants to know if I have any suggestions for her.    She denies any peripheral neruopathy.  She has some thickness at her left axillary biopsy site that has been worsening and she wants me to evaluate it.  She denies any other issues today.     Patient Active Problem List   Diagnosis Date Noted   Malignant neoplasm of upper-inner quadrant of left breast in female, estrogen receptor positive (HCC) 05/13/2022   Pre-diabetes 04/01/2022   Routine general medical examination at a health care facility 09/29/2014   Obesity (BMI 35.0-39.9 without comorbidity) 04/18/2009    has No Known Allergies.  MEDICAL HISTORY: Past Medical History:  Diagnosis Date   Breast cancer (HCC) 05/01/2022   GERD (gastroesophageal reflux disease)    HYPERGLYCEMIA    OBESITY    Pre-diabetes     SURGICAL HISTORY: Past Surgical History:  Procedure Laterality Date   AXILLARY SENTINEL NODE BIOPSY Left 05/28/2022   Procedure: LEFT AXILLARY SENTINEL NODE BIOPSY;  Surgeon: Manus Rudd, MD;  Location: [facility];  Service: General;  Laterality: Left;   BREAST BIOPSY Left 04/30/2022   MM LT BREAST BX W LOC DEV 1ST LESION IMAGE BX SPEC STEREO GUIDE 04/30/2022 [facility]   BREAST BIOPSY  05/27/2022   MM LT RADIOACTIVE SEED LOC MAMMO GUIDE 05/27/2022 [facility]MO[facility]   BREAST LUMPECTOMY WITH RADIOACTIVE SEED AND SENTINEL LYMPH NODE BIOPSY Left 05/28/2022   Procedure: LEFT BREAST LUMPECTOMY WITH RADIOACTIVE SEED;  Surgeon: Manus Rudd, MD;  Location: [facility] OR;  Service: General;  Laterality: Left;   CESAREAN SECTION  1992   LAPAROSCOPIC CHOLECYSTECTOMY  2005    SOCIAL HISTORY: Social History   Socioeconomic History   Marital status: Married    Spouse name: Not on file   Number of children: Not on file   Years of education: Not on file   Highest education level: Not on file  Occupational History   Not on file   Tobacco Use   Smoking status: Never   Smokeless tobacco: Never   Tobacco comments:    Married, 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 Use   Vaping status: Never Used  Substance and Sexual Activity   Alcohol use: Not Currently    Alcohol/week: 2.0 standard drinks of alcohol    Types: 2 Glasses of wine per week   Drug use: No   Sexual activity: Not on file  Other Topics Concern   Not on file  Social History Narrative   Not on file   Social Determinants of Health   Financial Resource Strain: Not on file  Food Insecurity: Not on file  Transportation Needs: Not on file  Physical Activity: Not on file  Stress: Not on file  Social Connections: Not on file  Intimate Partner Violence: Not on file    FAMILY HISTORY: Family History  Problem Relation Age of Onset   Prostate cancer Father    Breast cancer Maternal Aunt 55   Stroke Maternal Grandmother    Hypertension Brother    Kidney disease Brother    Colon cancer Neg Hx    Stomach cancer Neg Hx     Review of Systems  Constitutional:  Negative for appetite change, chills, fatigue, fever and unexpected weight change.  HENT:   Negative for hearing loss, lump/mass and trouble swallowing.   Eyes:  Negative for eye problems and icterus.  Respiratory:  Negative for chest tightness, cough and shortness of breath.   Cardiovascular:  Negative for chest pain, leg swelling and palpitations.  Gastrointestinal:  Negative for abdominal distention, abdominal pain, constipation, diarrhea, nausea and vomiting.  Endocrine: Negative for hot flashes.  Genitourinary:  Negative for difficulty urinating.   Musculoskeletal:  Negative for arthralgias.  Skin:  Negative for itching and rash.  Neurological:  Negative for dizziness, extremity weakness, headaches and numbness.  Hematological:  Negative for adenopathy. Does not bruise/bleed easily.  Psychiatric/Behavioral:  Negative for depression. The patient is not  nervous/anxious.       PHYSICAL EXAMINATION    Vitals:   08/28/22 0828  BP: (!) 142/59  Pulse: (!) 105  Resp: 18  Temp: 97.7 F (36.5 C)  SpO2: 98%    Physical Exam Constitutional:      General: She is not in acute distress.    Appearance: Normal appearance. She is not toxic-appearing.  HENT:     Head: Normocephalic and atraumatic.     Mouth/Throat:     Mouth: Mucous membranes are moist.     Pharynx: Oropharynx is clear. No oropharyngeal exudate or posterior oropharyngeal erythema.  Eyes:     General: No scleral icterus. Cardiovascular:     Rate and Rhythm: Normal rate and regular rhythm.     Pulses: Normal pulses.     Heart sounds: Normal heart sounds.  Pulmonary:     Effort: Pulmonary effort is normal.     Breath sounds: Normal breath sounds.  Chest:     Comments: Left breast s/p lumpectomy, skin changes noted (patient states these  are new), also left axilla with a bout a 2cm well circumscribed nodule Abdominal:     General: Abdomen is flat. Bowel sounds are normal. There is no distension.     Palpations: Abdomen is soft.     Tenderness: There is no abdominal tenderness.  Musculoskeletal:        General: No swelling.     Cervical back: Neck supple.  Lymphadenopathy:     Cervical: No cervical adenopathy.  Skin:    General: Skin is warm and dry.     Findings: No rash.  Neurological:     General: No focal deficit present.     Mental Status: She is alert.  Psychiatric:        Mood and Affect: Mood normal.        Behavior: Behavior normal.     LABORATORY DATA:  CBC    Component Value Date/Time   WBC 10.1 08/28/2022 0803   WBC 5.3 05/23/2022 0930   RBC 4.34 08/28/2022 0803   HGB 12.2 08/28/2022 0803   HCT 35.6 (L) 08/28/2022 0803   PLT 315 08/28/2022 0803   MCV 82.0 08/28/2022 0803   MCH 28.1 08/28/2022 0803   MCHC 34.3 08/28/2022 0803   RDW 14.9 08/28/2022 0803   LYMPHSABS 0.7 08/28/2022 0803   MONOABS 0.7 08/28/2022 0803   EOSABS 0.0  08/28/2022 0803   BASOSABS 0.0 08/28/2022 0803    CMP     Component Value Date/Time   NA 140 08/28/2022 0803   K 3.9 08/28/2022 0803   CL 107 08/28/2022 0803   CO2 23 08/28/2022 0803   GLUCOSE 123 (H) 08/28/2022 0803   BUN 11 08/28/2022 0803   CREATININE 0.67 08/28/2022 0803   CALCIUM 10.2 08/28/2022 0803   PROT 7.4 08/28/2022 0803   ALBUMIN 4.3 08/28/2022 0803   AST 17 08/28/2022 0803   ALT 19 08/28/2022 0803   ALKPHOS 68 08/28/2022 0803   BILITOT 0.4 08/28/2022 0803   GFRNONAA >60 08/28/2022 0803      ASSESSMENT and THERAPY PLAN:   Malignant neoplasm of upper-inner quadrant of left breast in female, estrogen receptor positive (HCC) Teaira is a 62 year old woman with stage IA ER+ breast cancer s/p lumpectomy and here for adjuvant chemotherapy with Taxotere/Cytoxan.    Treatment plan: Breast conserving surgery with SLN biopsy Oncotype Adjuvant chemotherapy Adjuvant radiation therapy Adjuvant antiestrogen therapy  Current Treatment: Taxotere/Cytoxan cycle 4 day 1  Toxicities:  1.  Fatigue: Managed with energy conservation which she will continue to do. 2.  Left axillary nodule and left breast skin changes: I placed orders for diagnostic mammogram and ultrasound.  This will take place before she begins radiation so we can get a baseline.  I reassured her this is likely a seroma or scar tissue. 3.  I printed out some lifestyle medicine dietary recipes for her.  Her labs are all stable today.  She will proceed with her fourth treatment.  She knows to call for any questions or concerns.  She will proceed with radiation after this and her appointments are already set up.  We will see her back in 8 weeks as it will be time for her to start antiestrogen therapy around then.    All questions were answered. The patient knows to call the clinic with any problems, questions or concerns. We can certainly see the patient much sooner if necessary.  Total encounter time:30  minutes*in face-to-face visit time, chart review, lab review, care coordination, order entry, and  documentation of the encounter time.  Lillard Anes, NP 08/28/22 9:30 AM Medical Oncology and Hematology [facility] [address] [place], [place] [address] Tel. [phone]    Fax. [phone]  *Total Encounter Time as defined by the Centers for Medicare and Medicaid Services includes, in addition to the face-to-face time of a patient visit (documented in the note above) non-face-to-face time: obtaining and reviewing outside history, ordering and reviewing medications, tests or procedures, care coordination (communications with other health care professionals or caregivers) and documentation in the medical record.

## 2022-08-28 NOTE — Telephone Encounter (Signed)
Scheduled appointments per 7/24 los. Patient is aware of the made appointments.

## 2022-08-28 NOTE — Patient Instructions (Signed)
[facility]  Discharge Instructions: Thank you for choosing [facility] to provide your oncology and hematology care.   If you have a lab appointment with the Cancer Center, please go directly to the Cancer Center and check in at the registration area.   Wear comfortable clothing and clothing appropriate for easy access to any Portacath or PICC line.   We strive to give you quality time with your provider. You may need to reschedule your appointment if you arrive late (15 or more minutes).  Arriving late affects you and other patients whose appointments are after yours.  Also, if you miss three or more appointments without notifying the office, you may be dismissed from the clinic at the provider's discretion.      For prescription refill requests, have your pharmacy contact our office and allow 72 hours for refills to be completed.    Today you received the following chemotherapy and/or immunotherapy agents taxotere, cytoxan.      To help prevent nausea and vomiting after your treatment, we encourage you to take your nausea medication as directed.  BELOW ARE SYMPTOMS THAT SHOULD BE REPORTED IMMEDIATELY: *FEVER GREATER THAN 100.4 F (38 C) OR HIGHER *CHILLS OR SWEATING *NAUSEA AND VOMITING THAT IS NOT CONTROLLED WITH YOUR NAUSEA MEDICATION *UNUSUAL SHORTNESS OF BREATH *UNUSUAL BRUISING OR BLEEDING *URINARY PROBLEMS (pain or burning when urinating, or frequent urination) *BOWEL PROBLEMS (unusual diarrhea, constipation, pain near the anus) TENDERNESS IN MOUTH AND THROAT WITH OR WITHOUT PRESENCE OF ULCERS (sore throat, sores in mouth, or a toothache) UNUSUAL RASH, SWELLING OR PAIN  UNUSUAL VAGINAL DISCHARGE OR ITCHING   Items with * indicate a potential emergency and should be followed up as soon as possible or go to the Emergency Department if any problems should occur.  Please show the CHEMOTHERAPY ALERT CARD or IMMUNOTHERAPY ALERT CARD  at check-in to the Emergency Department and triage nurse.  Should you have questions after your visit or need to cancel or reschedule your appointment, please contact [facility]  Dept: [phone]  and follow the prompts.  Office hours are 8:00 a.m. to 4:30 p.m. Monday - Friday. Please note that voicemails left after 4:00 p.m. may not be returned until the following business day.  We are closed weekends and major holidays. You have access to a nurse at all times for urgent questions. Please call the main number to the clinic Dept: [phone] and follow the prompts.   For any non-urgent questions, you may also contact your provider using MyChart. We now offer e-Visits for anyone 12 and older to request care online for non-urgent symptoms. For details visit [email or web address].   Also download the MyChart app! Go to the app store, search "MyChart", open the app, select [place], and log in with your MyChart username and password.

## 2022-08-28 NOTE — Assessment & Plan Note (Signed)
Isabel [name] is a 62 year old woman with stage IA ER+ breast cancer s/p lumpectomy and here for adjuvant chemotherapy with Taxotere/Cytoxan.    Treatment plan: Breast conserving surgery with SLN biopsy Oncotype Adjuvant chemotherapy Adjuvant radiation therapy Adjuvant antiestrogen therapy  Current Treatment: Taxotere/Cytoxan cycle 4 day 1  Toxicities:  1.  Fatigue: Managed with energy conservation which she will continue to do. 2.  Left axillary nodule and left breast skin changes: I placed orders for diagnostic mammogram and ultrasound.  This will take place before she begins radiation so we can get a baseline.  I reassured her this is likely a seroma or scar tissue. 3.  I printed out some lifestyle medicine dietary recipes for her.  Her labs are all stable today.  She will proceed with her fourth treatment.  She knows to call for any questions or concerns.  She will proceed with radiation after this and her appointments are already set up.  We will see her back in 8 weeks as it will be time for her to start antiestrogen therapy around then.

## 2022-08-28 NOTE — Progress Notes (Signed)
Placed referral for dermatology.  Confirmed faxed info to [facility] dermatology (Patients preference).  Pt has appt in August.

## 2022-08-29 ENCOUNTER — Other Ambulatory Visit: Payer: Self-pay

## 2022-08-30 ENCOUNTER — Inpatient Hospital Stay

## 2022-08-30 ENCOUNTER — Other Ambulatory Visit: Payer: Self-pay

## 2022-08-30 VITALS — BP 123/69 | HR 87 | Temp 98.0°F | Resp 18

## 2022-08-30 DIAGNOSIS — C50212 Malignant neoplasm of upper-inner quadrant of left female breast: Secondary | ICD-10-CM

## 2022-08-30 DIAGNOSIS — Z5111 Encounter for antineoplastic chemotherapy: Secondary | ICD-10-CM | POA: Diagnosis not present

## 2022-08-30 MED ORDER — PEGFILGRASTIM-CBQV 6 MG/0.6ML ~~LOC~~ SOSY
6.0000 mg | PREFILLED_SYRINGE | Freq: Once | SUBCUTANEOUS | Status: AC
  Administered 2022-08-30: 6 mg via SUBCUTANEOUS
  Filled 2022-08-30: qty 0.6

## 2022-09-08 IMAGING — MG MM DIGITAL SCREENING BILAT W/ TOMO AND CAD
8 series · 8 of 24 positions shown · non-contrast
Comparison: Previous exam(s).

CLINICAL DATA: Screening.

EXAM:
DIGITAL SCREENING BILATERAL MAMMOGRAM WITH TOMOSYNTHESIS AND CAD
TECHNIQUE: Bilateral screening digital craniocaudal and mediolateral oblique
mammograms were obtained. Bilateral screening digital breast
tomosynthesis was performed. The images were evaluated with
computer-aided detection.

[R CC synth-2D]
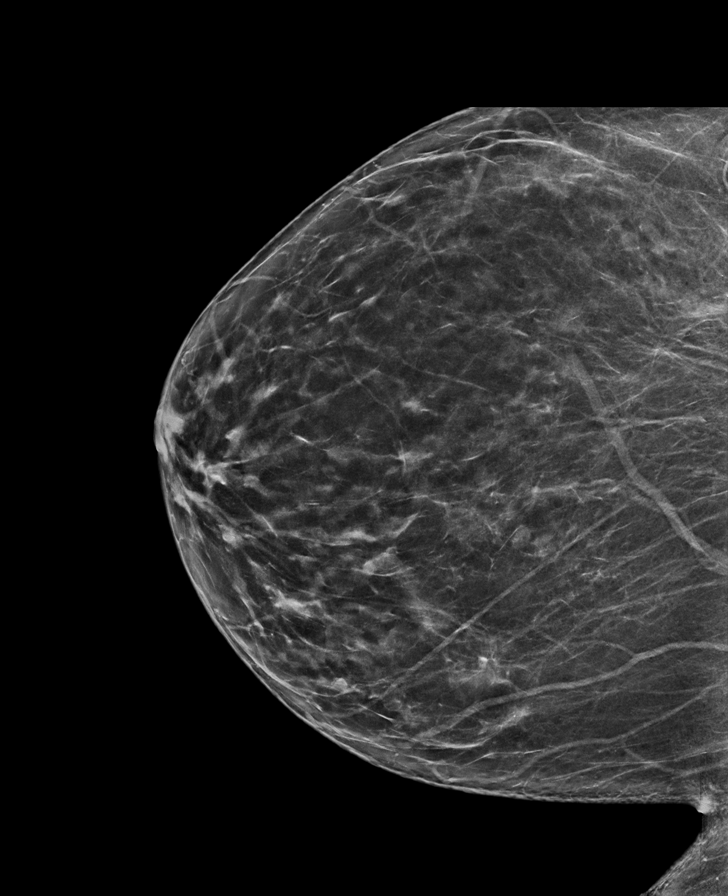

[L MLO synth-2D]
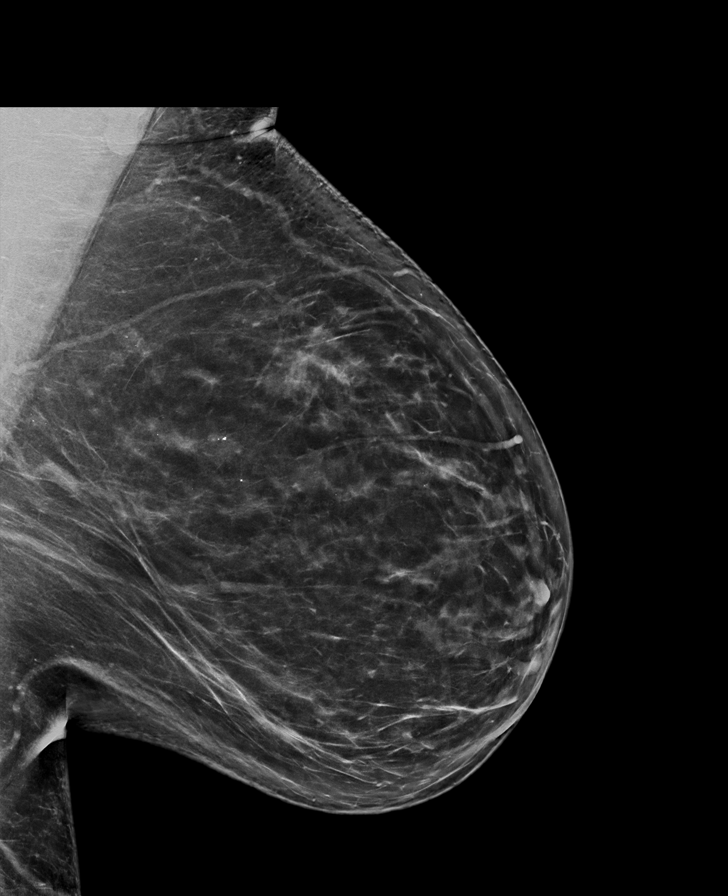

[R MLO synth-2D]
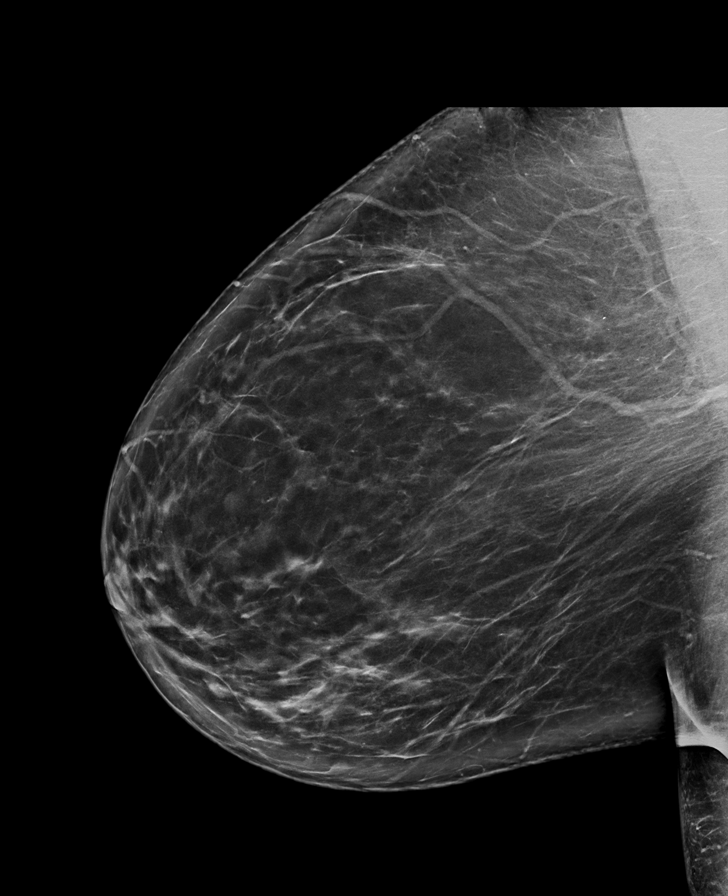

[L CC synth-2D]
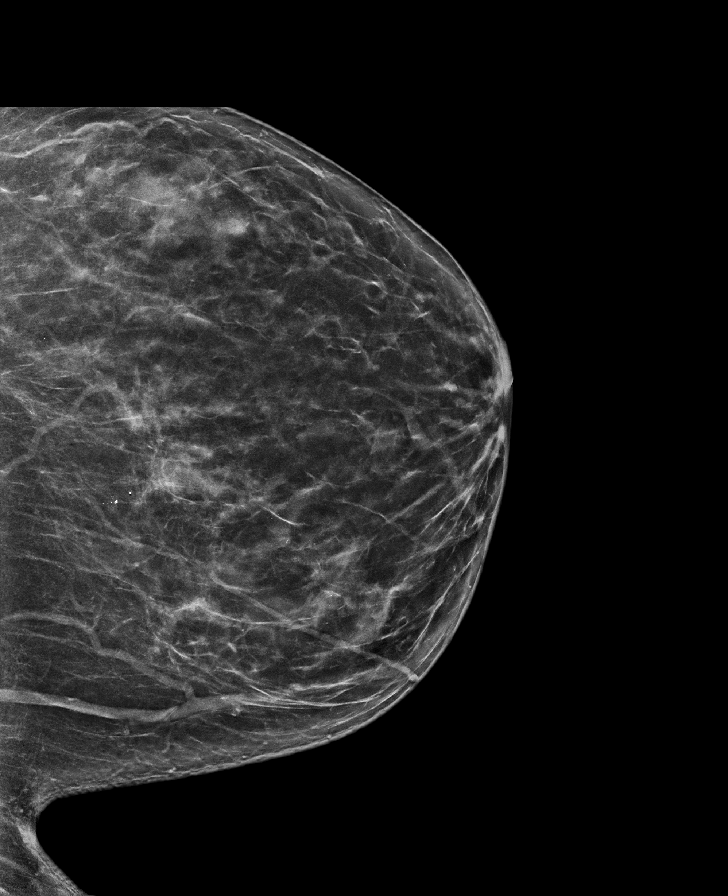

[L MLO tomo · tomo slice 47/93.0]
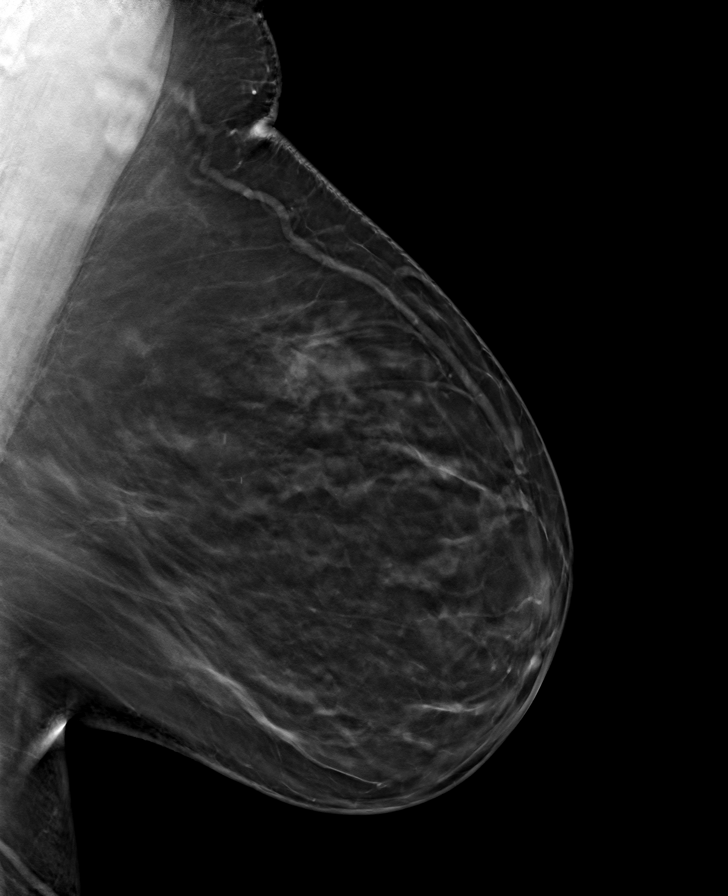

[R MLO tomo · tomo slice 47/92.0]
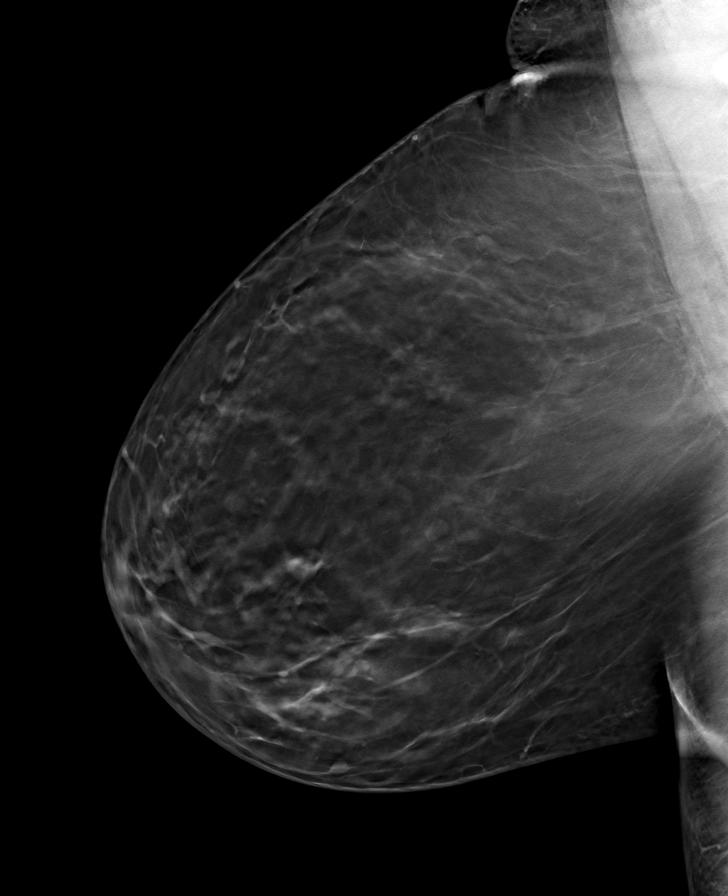

[L CC tomo · tomo slice 37/74.0]
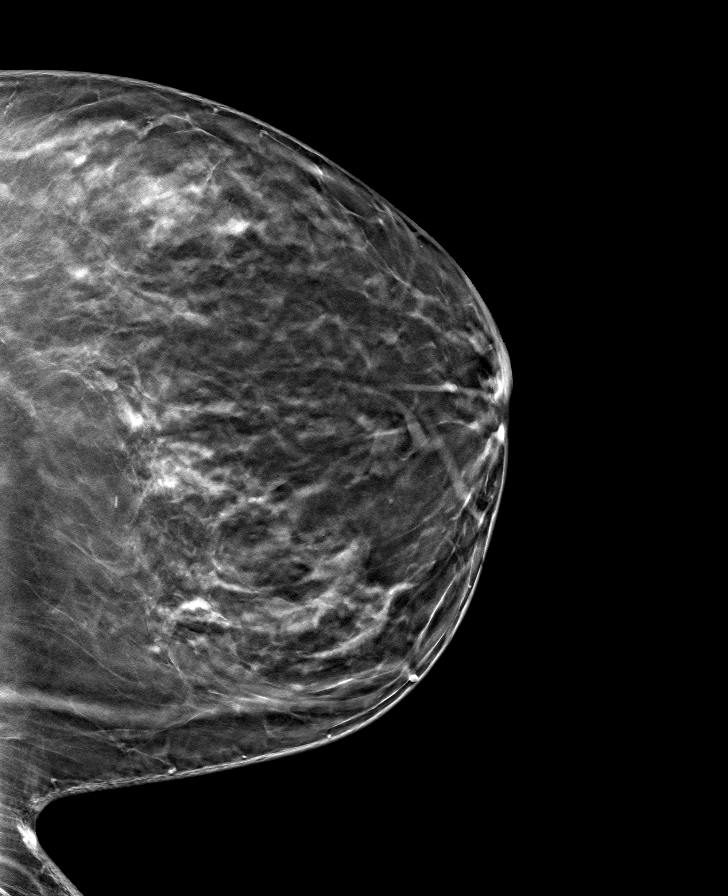

[R CC tomo · tomo slice 39/76.0]
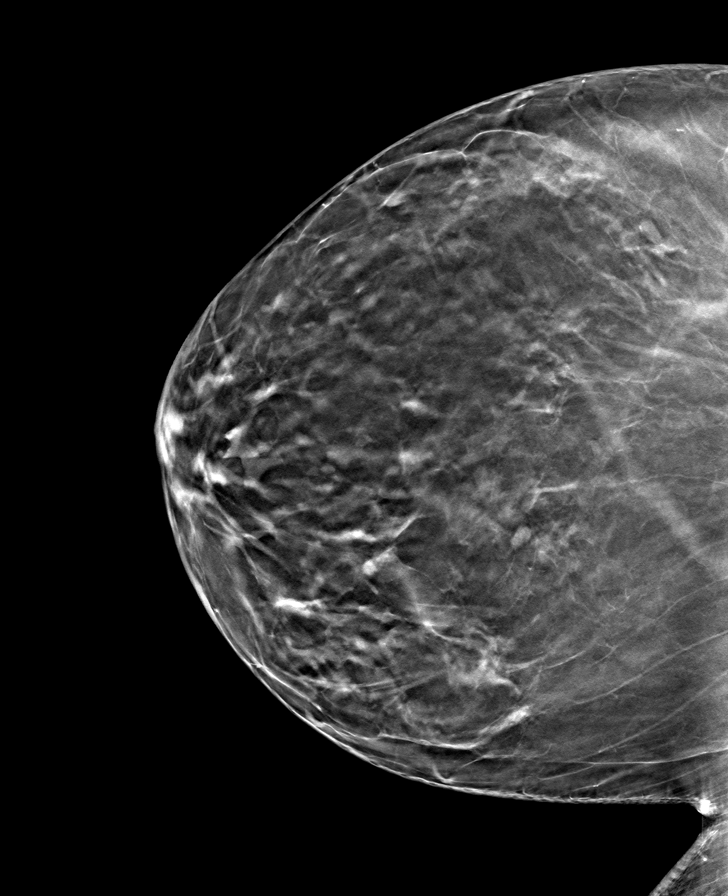

[8 of 24 positions shown; findings below may reference images not displayed]

ACR Breast Density Category b: There are scattered areas of
fibroglandular density.
FINDINGS: There are no findings suspicious for malignancy.
IMPRESSION: No mammographic evidence of malignancy. A result letter of this
screening mammogram will be mailed directly to the patient.

RECOMMENDATION:
Screening mammogram in one year. (Code:51-O-LD2)

BI-RADS CATEGORY  1: Negative.

## 2022-09-09 ENCOUNTER — Ambulatory Visit
Admission: RE | Admit: 2022-09-09 | Discharge: 2022-09-09 | Disposition: A | Discharge status: Home / Self Care | Source: Ambulatory Visit | Attending: Adult Health | Admitting: Adult Health

## 2022-09-09 ENCOUNTER — Ambulatory Visit: Admission: RE | Admit: 2022-09-09 | Source: Ambulatory Visit

## 2022-09-09 ENCOUNTER — Other Ambulatory Visit: Payer: Self-pay | Admitting: Adult Health

## 2022-09-09 DIAGNOSIS — Z170 Estrogen receptor positive status [ER+]: Secondary | ICD-10-CM

## 2022-09-09 DIAGNOSIS — N63 Unspecified lump in unspecified breast: Secondary | ICD-10-CM

## 2022-09-09 NOTE — Progress Notes (Signed)
Radiation Oncology         [phone] ________________________________  Outpatient Follow Up  Name: Isabel [name]        MRN: [id number]  Date of Service: 09/12/2022 DOB: [birth year]  WG:[id number], Austin Miles, MD  Rachel Moulds, MD     REFERRING PHYSICIAN: Rachel Moulds, MD   DIAGNOSIS: The encounter diagnosis was Malignant neoplasm of upper-inner quadrant of left breast in female, estrogen receptor positive (HCC).   HISTORY OF PRESENT ILLNESS: Isabel [name] is a 62 y.o. female seen at the request of Dr. Corliss Skains for a new diagnosis of left breast cancer. The patient was noted to have screening detected asymmetry in the upper inner quadrant of the left breast. On 04/18/22 an ultrasound was performed and there was no sonographic correlate, and her axilla on the left was negative. 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 Her invasive cancer was ER positive, PR negative, HER2 negative with a Ki 67 of 10%. The biopsy volume of carcinoma was 7 mm in greatest extent.  Since her last visit, she underwent a left lumpectomy on 05/28/22 with sentinel node biopsy which showed no residual carcinoma in the lumpectomy or additional posterior margin excision. Two nodes were negative for disease. She had oncotype dx score of 36 however and began chemotherapy between 06/27/22 and 08/28/22. She's seen today to discuss radiation treatment recommendations of her cancer.  PREVIOUS RADIATION THERAPY: No   PAST MEDICAL HISTORY:  Past Medical History:  Diagnosis Date   Breast cancer (HCC) 05/01/2022   GERD (gastroesophageal reflux disease)    HYPERGLYCEMIA    OBESITY    Pre-diabetes        PAST SURGICAL HISTORY: Past Surgical History:  Procedure Laterality Date   AXILLARY SENTINEL NODE BIOPSY Left 05/28/2022   Procedure: LEFT AXILLARY SENTINEL NODE BIOPSY;  Surgeon: Manus Rudd, MD;  Location: [facility] OR;   Service: General;  Laterality: Left;   BREAST BIOPSY Left 04/30/2022   MM LT BREAST BX W LOC DEV 1ST LESION IMAGE BX SPEC STEREO GUIDE 04/30/2022 [facility]   BREAST BIOPSY  05/27/2022   MM LT RADIOACTIVE SEED LOC MAMMO GUIDE 05/27/2022 [facility]   BREAST LUMPECTOMY WITH RADIOACTIVE SEED AND SENTINEL LYMPH NODE BIOPSY Left 05/28/2022   Procedure: LEFT BREAST LUMPECTOMY WITH RADIOACTIVE SEED;  Surgeon: Manus Rudd, MD;  Location: [facility] OR;  Service: General;  Laterality: Left;   CESAREAN SECTION  1992   LAPAROSCOPIC CHOLECYSTECTOMY  2005     FAMILY HISTORY:  Family History  Problem Relation Age of Onset   Prostate cancer Father    Breast cancer Maternal Aunt 55   Stroke Maternal Grandmother    Hypertension Brother    Kidney disease Brother    Colon cancer Neg Hx    Stomach cancer Neg Hx      SOCIAL HISTORY:  reports that she has never smoked. She has never used smokeless tobacco. She reports that she does not currently use alcohol after a past usage of about 2.0 standard drinks of alcohol per week. She reports that she does not use drugs. The patient is married and lives in [place]. She is hoping to go back to work after radiation is over and looking at a part time setting. She has a young 33 year old niece who lives with her.    ALLERGIES: Patient has no known allergies.   MEDICATIONS:  Current Outpatient Medications  Medication Sig Dispense Refill   aspirin EC 81 MG tablet Take 81 mg by mouth daily.     Calcium Carb-Cholecalciferol (CALCIUM-VITAMIN D) 500-200 MG-UNIT tablet Take 1 tablet by mouth daily.     dexamethasone (DECADRON) 4 MG tablet Take 2 tabs by mouth 2 times daily starting day before chemo. Then take 2 tabs daily for 2 days starting day after chemo. Take with food. 30 tablet 1   Multiple Vitamins-Calcium (ONE-A-DAY WOMENS FORMULA) TABS Take 1 tablet by mouth daily.     Multiple Vitamins-Minerals (EMERGEN-C VITAMIN C LITE) PACK Take by mouth.      Omega-3 Fatty Acids (FISH OIL) 1000 MG CAPS Take 1,000 mg by mouth daily.     ondansetron (ZOFRAN) 8 MG tablet Take 1 tablet (8 mg total) by mouth every 8 (eight) hours as needed for nausea or vomiting. Start on the third day after chemotherapy. 30 tablet 1   prochlorperazine (COMPAZINE) 10 MG tablet Take 1 tablet (10 mg total) by mouth every 6 (six) hours as needed for nausea or vomiting. 30 tablet 1   VITAJOY BIOTIN GUMMIES PO Take by mouth daily.     VITAMIN E PO Take 1 capsule by mouth daily.     No current facility-administered medications for this visit.     REVIEW OF SYSTEMS: On review of systems, the patient reports that she is doing ***     PHYSICAL EXAM:  Unable to assess due to encounter type    ECOG = 1  0 - Asymptomatic (Fully active, able to carry on all predisease activities without restriction)  1 - Symptomatic but completely ambulatory (Restricted in physically strenuous activity but ambulatory and able to carry out work of a light or sedentary nature. For example, light housework, office work)  2 - Symptomatic, <50% in bed during the day (Ambulatory and capable of all self care but unable to carry out any work activities. Up and about more than 50% of waking hours)  3 - Symptomatic, >50% in bed, but not bedbound (Capable of only limited self-care, confined to bed or chair 50% or more of waking hours)  4 - Bedbound (Completely disabled. Cannot carry on any self-care. Totally confined to bed or chair)  5 - Death   Santiago Glad MM, Creech RH, Tormey DC, et al. [phone]). "Toxicity and response criteria of the [facility]". Am. Evlyn Clines. Oncol. 5 (6): 649-55    LABORATORY DATA:  Lab Results  Component Value Date   WBC 10.1 08/28/2022   HGB 12.2 08/28/2022   HCT 35.6 (L) 08/28/2022   MCV 82.0 08/28/2022   PLT 315 08/28/2022   Lab Results  Component Value Date   NA 140 08/28/2022   K 3.9 08/28/2022   CL 107 08/28/2022   CO2 23 08/28/2022    Lab Results  Component Value Date   ALT 19 08/28/2022   AST 17 08/28/2022   ALKPHOS 68 08/28/2022   BILITOT 0.4 08/28/2022      RADIOGRAPHY: No results found.     IMPRESSION/PLAN: 1. Stage IA, [id number], grade 2, ER positive, invasive ductal carcinoma of the left breast with no residual disease at the time of lumpectomy. Dr. Mitzi Hansen has reviewed her final pathology findings and we discussed the rationale of using external radiotherapy to the breast  to reduce risks of local recurrence followed by antiestrogen therapy. We discussed the risks, benefits, short, and long term effects of radiotherapy, as well as the curative intent, and the patient is  interested in proceeding. Dr. Mitzi Hansen discusses the delivery and logistics of radiotherapy and anticipates a course of 4  weeks of radiotherapy to the left breast with deep inspiration breath hold technique. Written consent is obtained and placed in the chart, a copy was provided to the patient. She will simulate tomorrow and the soonest she would start therapy would be 09/25/22 based on her chemotherapy treatment dates.    In a visit lasting *** minutes, greater than 50% of the time was spent face to face discussing the patient's condition, in preparation for the discussion, and coordinating the patient's care.    The above documentation reflects my direct findings during this shared patient visit. Please see the separate note by Dr. Mitzi Hansen on this date for the remainder of the patient's plan of care.    Osker Mason, [facility]    **Disclaimer: This note was dictated with voice recognition software. Similar sounding words can inadvertently be transcribed and this note may contain transcription errors which may not have been corrected upon publication of note.**

## 2022-09-12 ENCOUNTER — Encounter: Payer: Self-pay | Admitting: Radiation Oncology

## 2022-09-12 ENCOUNTER — Ambulatory Visit
Admission: RE | Admit: 2022-09-12 | Discharge: 2022-09-12 | Disposition: A | Discharge status: Home / Self Care | Source: Ambulatory Visit | Attending: Radiation Oncology | Admitting: Radiation Oncology

## 2022-09-12 ENCOUNTER — Ambulatory Visit: Attending: Surgery | Admitting: Rehabilitation

## 2022-09-12 VITALS — Ht 66.0 in | Wt 207.0 lb

## 2022-09-12 DIAGNOSIS — C50212 Malignant neoplasm of upper-inner quadrant of left female breast: Secondary | ICD-10-CM | POA: Insufficient documentation

## 2022-09-12 DIAGNOSIS — Z170 Estrogen receptor positive status [ER+]: Secondary | ICD-10-CM

## 2022-09-12 DIAGNOSIS — Z510 Encounter for antineoplastic radiation therapy: Secondary | ICD-10-CM | POA: Insufficient documentation

## 2022-09-12 DIAGNOSIS — Z483 Aftercare following surgery for neoplasm: Secondary | ICD-10-CM | POA: Insufficient documentation

## 2022-09-12 NOTE — Therapy (Signed)
  OUTPATIENT PHYSICAL THERAPY SOZO SCREENING NOTE   Patient Name: Isabel [name] MRN: [id number] DOB:10/21/1960, 62 y.o., female Today's Date: 09/12/2022  PCP: [name], MD REFERRING PROVIDER: Manus Rudd, MD   PT End of Session - 09/12/22 0925     Visit Number 2   screen   PT Start Time 0919    PT Stop Time 0926    PT Time Calculation (min) 7 min    Activity Tolerance Patient tolerated treatment well    Behavior During Therapy [facility] for tasks assessed/performed             Past Medical History:  Diagnosis Date   Breast cancer (HCC) 05/01/2022   GERD (gastroesophageal reflux disease)    HYPERGLYCEMIA    OBESITY    Pre-diabetes    Past Surgical History:  Procedure Laterality Date   AXILLARY SENTINEL NODE BIOPSY Left 05/28/2022   Procedure: LEFT AXILLARY SENTINEL NODE BIOPSY;  Surgeon: Manus Rudd, MD;  Location: [facility] OR;  Service: General;  Laterality: Left;   BREAST BIOPSY Left 04/30/2022   MM LT BREAST BX W LOC DEV 1ST LESION IMAGE BX SPEC STEREO GUIDE 04/30/2022 [facility]   BREAST BIOPSY  05/27/2022   MM LT RADIOACTIVE SEED LOC MAMMO GUIDE 05/27/2022 [facility]   BREAST LUMPECTOMY WITH RADIOACTIVE SEED AND SENTINEL LYMPH NODE BIOPSY Left 05/28/2022   Procedure: LEFT BREAST LUMPECTOMY WITH RADIOACTIVE SEED;  Surgeon: Manus Rudd, MD;  Location: [facility];  Service: General;  Laterality: Left;   CESAREAN SECTION  1992   LAPAROSCOPIC CHOLECYSTECTOMY  2005   Patient Active Problem List   Diagnosis Date Noted   Malignant neoplasm of upper-inner quadrant of left breast in female, estrogen receptor positive (HCC) 05/13/2022   Pre-diabetes 04/01/2022   Routine general medical examination at a health care facility 09/29/2014   Obesity (BMI 35.0-39.9 without comorbidity) 04/18/2009    REFERRING DIAG: left breast cancer at risk for lymphedema  THERAPY DIAG:  Malignant neoplasm of upper-inner quadrant of left breast in female, estrogen receptor  positive (HCC)  Aftercare following surgery for neoplasm  PERTINENT HISTORY: lumpectomy Lt 05/28/22  PRECAUTIONS: left UE Lymphedema risk  SUBJECTIVE: starts radiation soon  PAIN:  Are you having pain? No  SOZO SCREENING: Patient was assessed today using the SOZO machine to determine the lymphedema index score. This was compared to her baseline score. It was determined that she is within the recommended range when compared to her baseline and no further action is needed at this time. She will continue SOZO screenings. These are done every 3 months for 2 years post operatively followed by every 6 months for 2 years, and then annually.     [name], PT 09/12/2022, 9:26 AM

## 2022-09-12 NOTE — Progress Notes (Signed)
Nursing interview for Malignant neoplasm of upper-inner quadrant of left breast in female, estrogen receptor positive (HCC).   Patient identity verified x2.  Patient reports LT breast tenderness 1/10 at incision line, but healing well. No other issues conveyed at this time.  Meaningful use complete.  Vitals- Ht 5\' 6"  (1.676 m)   Wt 207 lb (93.9 kg)   LMP 11/22/2011   BMI 33.41 kg/m   This concludes the interaction.  Ruel Favors, LPN

## 2022-09-13 ENCOUNTER — Ambulatory Visit: Admitting: Radiation Oncology

## 2022-09-13 ENCOUNTER — Other Ambulatory Visit: Payer: Self-pay

## 2022-09-19 DIAGNOSIS — Z510 Encounter for antineoplastic radiation therapy: Secondary | ICD-10-CM | POA: Diagnosis not present

## 2022-09-20 ENCOUNTER — Encounter: Payer: Self-pay | Admitting: *Deleted

## 2022-09-20 DIAGNOSIS — C50212 Malignant neoplasm of upper-inner quadrant of left female breast: Secondary | ICD-10-CM

## 2022-09-22 ENCOUNTER — Other Ambulatory Visit: Payer: Self-pay

## 2022-09-25 ENCOUNTER — Other Ambulatory Visit: Payer: Self-pay

## 2022-09-25 ENCOUNTER — Ambulatory Visit
Admission: RE | Admit: 2022-09-25 | Discharge: 2022-09-25 | Disposition: A | Discharge status: Home / Self Care | Source: Ambulatory Visit | Attending: Radiation Oncology | Admitting: Radiation Oncology

## 2022-09-25 DIAGNOSIS — Z510 Encounter for antineoplastic radiation therapy: Secondary | ICD-10-CM | POA: Diagnosis not present

## 2022-09-25 LAB — RAD ONC ARIA SESSION SUMMARY
Course Elapsed Days: 0
Plan Fractions Treated to Date: 1
Plan Prescribed Dose Per Fraction: 2.66 Gy
Plan Total Fractions Prescribed: 16
Plan Total Prescribed Dose: 42.56 Gy
Reference Point Dosage Given to Date: 2.66 Gy
Reference Point Session Dosage Given: 2.66 Gy
Session Number: 1

## 2022-09-26 ENCOUNTER — Ambulatory Visit
Admission: RE | Admit: 2022-09-26 | Discharge: 2022-09-26 | Disposition: A | Discharge status: Home / Self Care | Source: Ambulatory Visit | Attending: Radiation Oncology | Admitting: Radiation Oncology

## 2022-09-26 ENCOUNTER — Other Ambulatory Visit: Payer: Self-pay

## 2022-09-26 DIAGNOSIS — Z510 Encounter for antineoplastic radiation therapy: Secondary | ICD-10-CM | POA: Diagnosis not present

## 2022-09-26 LAB — RAD ONC ARIA SESSION SUMMARY
Course Elapsed Days: 1
Plan Fractions Treated to Date: 2
Plan Prescribed Dose Per Fraction: 2.66 Gy
Plan Total Fractions Prescribed: 16
Plan Total Prescribed Dose: 42.56 Gy
Reference Point Dosage Given to Date: 5.32 Gy
Reference Point Session Dosage Given: 2.66 Gy
Session Number: 2

## 2022-09-27 ENCOUNTER — Ambulatory Visit
Admission: RE | Admit: 2022-09-27 | Discharge: 2022-09-27 | Disposition: A | Discharge status: Home / Self Care | Source: Ambulatory Visit | Attending: Radiation Oncology | Admitting: Radiation Oncology

## 2022-09-27 ENCOUNTER — Ambulatory Visit: Admission: RE | Admit: 2022-09-27 | Source: Ambulatory Visit

## 2022-09-27 ENCOUNTER — Other Ambulatory Visit: Payer: Self-pay

## 2022-09-27 DIAGNOSIS — Z170 Estrogen receptor positive status [ER+]: Secondary | ICD-10-CM

## 2022-09-27 DIAGNOSIS — Z510 Encounter for antineoplastic radiation therapy: Secondary | ICD-10-CM | POA: Diagnosis not present

## 2022-09-27 LAB — RAD ONC ARIA SESSION SUMMARY
Course Elapsed Days: 2
Plan Fractions Treated to Date: 3
Plan Prescribed Dose Per Fraction: 2.66 Gy
Plan Total Fractions Prescribed: 16
Plan Total Prescribed Dose: 42.56 Gy
Reference Point Dosage Given to Date: 7.98 Gy
Reference Point Session Dosage Given: 2.66 Gy
Session Number: 3

## 2022-09-27 MED ORDER — RADIAPLEXRX EX GEL: Freq: Once | CUTANEOUS | Status: AC

## 2022-09-27 MED ORDER — ALRA NON-METALLIC DEODORANT (RAD-ONC)
1.0000 | Freq: Once | TOPICAL | Status: AC
  Administered 2022-09-27: 1 via TOPICAL

## 2022-09-30 ENCOUNTER — Ambulatory Visit: Admission: RE | Admit: 2022-09-30 | Source: Ambulatory Visit

## 2022-09-30 ENCOUNTER — Other Ambulatory Visit: Payer: Self-pay

## 2022-09-30 DIAGNOSIS — Z510 Encounter for antineoplastic radiation therapy: Secondary | ICD-10-CM | POA: Diagnosis not present

## 2022-09-30 LAB — RAD ONC ARIA SESSION SUMMARY
Course Elapsed Days: 5
Plan Fractions Treated to Date: 4
Plan Prescribed Dose Per Fraction: 2.66 Gy
Plan Total Fractions Prescribed: 16
Plan Total Prescribed Dose: 42.56 Gy
Reference Point Dosage Given to Date: 10.64 Gy
Reference Point Session Dosage Given: 2.66 Gy
Session Number: 4

## 2022-10-01 ENCOUNTER — Ambulatory Visit
Admission: RE | Admit: 2022-10-01 | Discharge: 2022-10-01 | Disposition: A | Discharge status: Home / Self Care | Source: Ambulatory Visit | Attending: Radiation Oncology | Admitting: Radiation Oncology

## 2022-10-01 ENCOUNTER — Other Ambulatory Visit: Payer: Self-pay

## 2022-10-01 DIAGNOSIS — Z510 Encounter for antineoplastic radiation therapy: Secondary | ICD-10-CM | POA: Diagnosis not present

## 2022-10-01 LAB — RAD ONC ARIA SESSION SUMMARY
Course Elapsed Days: 6
Plan Fractions Treated to Date: 5
Plan Prescribed Dose Per Fraction: 2.66 Gy
Plan Total Fractions Prescribed: 16
Plan Total Prescribed Dose: 42.56 Gy
Reference Point Dosage Given to Date: 13.3 Gy
Reference Point Session Dosage Given: 2.66 Gy
Session Number: 5

## 2022-10-02 ENCOUNTER — Ambulatory Visit
Admission: RE | Admit: 2022-10-02 | Discharge: 2022-10-02 | Disposition: A | Discharge status: Home / Self Care | Source: Ambulatory Visit | Attending: Radiation Oncology | Admitting: Radiation Oncology

## 2022-10-02 ENCOUNTER — Other Ambulatory Visit: Payer: Self-pay

## 2022-10-02 DIAGNOSIS — Z510 Encounter for antineoplastic radiation therapy: Secondary | ICD-10-CM | POA: Diagnosis not present

## 2022-10-02 LAB — RAD ONC ARIA SESSION SUMMARY
Course Elapsed Days: 7
Plan Fractions Treated to Date: 6
Plan Prescribed Dose Per Fraction: 2.66 Gy
Plan Total Fractions Prescribed: 16
Plan Total Prescribed Dose: 42.56 Gy
Reference Point Dosage Given to Date: 15.96 Gy
Reference Point Session Dosage Given: 2.66 Gy
Session Number: 6

## 2022-10-03 ENCOUNTER — Ambulatory Visit
Admission: RE | Admit: 2022-10-03 | Discharge: 2022-10-03 | Disposition: A | Discharge status: Home / Self Care | Source: Ambulatory Visit | Attending: Radiation Oncology | Admitting: Radiation Oncology

## 2022-10-03 ENCOUNTER — Other Ambulatory Visit: Payer: Self-pay

## 2022-10-03 DIAGNOSIS — Z510 Encounter for antineoplastic radiation therapy: Secondary | ICD-10-CM | POA: Diagnosis not present

## 2022-10-03 LAB — RAD ONC ARIA SESSION SUMMARY
Course Elapsed Days: 8
Plan Fractions Treated to Date: 7
Plan Prescribed Dose Per Fraction: 2.66 Gy
Plan Total Fractions Prescribed: 16
Plan Total Prescribed Dose: 42.56 Gy
Reference Point Dosage Given to Date: 18.62 Gy
Reference Point Session Dosage Given: 2.66 Gy
Session Number: 7

## 2022-10-04 ENCOUNTER — Ambulatory Visit
Admission: RE | Admit: 2022-10-04 | Discharge: 2022-10-04 | Disposition: A | Discharge status: Home / Self Care | Source: Ambulatory Visit | Attending: Radiation Oncology | Admitting: Radiation Oncology

## 2022-10-04 ENCOUNTER — Other Ambulatory Visit: Payer: Self-pay

## 2022-10-04 DIAGNOSIS — Z510 Encounter for antineoplastic radiation therapy: Secondary | ICD-10-CM | POA: Diagnosis not present

## 2022-10-04 LAB — RAD ONC ARIA SESSION SUMMARY
Course Elapsed Days: 9
Plan Fractions Treated to Date: 8
Plan Prescribed Dose Per Fraction: 2.66 Gy
Plan Total Fractions Prescribed: 16
Plan Total Prescribed Dose: 42.56 Gy
Reference Point Dosage Given to Date: 21.28 Gy
Reference Point Session Dosage Given: 2.66 Gy
Session Number: 8

## 2022-10-07 ENCOUNTER — Ambulatory Visit
Admission: RE | Admit: 2022-10-07 | Discharge: 2022-10-07 | Disposition: A | Discharge status: Home / Self Care | Source: Ambulatory Visit | Attending: Radiation Oncology | Admitting: Radiation Oncology

## 2022-10-07 DIAGNOSIS — Z510 Encounter for antineoplastic radiation therapy: Secondary | ICD-10-CM | POA: Diagnosis present

## 2022-10-07 DIAGNOSIS — C50212 Malignant neoplasm of upper-inner quadrant of left female breast: Secondary | ICD-10-CM | POA: Insufficient documentation

## 2022-10-08 ENCOUNTER — Other Ambulatory Visit: Payer: Self-pay

## 2022-10-08 DIAGNOSIS — Z510 Encounter for antineoplastic radiation therapy: Secondary | ICD-10-CM | POA: Diagnosis not present

## 2022-10-08 LAB — RAD ONC ARIA SESSION SUMMARY
Course Elapsed Days: 13
Plan Fractions Treated to Date: 9
Plan Prescribed Dose Per Fraction: 2.66 Gy
Plan Total Fractions Prescribed: 16
Plan Total Prescribed Dose: 42.56 Gy
Reference Point Dosage Given to Date: 23.94 Gy
Reference Point Session Dosage Given: 2.66 Gy
Session Number: 9

## 2022-10-09 ENCOUNTER — Ambulatory Visit
Admission: RE | Admit: 2022-10-09 | Discharge: 2022-10-09 | Disposition: A | Discharge status: Home / Self Care | Source: Ambulatory Visit | Attending: Radiation Oncology | Admitting: Radiation Oncology

## 2022-10-09 ENCOUNTER — Other Ambulatory Visit: Payer: Self-pay

## 2022-10-09 DIAGNOSIS — Z510 Encounter for antineoplastic radiation therapy: Secondary | ICD-10-CM | POA: Diagnosis not present

## 2022-10-09 LAB — RAD ONC ARIA SESSION SUMMARY
Course Elapsed Days: 14
Plan Fractions Treated to Date: 10
Plan Prescribed Dose Per Fraction: 2.66 Gy
Plan Total Fractions Prescribed: 16
Plan Total Prescribed Dose: 42.56 Gy
Reference Point Dosage Given to Date: 26.6 Gy
Reference Point Session Dosage Given: 2.66 Gy
Session Number: 10

## 2022-10-10 ENCOUNTER — Other Ambulatory Visit: Payer: Self-pay

## 2022-10-10 ENCOUNTER — Ambulatory Visit
Admission: RE | Admit: 2022-10-10 | Discharge: 2022-10-10 | Disposition: A | Discharge status: Home / Self Care | Source: Ambulatory Visit | Attending: Radiation Oncology | Admitting: Radiation Oncology

## 2022-10-10 DIAGNOSIS — Z510 Encounter for antineoplastic radiation therapy: Secondary | ICD-10-CM | POA: Diagnosis not present

## 2022-10-10 LAB — RAD ONC ARIA SESSION SUMMARY
Course Elapsed Days: 15
Plan Fractions Treated to Date: 11
Plan Prescribed Dose Per Fraction: 2.66 Gy
Plan Total Fractions Prescribed: 16
Plan Total Prescribed Dose: 42.56 Gy
Reference Point Dosage Given to Date: 29.26 Gy
Reference Point Session Dosage Given: 2.66 Gy
Session Number: 11

## 2022-10-11 ENCOUNTER — Ambulatory Visit: Admitting: Radiation Oncology

## 2022-10-11 ENCOUNTER — Ambulatory Visit
Admission: RE | Admit: 2022-10-11 | Discharge: 2022-10-11 | Disposition: A | Discharge status: Home / Self Care | Source: Ambulatory Visit | Attending: Radiation Oncology | Admitting: Radiation Oncology

## 2022-10-11 ENCOUNTER — Other Ambulatory Visit: Payer: Self-pay

## 2022-10-11 ENCOUNTER — Ambulatory Visit
Admission: RE | Admit: 2022-10-11 | Discharge: 2022-10-11 | Disposition: A | Discharge status: Home / Self Care | Source: Ambulatory Visit | Attending: Radiation Oncology

## 2022-10-11 DIAGNOSIS — Z510 Encounter for antineoplastic radiation therapy: Secondary | ICD-10-CM | POA: Diagnosis not present

## 2022-10-11 LAB — RAD ONC ARIA SESSION SUMMARY
Course Elapsed Days: 16
Plan Fractions Treated to Date: 12
Plan Prescribed Dose Per Fraction: 2.66 Gy
Plan Total Fractions Prescribed: 16
Plan Total Prescribed Dose: 42.56 Gy
Reference Point Dosage Given to Date: 31.92 Gy
Reference Point Session Dosage Given: 2.66 Gy
Session Number: 12

## 2022-10-14 ENCOUNTER — Other Ambulatory Visit: Payer: Self-pay

## 2022-10-14 ENCOUNTER — Ambulatory Visit
Admission: RE | Admit: 2022-10-14 | Discharge: 2022-10-14 | Disposition: A | Discharge status: Home / Self Care | Source: Ambulatory Visit | Attending: Radiation Oncology

## 2022-10-14 DIAGNOSIS — Z510 Encounter for antineoplastic radiation therapy: Secondary | ICD-10-CM | POA: Diagnosis not present

## 2022-10-14 LAB — RAD ONC ARIA SESSION SUMMARY
Course Elapsed Days: 19
Plan Fractions Treated to Date: 13
Plan Prescribed Dose Per Fraction: 2.66 Gy
Plan Total Fractions Prescribed: 16
Plan Total Prescribed Dose: 42.56 Gy
Reference Point Dosage Given to Date: 34.58 Gy
Reference Point Session Dosage Given: 2.66 Gy
Session Number: 13

## 2022-10-15 ENCOUNTER — Ambulatory Visit
Admission: RE | Admit: 2022-10-15 | Discharge: 2022-10-15 | Disposition: A | Discharge status: Home / Self Care | Source: Ambulatory Visit | Attending: Radiation Oncology | Admitting: Radiation Oncology

## 2022-10-15 ENCOUNTER — Other Ambulatory Visit: Payer: Self-pay

## 2022-10-15 DIAGNOSIS — Z510 Encounter for antineoplastic radiation therapy: Secondary | ICD-10-CM | POA: Diagnosis not present

## 2022-10-15 LAB — RAD ONC ARIA SESSION SUMMARY
Course Elapsed Days: 20
Plan Fractions Treated to Date: 14
Plan Prescribed Dose Per Fraction: 2.66 Gy
Plan Total Fractions Prescribed: 16
Plan Total Prescribed Dose: 42.56 Gy
Reference Point Dosage Given to Date: 37.24 Gy
Reference Point Session Dosage Given: 2.66 Gy
Session Number: 14

## 2022-10-16 ENCOUNTER — Other Ambulatory Visit: Payer: Self-pay

## 2022-10-16 ENCOUNTER — Ambulatory Visit
Admission: RE | Admit: 2022-10-16 | Discharge: 2022-10-16 | Disposition: A | Discharge status: Home / Self Care | Source: Ambulatory Visit | Attending: Radiation Oncology

## 2022-10-16 DIAGNOSIS — Z510 Encounter for antineoplastic radiation therapy: Secondary | ICD-10-CM | POA: Diagnosis not present

## 2022-10-16 LAB — RAD ONC ARIA SESSION SUMMARY
Course Elapsed Days: 21
Plan Fractions Treated to Date: 15
Plan Prescribed Dose Per Fraction: 2.66 Gy
Plan Total Fractions Prescribed: 16
Plan Total Prescribed Dose: 42.56 Gy
Reference Point Dosage Given to Date: 39.9 Gy
Reference Point Session Dosage Given: 2.66 Gy
Session Number: 15

## 2022-10-17 ENCOUNTER — Other Ambulatory Visit: Payer: Self-pay

## 2022-10-17 ENCOUNTER — Ambulatory Visit
Admission: RE | Admit: 2022-10-17 | Discharge: 2022-10-17 | Disposition: A | Discharge status: Home / Self Care | Source: Ambulatory Visit | Attending: Radiation Oncology | Admitting: Radiation Oncology

## 2022-10-17 DIAGNOSIS — Z510 Encounter for antineoplastic radiation therapy: Secondary | ICD-10-CM | POA: Diagnosis not present

## 2022-10-17 LAB — RAD ONC ARIA SESSION SUMMARY
Course Elapsed Days: 22
Plan Fractions Treated to Date: 16
Plan Prescribed Dose Per Fraction: 2.66 Gy
Plan Total Fractions Prescribed: 16
Plan Total Prescribed Dose: 42.56 Gy
Reference Point Dosage Given to Date: 42.56 Gy
Reference Point Session Dosage Given: 2.66 Gy
Session Number: 16

## 2022-10-18 ENCOUNTER — Ambulatory Visit
Admission: RE | Admit: 2022-10-18 | Discharge: 2022-10-18 | Disposition: A | Discharge status: Home / Self Care | Source: Ambulatory Visit | Attending: Radiation Oncology

## 2022-10-18 ENCOUNTER — Other Ambulatory Visit: Payer: Self-pay

## 2022-10-18 DIAGNOSIS — Z510 Encounter for antineoplastic radiation therapy: Secondary | ICD-10-CM | POA: Diagnosis not present

## 2022-10-18 LAB — RAD ONC ARIA SESSION SUMMARY
Course Elapsed Days: 23
Plan Fractions Treated to Date: 1
Plan Prescribed Dose Per Fraction: 2 Gy
Plan Total Fractions Prescribed: 4
Plan Total Prescribed Dose: 8 Gy
Reference Point Dosage Given to Date: 2 Gy
Reference Point Session Dosage Given: 2 Gy
Session Number: 17

## 2022-10-21 ENCOUNTER — Ambulatory Visit
Admission: RE | Admit: 2022-10-21 | Discharge: 2022-10-21 | Disposition: A | Discharge status: Home / Self Care | Source: Ambulatory Visit | Attending: Radiation Oncology

## 2022-10-21 ENCOUNTER — Other Ambulatory Visit: Payer: Self-pay

## 2022-10-21 DIAGNOSIS — Z510 Encounter for antineoplastic radiation therapy: Secondary | ICD-10-CM | POA: Diagnosis not present

## 2022-10-21 LAB — RAD ONC ARIA SESSION SUMMARY
Course Elapsed Days: 26
Plan Fractions Treated to Date: 2
Plan Prescribed Dose Per Fraction: 2 Gy
Plan Total Fractions Prescribed: 4
Plan Total Prescribed Dose: 8 Gy
Reference Point Dosage Given to Date: 4 Gy
Reference Point Session Dosage Given: 2 Gy
Session Number: 18

## 2022-10-22 ENCOUNTER — Other Ambulatory Visit: Payer: Self-pay

## 2022-10-22 ENCOUNTER — Ambulatory Visit
Admission: RE | Admit: 2022-10-22 | Discharge: 2022-10-22 | Disposition: A | Discharge status: Home / Self Care | Source: Ambulatory Visit | Attending: Radiation Oncology | Admitting: Radiation Oncology

## 2022-10-22 ENCOUNTER — Other Ambulatory Visit: Payer: Self-pay | Admitting: *Deleted

## 2022-10-22 DIAGNOSIS — Z510 Encounter for antineoplastic radiation therapy: Secondary | ICD-10-CM | POA: Diagnosis not present

## 2022-10-22 DIAGNOSIS — Z170 Estrogen receptor positive status [ER+]: Secondary | ICD-10-CM

## 2022-10-22 LAB — RAD ONC ARIA SESSION SUMMARY
Course Elapsed Days: 27
Plan Fractions Treated to Date: 3
Plan Prescribed Dose Per Fraction: 2 Gy
Plan Total Fractions Prescribed: 4
Plan Total Prescribed Dose: 8 Gy
Reference Point Dosage Given to Date: 6 Gy
Reference Point Session Dosage Given: 2 Gy
Session Number: 19

## 2022-10-23 ENCOUNTER — Ambulatory Visit
Admission: RE | Admit: 2022-10-23 | Discharge: 2022-10-23 | Disposition: A | Discharge status: Home / Self Care | Source: Ambulatory Visit | Attending: Radiation Oncology | Admitting: Radiation Oncology

## 2022-10-23 ENCOUNTER — Other Ambulatory Visit: Payer: Self-pay

## 2022-10-23 DIAGNOSIS — Z510 Encounter for antineoplastic radiation therapy: Secondary | ICD-10-CM | POA: Diagnosis not present

## 2022-10-23 LAB — RAD ONC ARIA SESSION SUMMARY
Course Elapsed Days: 28
Plan Fractions Treated to Date: 4
Plan Prescribed Dose Per Fraction: 2 Gy
Plan Total Fractions Prescribed: 4
Plan Total Prescribed Dose: 8 Gy
Reference Point Dosage Given to Date: 8 Gy
Reference Point Session Dosage Given: 2 Gy
Session Number: 20

## 2022-10-24 ENCOUNTER — Inpatient Hospital Stay: Attending: Hematology and Oncology

## 2022-10-24 ENCOUNTER — Inpatient Hospital Stay: Admitting: Hematology and Oncology

## 2022-10-24 VITALS — BP 135/73 | HR 98 | Temp 98.6°F | Resp 17 | Ht 66.0 in | Wt 205.5 lb

## 2022-10-24 DIAGNOSIS — M7989 Other specified soft tissue disorders: Secondary | ICD-10-CM | POA: Insufficient documentation

## 2022-10-24 DIAGNOSIS — C50212 Malignant neoplasm of upper-inner quadrant of left female breast: Secondary | ICD-10-CM | POA: Insufficient documentation

## 2022-10-24 DIAGNOSIS — Z79811 Long term (current) use of aromatase inhibitors: Secondary | ICD-10-CM | POA: Diagnosis not present

## 2022-10-24 DIAGNOSIS — K219 Gastro-esophageal reflux disease without esophagitis: Secondary | ICD-10-CM | POA: Insufficient documentation

## 2022-10-24 DIAGNOSIS — Z803 Family history of malignant neoplasm of breast: Secondary | ICD-10-CM | POA: Diagnosis not present

## 2022-10-24 DIAGNOSIS — Z923 Personal history of irradiation: Secondary | ICD-10-CM | POA: Insufficient documentation

## 2022-10-24 DIAGNOSIS — Z170 Estrogen receptor positive status [ER+]: Secondary | ICD-10-CM | POA: Insufficient documentation

## 2022-10-24 DIAGNOSIS — Z9221 Personal history of antineoplastic chemotherapy: Secondary | ICD-10-CM | POA: Diagnosis not present

## 2022-10-24 DIAGNOSIS — Z8042 Family history of malignant neoplasm of prostate: Secondary | ICD-10-CM | POA: Diagnosis not present

## 2022-10-24 LAB — CBC WITH DIFFERENTIAL (CANCER CENTER ONLY)
Abs Immature Granulocytes: 0.01 10*3/uL (ref 0.00–0.07)
Basophils Absolute: 0 10*3/uL (ref 0.0–0.1)
Basophils Relative: 1 %
Eosinophils Absolute: 0.1 10*3/uL (ref 0.0–0.5)
Eosinophils Relative: 3 %
HCT: 38.9 % (ref 36.0–46.0)
Hemoglobin: 12.7 g/dL (ref 12.0–15.0)
Immature Granulocytes: 0 %
Lymphocytes Relative: 19 %
Lymphs Abs: 0.7 10*3/uL (ref 0.7–4.0)
MCH: 28 pg (ref 26.0–34.0)
MCHC: 32.6 g/dL (ref 30.0–36.0)
MCV: 85.9 fL (ref 80.0–100.0)
Monocytes Absolute: 0.5 10*3/uL (ref 0.1–1.0)
Monocytes Relative: 13 %
Neutro Abs: 2.4 10*3/uL (ref 1.7–7.7)
Neutrophils Relative %: 64 %
Platelet Count: 244 10*3/uL (ref 150–400)
RBC: 4.53 MIL/uL (ref 3.87–5.11)
RDW: 14.3 % (ref 11.5–15.5)
WBC Count: 3.7 10*3/uL — ABNORMAL LOW (ref 4.0–10.5)
nRBC: 0 % (ref 0.0–0.2)

## 2022-10-24 LAB — CMP (CANCER CENTER ONLY)
ALT: 17 U/L (ref 0–44)
AST: 19 U/L (ref 15–41)
Albumin: 4.3 g/dL (ref 3.5–5.0)
Alkaline Phosphatase: 63 U/L (ref 38–126)
Anion gap: 6 (ref 5–15)
BUN: 14 mg/dL (ref 6–20)
CO2: 27 mmol/L (ref 22–32)
Calcium: 9.7 mg/dL (ref 8.9–10.3)
Chloride: 105 mmol/L (ref 98–111)
Creatinine: 0.8 mg/dL (ref 0.44–1.00)
GFR, Estimated: >60 mL/min (ref >60)
Glucose, Bld: 117 mg/dL — ABNORMAL HIGH (ref 70–99)
Potassium: 4.2 mmol/L (ref 3.5–5.1)
Sodium: 138 mmol/L (ref 135–145)
Total Bilirubin: 0.4 mg/dL (ref 0.3–1.2)
Total Protein: 7.4 g/dL (ref 6.5–8.1)

## 2022-10-24 MED ORDER — ANASTROZOLE 1 MG PO TABS
1.0000 mg | ORAL_TABLET | Freq: Every day | ORAL | 3 refills | Status: DC
Start: 2022-10-24 — End: 2023-10-23

## 2022-10-24 NOTE — Assessment & Plan Note (Addendum)
Isabel [name] is a 62 year old woman with stage IA ER+ breast cancer s/p lumpectomy and 4 cycles of Taxotere/Cytoxan.    Assessment and Plan    Breast Cancer Post-Treatment Completed chemotherapy and radiation therapy. No current neuropathy or other significant side effects reported. Mild leukopenia likely secondary to recent treatment -Continue monitoring and healing.  Hormone Therapy Initiation Discussed starting anastrozole to reduce estrogen production and decrease risk of breast cancer recurrence. Potential side effects discussed including hot flashes, vaginal dryness, aches, and potential bone density loss. -Start anastrozole 1mg  daily from November 05, 2022. -Order baseline bone density scan and repeat every two years. -Encourage weight-bearing exercises and intake of calcium and vitamin D for bone health.  Hair Loss Post-Chemotherapy Patient concerned about hair regrowth post-chemotherapy. -Continue current regimen of biotin and coconut oil. -Consider Nutrafol for hair growth if patient desires.  Follow-up Next appointment scheduled with Dr. Mardella Layman in December. Plan for biannual check-ups post-December appointment. - Consider Check blood counts at next visit to monitor leukopenia. -Check-in 3-4 months post-initiation of anastrozole to monitor for side effects and efficacy.

## 2022-10-24 NOTE — Progress Notes (Signed)
[facility] Cancer Follow up:    Isabel Broker, MD [address] [place] [address]   DIAGNOSIS:  Cancer Staging  Malignant neoplasm of upper-inner quadrant of left breast in female, estrogen receptor positive (HCC) Staging form: Breast, AJCC 8th Edition - Pathologic stage from 05/28/2022: Stage IA (pT1b, pN0, cM0, G2, ER+, PR-, HER2-) - Signed by Loa Socks, NP on 06/27/2022 Histologic grading system: 3 grade system   SUMMARY OF ONCOLOGIC HISTORY: Oncology History  Malignant neoplasm of upper-inner quadrant of left breast in female, estrogen receptor positive (HCC)  04/02/2022 Mammogram   In the left breast a possible asymmetry warrants further evaluation.  In the right breast, no findings suspicious for malignancy.  Further evaluation is suggested for possible asymmetry in the left breast.  Ultrasound breast showed indeterminate left breast focal asymmetry with possible distortion.    Pathology Results   Left breast needle core biopsy showed invasive ductal carcinoma overall grade 2, DCIS intermediate to high-grade, prognostic showed ER 100% positive strong staining PR 0% negative, Ki-67 of 10% and group 5 HER2 negative   05/28/2022 Definitive Surgery   Left lumpectomy showed no evidence of residual malignancy, 2 axillary lymph nodes negative for carcinoma.  Original biopsy showed 7 mm invasive ductal carcinoma.   05/28/2022 Cancer Staging   Staging form: Breast, AJCC 8th Edition - Pathologic stage from 05/28/2022: Stage IA (pT1b, pN0, cM0, G2, ER+, PR-, HER2-) - Signed by Loa Socks, NP on 06/27/2022 Histologic grading system: 3 grade system   06/06/2022 Oncotype testing   36/24%, >15% absolute chemotherapy benefit   06/27/2022 -  Chemotherapy   Patient is on Treatment Plan : BREAST TC q21d       CURRENT THERAPY: Taxotere/Cytoxan  INTERVAL HISTORY: Isabel [name] 62 y.o. female returns for follow-up and evaluation after 4  cycles of TC.  The patient, with a history of breast cancer, has recently completed chemotherapy and radiation treatment. She reports that the side effects from the chemotherapy have mostly resolved, with the exception of some residual swelling and scar tissue. She denies experiencing any neuropathy or tingling in her fingers or toes. Her energy levels are generally good, although she acknowledges having "her days." She has been exercising and trying to eat right to maintain her health.  In addition to her cancer treatment, the patient also reports having broken four toes in a recent fall. She has been managing the pain and swelling from this injury, which has been improving. She also reports some swelling in her legs, which the doctor attributes to the recent completion of radiation treatment.  She expresses some anxiety about potential side effects, including hot flashes, vaginal dryness, aches and pains, mood swings, difficulty sleeping, and potential bone density loss. Rest of the pertinent 10 point ROS reviewed and neg.   Patient Active Problem List   Diagnosis Date Noted   Malignant neoplasm of upper-inner quadrant of left breast in female, estrogen receptor positive (HCC) 05/13/2022   Pre-diabetes 04/01/2022   Routine general medical examination at a health care facility 09/29/2014   Obesity (BMI 35.0-39.9 without comorbidity) 04/18/2009    has No Known Allergies.  MEDICAL HISTORY: Past Medical History:  Diagnosis Date   Breast cancer (HCC) 05/01/2022   GERD (gastroesophageal reflux disease)    HYPERGLYCEMIA    OBESITY    Pre-diabetes     SURGICAL HISTORY: Past Surgical History:  Procedure Laterality Date   AXILLARY SENTINEL NODE BIOPSY Left 05/28/2022  Procedure: LEFT AXILLARY SENTINEL NODE BIOPSY;  Surgeon: Manus Rudd, MD;  Location: [facility] OR;  Service: General;  Laterality: Left;   BREAST BIOPSY Left 04/30/2022   MM LT BREAST BX W LOC DEV 1ST LESION IMAGE BX SPEC STEREO  GUIDE 04/30/2022 [facility]   BREAST BIOPSY  05/27/2022   MM LT RADIOACTIVE SEED LOC MAMMO GUIDE 05/27/2022 [facility]   BREAST LUMPECTOMY WITH RADIOACTIVE SEED AND SENTINEL LYMPH NODE BIOPSY Left 05/28/2022   Procedure: LEFT BREAST LUMPECTOMY WITH RADIOACTIVE SEED;  Surgeon: Manus Rudd, MD;  Location: [facility] OR;  Service: General;  Laterality: Left;   CESAREAN SECTION  1992   LAPAROSCOPIC CHOLECYSTECTOMY  2005    SOCIAL HISTORY: Social History   Socioeconomic History   Marital status: Married    Spouse name: Not on file   Number of children: Not on file   Years of education: Not on file   Highest education level: Not on file  Occupational History   Not on file  Tobacco Use   Smoking status: Never   Smokeless tobacco: Never   Tobacco comments:    Married, 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  Vaping Use   Vaping status: Never Used  Substance and Sexual Activity   Alcohol use: Not Currently    Alcohol/week: 2.0 standard drinks of alcohol    Types: 2 Glasses of wine per week   Drug use: No   Sexual activity: Not on file  Other Topics Concern   Not on file  Social History Narrative   Not on file   Social Determinants of Health   Financial Resource Strain: Not on file  Food Insecurity: No Food Insecurity (09/12/2022)   Hunger Vital Sign    Worried About Running Out of Food in the Last Year: Never true    Ran Out of Food in the Last Year: Never true  Transportation Needs: No Transportation Needs (09/12/2022)   PRAPARE - Administrator, Civil Service (Medical): No    Lack of Transportation (Non-Medical): No  Physical Activity: Not on file  Stress: Not on file  Social Connections: Not on file  Intimate Partner Violence: Not At Risk (09/12/2022)   Humiliation, Afraid, Rape, and Kick questionnaire    Fear of Current or Ex-Partner: No    Emotionally Abused: No    Physically Abused: No    Sexually Abused: No    FAMILY  HISTORY: Family History  Problem Relation Age of Onset   Prostate cancer Father    Breast cancer Maternal Aunt 55   Stroke Maternal Grandmother    Hypertension Brother    Kidney disease Brother    Colon cancer Neg Hx    Stomach cancer Neg Hx     Review of Systems  Constitutional:  Positive for fatigue. Negative for appetite change, chills, fever and unexpected weight change.  HENT:   Negative for hearing loss, lump/mass and trouble swallowing.   Eyes:  Negative for eye problems and icterus.  Respiratory:  Negative for chest tightness, cough and shortness of breath.   Cardiovascular:  Negative for chest pain, leg swelling and palpitations.  Gastrointestinal:  Negative for abdominal distention, abdominal pain, constipation, diarrhea, nausea and vomiting.  Endocrine: Negative for hot flashes.  Genitourinary:  Negative for difficulty urinating.   Musculoskeletal:  Negative for arthralgias.  Skin:  Negative for itching and rash.  Neurological:  Negative for dizziness, extremity weakness, headaches and numbness.  Hematological:  Negative for adenopathy. Does not bruise/bleed  easily.  Psychiatric/Behavioral:  Negative for depression. The patient is not nervous/anxious.       PHYSICAL EXAMINATION     Vitals:   10/24/22 0811  BP: 135/73  Pulse: 98  Resp: 17  Temp: 98.6 F (37 C)  SpO2: 100%   General Appearance: Alert, oriented and in no acute distress Neck: No cervical lymphadenopathy Breast: Bilateral breast inspected and palpated, seroma noted in the left breast at the surgical site and postop/postradiation changes.  No skin desquamation.   LABORATORY DATA:  CBC    Component Value Date/Time   WBC 3.7 (L) 10/24/2022 0800   WBC 5.3 05/23/2022 0930   RBC 4.53 10/24/2022 0800   HGB 12.7 10/24/2022 0800   HCT 38.9 10/24/2022 0800   PLT 244 10/24/2022 0800   MCV 85.9 10/24/2022 0800   MCH 28.0 10/24/2022 0800   MCHC 32.6 10/24/2022 0800   RDW 14.3 10/24/2022 0800    LYMPHSABS 0.7 10/24/2022 0800   MONOABS 0.5 10/24/2022 0800   EOSABS 0.1 10/24/2022 0800   BASOSABS 0.0 10/24/2022 0800    CMP     Component Value Date/Time   NA 138 10/24/2022 0800   K 4.2 10/24/2022 0800   CL 105 10/24/2022 0800   CO2 27 10/24/2022 0800   GLUCOSE 117 (H) 10/24/2022 0800   BUN 14 10/24/2022 0800   CREATININE 0.80 10/24/2022 0800   CALCIUM 9.7 10/24/2022 0800   PROT 7.4 10/24/2022 0800   ALBUMIN 4.3 10/24/2022 0800   AST 19 10/24/2022 0800   ALT 17 10/24/2022 0800   ALKPHOS 63 10/24/2022 0800   BILITOT 0.4 10/24/2022 0800   GFRNONAA >60 10/24/2022 0800     ASSESSMENT and THERAPY PLAN:   Malignant neoplasm of upper-inner quadrant of left breast in female, estrogen receptor positive (HCC) Isabel [name] is a 62 year old woman with stage IA ER+ breast cancer s/p lumpectomy and 4 cycles of Taxotere/Cytoxan.    Assessment and Plan    Breast Cancer Post-Treatment Completed chemotherapy and radiation therapy. No current neuropathy or other significant side effects reported. Mild leukopenia likely secondary to recent treatment -Continue monitoring and healing.  Hormone Therapy Initiation Discussed starting anastrozole to reduce estrogen production and decrease risk of breast cancer recurrence. Potential side effects discussed including hot flashes, vaginal dryness, aches, and potential bone density loss. -Start anastrozole 1mg  daily from November 05, 2022. -Order baseline bone density scan and repeat every two years. -Encourage weight-bearing exercises and intake of calcium and vitamin D for bone health.  Hair Loss Post-Chemotherapy Patient concerned about hair regrowth post-chemotherapy. -Continue current regimen of biotin and coconut oil. -Consider Nutrafol for hair growth if patient desires.  Follow-up Next appointment scheduled with Dr. Mardella Layman in December. Plan for biannual check-ups post-December appointment. - Consider Check blood counts at next visit to  monitor leukopenia. -Check-in 3-4 months post-initiation of anastrozole to monitor for side effects and efficacy.         All questions were answered. The patient knows to call the clinic with any problems, questions or concerns. We can certainly see the patient much sooner if necessary.  Total encounter time:20 minutes*in face-to-face visit time, chart review, lab review, care coordination, order entry, and documentation of the encounter time.   *Total Encounter Time as defined by the Centers for Medicare and Medicaid Services includes, in addition to the face-to-face time of a patient visit (documented in the note above) non-face-to-face time: obtaining and reviewing outside history, ordering and reviewing medications, tests or procedures, care  coordination (communications with other health care professionals or caregivers) and documentation in the medical record.

## 2022-10-25 NOTE — Radiation Completion Notes (Signed)
Radiation Oncology         [phone] ________________________________  Name: Isabel [name] Frieden MRN: [id number]  Date of Service: 10/23/2022  DOB: [birth year]  End of Treatment Note    Diagnosis: Stage IA, cT1bN0M0, grade 2, ER positive, invasive ductal carcinoma of the left breast with no residual disease at the time of lumpectomy   Intent: Curative     ==========DELIVERED PLANS==========  First Treatment Date: 2022-09-25 - Last Treatment Date: 2022-10-23   Plan Name: Breast_L_BH Site: Breast, Left Technique: 3D Mode: Photon Dose Per Fraction: 2.66 Gy Prescribed Dose (Delivered / Prescribed): 42.56 Gy / 42.56 Gy Prescribed Fxs (Delivered / Prescribed): 16 / 16   Plan Name: Brst_L_Bst_BH Site: Breast, Left Technique: 3D Mode: Photon Dose Per Fraction: 2 Gy Prescribed Dose (Delivered / Prescribed): 8 Gy / 8 Gy Prescribed Fxs (Delivered / Prescribed): 4 / 4     ==========ON TREATMENT VISIT DATES========== 2022-09-27, 2022-10-04, 2022-10-11, 2022-10-18   See weekly On Treatment Notes in Epic for details. The patient tolerated radiation. She developed  anticipated skin changes in the treatment field.   The patient will receive a call in about one month from the radiation oncology department. She will continue follow up with Dr. Al Pimple as well.      Osker Mason, PAC

## 2022-11-09 ENCOUNTER — Other Ambulatory Visit: Payer: Self-pay

## 2022-11-18 ENCOUNTER — Other Ambulatory Visit: Payer: Self-pay | Admitting: Radiation Oncology

## 2022-11-18 DIAGNOSIS — Z170 Estrogen receptor positive status [ER+]: Secondary | ICD-10-CM

## 2022-11-19 ENCOUNTER — Ambulatory Visit: Attending: Surgery | Admitting: Physical Therapy

## 2022-11-19 ENCOUNTER — Other Ambulatory Visit: Payer: Self-pay

## 2022-11-19 ENCOUNTER — Encounter: Payer: Self-pay | Admitting: Physical Therapy

## 2022-11-19 DIAGNOSIS — C50212 Malignant neoplasm of upper-inner quadrant of left female breast: Secondary | ICD-10-CM | POA: Diagnosis present

## 2022-11-19 DIAGNOSIS — L599 Disorder of the skin and subcutaneous tissue related to radiation, unspecified: Secondary | ICD-10-CM | POA: Diagnosis present

## 2022-11-19 DIAGNOSIS — Z170 Estrogen receptor positive status [ER+]: Secondary | ICD-10-CM | POA: Insufficient documentation

## 2022-11-19 DIAGNOSIS — I890 Lymphedema, not elsewhere classified: Secondary | ICD-10-CM | POA: Insufficient documentation

## 2022-11-19 NOTE — Therapy (Addendum)
OUTPATIENT PHYSICAL THERAPY BREAST CANCER  EVALUATION   Patient Name: Isabel [name] MRN: [id number] DOB:[birth year], 62 y.o., female Today's Date: 11/19/2022  END OF SESSION:  PT End of Session - 11/19/22 1046     Visit Number 1    Number of Visits 9    Date for PT Re-Evaluation 12/17/22    PT Start Time 1003    PT Stop Time 1045    PT Time Calculation (min) 42 min    Activity Tolerance Patient tolerated treatment well    Behavior During Therapy [facility] for tasks assessed/performed              Past Medical History:  Diagnosis Date   Breast cancer (HCC) 05/01/2022   GERD (gastroesophageal reflux disease)    HYPERGLYCEMIA    OBESITY    Pre-diabetes    Past Surgical History:  Procedure Laterality Date   AXILLARY SENTINEL NODE BIOPSY Left 05/28/2022   Procedure: LEFT AXILLARY SENTINEL NODE BIOPSY;  Surgeon: Manus Rudd, MD;  Location: [facility] OR;  Service: General;  Laterality: Left;   BREAST BIOPSY Left 04/30/2022   MM LT BREAST BX W LOC DEV 1ST LESION IMAGE BX SPEC STEREO GUIDE 04/30/2022 [facility]   BREAST BIOPSY  05/27/2022   MM LT RADIOACTIVE SEED LOC MAMMO GUIDE 05/27/2022 [facility]   BREAST LUMPECTOMY WITH RADIOACTIVE SEED AND SENTINEL LYMPH NODE BIOPSY Left 05/28/2022   Procedure: LEFT BREAST LUMPECTOMY WITH RADIOACTIVE SEED;  Surgeon: Manus Rudd, MD;  Location: [facility] OR;  Service: General;  Laterality: Left;   CESAREAN SECTION  1992   LAPAROSCOPIC CHOLECYSTECTOMY  2005   Patient Active Problem List   Diagnosis Date Noted   Malignant neoplasm of upper-inner quadrant of left breast in female, estrogen receptor positive (HCC) 05/13/2022   Pre-diabetes 04/01/2022   Routine general medical examination at a health care facility 09/29/2014   Obesity (BMI 35.0-39.9 without comorbidity) 04/18/2009    PCP: Dr. Hillard Danker  REFERRING PROVIDER: Ronny Bacon, PA-C  REFERRING DIAG: [phone] (ICD-10-CM) - Malignant neoplasm of  upper-inner quadrant of left breast in female, estrogen receptor positive (HCC)    THERAPY DIAG:  Lymphedema, not elsewhere classified  Disorder of the skin and subcutaneous tissue related to radiation, unspecified  Malignant neoplasm of upper-inner quadrant of left breast in female, estrogen receptor positive (HCC)  Rationale for Evaluation and Treatment: Rehabilitation  ONSET DATE: 05/13/22  SUBJECTIVE:                                                                                                                                                                                           SUBJECTIVE  STATEMENT: My breast has been hard since radiation. I finished radiation 3 weeks ago. My skin feels a little tough.   PERTINENT HISTORY:  Patient was diagnosed with left grade 2 IDC. It is located in the upper inner quadrant. It is ER pos/PR neg and HER2 neg with a Ki67 of 10%. Underwent L lumpectomy and SLNB on 05/28/22 0/2 nodes  PATIENT GOALS:   reduce lymphedema risk and learn post op HEP.   PAIN:  Are you having pain? No  PRECAUTIONS: L UE/breast lymphedema risk  HAND DOMINANCE: right  WEIGHT BEARING RESTRICTIONS: No  FALLS:  Has patient fallen in last 6 months? Yes. Number of falls 1, fell in a hole/indent in the ground when going to the mailbox and broke 4 toes  LIVING ENVIRONMENT: Patient lives with: husband and niece, in a house  OCCUPATION: Retired   LEISURE: Elliptical 60 min per day  PRIOR LEVEL OF FUNCTION: Independent   OBJECTIVE:  COGNITION: Overall cognitive status: Within functional limits for tasks assessed    POSTURE:  Forward head and rounded shoulders posture  OBSERVATION/PALPATION:  L breast with peau d orange texture, increased fibrosis palpable in inferior and medial breast, increased scar tissue palpable at lumpectomy scar   UPPER EXTREMITY AROM/PROM: WFL  UPPER EXTREMITY STRENGTH: 5/5 strength   LYMPHEDEMA ASSESSMENTS:   LANDMARK RIGHT    eval  10 cm proximal to olecranon process 31.9  Olecranon process 28.3  10 cm proximal to ulnar styloid process 23  Just proximal to ulnar styloid process 18.8  Across hand at thumb web space 20.5  At base of 2nd digit 7  (Blank rows = not tested)  LANDMARK LEFT   eval  10 cm proximal to olecranon process 32.6  Olecranon process 30.5  10 cm proximal to ulnar styloid process 23.1  Just proximal to ulnar styloid process 18.5  Across hand at thumb web space 20  At base of 2nd digit 7.4  (Blank rows = not tested)  L-DEX LYMPHEDEMA SCREENING:   L-DEX FLOWSHEETS - 11/19/22 1000       L-DEX LYMPHEDEMA SCREENING   Measurement Type Unilateral    L-DEX MEASUREMENT EXTREMITY Upper Extremity    POSITION  Standing    DOMINANT SIDE Right    At Risk Side Left    BASELINE SCORE (UNILATERAL) 5    L-DEX SCORE (UNILATERAL) 5.7    VALUE CHANGE (UNILAT) 0.7             The patient was assessed using the L-Dex machine today to produce a lymphedema index baseline score. The patient will be reassessed on a regular basis (typically every 3 months) to obtain new L-Dex scores. If the score is > 6.5 points away from his/her baseline score indicating onset of subclinical lymphedema, it will be recommended to wear a compression garment for 4 weeks, 12 hours per day and then be reassessed. If the score continues to be > 6.5 points from baseline at reassessment, we will initiate lymphedema treatment. Assessing in this manner has a 95% rate of preventing clinically significant lymphedema.  QUICK DASH SURVEY: 0%  BREAST COMPLAINTS: 22/80  TREATMENT:  11/19/22: In supine: Short neck, 5 diaphragmatic breaths, R axillary nodes and establishment of interaxillary pathway, L inguinal nodes and establishment of axilloinguinal pathway, then L breast moving fluid towards pathways spending extra time in any areas of fibrosis then retracing all steps. Educated pt throughout in anatomy and physiology of the  lymphatic system and basics of self MLD  PATIENT EDUCATION:  Education details:anatomy and physiology of lymphatic system and basic principles of self MLD, need for compression Person educated: Patient Education method: Explanation, Demonstration Education comprehension: Patient verbalized understanding   HOME EXERCISE PROGRAM: Try to wear compression bra 24/7, wear compression when exercising, use chip pack in bra as tolerable   ASSESSMENT:  CLINICAL IMPRESSION: Pt reports to PT with L breast lymphedema that began after completing radiation for treatment of L breast cancer. She underwent a L lumpectomy and SLNB (0/2) on 05/28/22. She has developed lymphedema and fibrosis in inferior and medial L breast with peau d orange texture. She reports she has been exercising without no bra on. Educated pt on importance of compression for management of lymphedema. Began instructing pt in basic principles of self MLD. She would benefit from skilled PT services to decrease L breast lymphedema.   Pt will benefit from skilled therapeutic intervention to improve on the following deficits: Decreased knowledge of precautions, decreased knowledge of condition, increased edema, pain, postural dysfunction.   PT treatment/interventions: ADL/self-care home management, pt/family education, manual lymphatic drainage, compression bandaging, taping, vasopneumatic device, orthotic fit, therapeutic exercise  REHAB POTENTIAL: Excellent  CLINICAL DECISION MAKING: Stable/uncomplicated  EVALUATION COMPLEXITY: Low   GOALS: Goals reviewed with patient? YES  LONG TERM GOALS: (STG=LTG)    Name Target Date Goal status  1 Pt will be independent in self MLD for long term management of lymphedema. Baseline:  No knowledge 12/17/22 NEW  2 Pt will report a 50% improvement in L breast fibrosis to decrease risk of infection. Baseline:  No knowledge 12/17/22 NEW  3 Pt will be able to verbalize lymphedema risk reduction  practices to decrease risk of worsening lymphedema. Baseline:  No knowledge 12/17/22 NEW    PLAN:  PT FREQUENCY/DURATION: 2x/wk for 4 wks  PLAN FOR NEXT SESSION: instruct in self MLD technique for L breast, how was chip pack?     [employer], PT 11/19/2022, 11:01 AM

## 2022-11-26 ENCOUNTER — Ambulatory Visit: Admitting: Physical Therapy

## 2022-11-26 ENCOUNTER — Encounter: Payer: Self-pay | Admitting: Physical Therapy

## 2022-11-26 DIAGNOSIS — I890 Lymphedema, not elsewhere classified: Secondary | ICD-10-CM | POA: Diagnosis not present

## 2022-11-26 DIAGNOSIS — Z170 Estrogen receptor positive status [ER+]: Secondary | ICD-10-CM

## 2022-11-26 DIAGNOSIS — L599 Disorder of the skin and subcutaneous tissue related to radiation, unspecified: Secondary | ICD-10-CM

## 2022-11-26 NOTE — Therapy (Signed)
OUTPATIENT PHYSICAL THERAPY BREAST CANCER  TREATMENT   Patient Name: Isabel [name] Masoud MRN: [id number] DOB:[birth year], 61 y.o., female Today's Date: 11/26/2022  END OF SESSION:  PT End of Session - 11/26/22 1005     Visit Number 2    Number of Visits 9    Date for PT Re-Evaluation 12/17/22    PT Start Time 0909    PT Stop Time 0959    PT Time Calculation (min) 50 min    Activity Tolerance Patient tolerated treatment well    Behavior During Therapy [facility] for tasks assessed/performed               Past Medical History:  Diagnosis Date   Breast cancer (HCC) 05/01/2022   GERD (gastroesophageal reflux disease)    HYPERGLYCEMIA    OBESITY    Pre-diabetes    Past Surgical History:  Procedure Laterality Date   AXILLARY SENTINEL NODE BIOPSY Left 05/28/2022   Procedure: LEFT AXILLARY SENTINEL NODE BIOPSY;  Surgeon: Manus Rudd, MD;  Location: [facility] OR;  Service: General;  Laterality: Left;   BREAST BIOPSY Left 04/30/2022   MM LT BREAST BX W LOC DEV 1ST LESION IMAGE BX SPEC STEREO GUIDE 04/30/2022 [facility]   BREAST BIOPSY  05/27/2022   MM LT RADIOACTIVE SEED LOC MAMMO GUIDE 05/27/2022 [facility]   BREAST LUMPECTOMY WITH RADIOACTIVE SEED AND SENTINEL LYMPH NODE BIOPSY Left 05/28/2022   Procedure: LEFT BREAST LUMPECTOMY WITH RADIOACTIVE SEED;  Surgeon: Manus Rudd, MD;  Location: [facility] OR;  Service: General;  Laterality: Left;   CESAREAN SECTION  1992   LAPAROSCOPIC CHOLECYSTECTOMY  2005   Patient Active Problem List   Diagnosis Date Noted   Malignant neoplasm of upper-inner quadrant of left breast in female, estrogen receptor positive (HCC) 05/13/2022   Pre-diabetes 04/01/2022   Routine general medical examination at a health care facility 09/29/2014   Obesity (BMI 35.0-39.9 without comorbidity) 04/18/2009    PCP: Dr. Hillard Danker  REFERRING PROVIDER: Ronny Bacon, PA-C  REFERRING DIAG: [phone] (ICD-10-CM) - Malignant neoplasm of  upper-inner quadrant of left breast in female, estrogen receptor positive (HCC)    THERAPY DIAG:  Lymphedema, not elsewhere classified  Disorder of the skin and subcutaneous tissue related to radiation, unspecified  Malignant neoplasm of upper-inner quadrant of left breast in female, estrogen receptor positive (HCC)  Rationale for Evaluation and Treatment: Rehabilitation  ONSET DATE: 05/13/22  SUBJECTIVE:  SUBJECTIVE STATEMENT: I wear the chip pack everyday. I do not wear it when I exercise.   PERTINENT HISTORY:  Patient was diagnosed with left grade 2 IDC. It is located in the upper inner quadrant. It is ER pos/PR neg and HER2 neg with a Ki67 of 10%. Underwent L lumpectomy and SLNB on 05/28/22 0/2 nodes  PATIENT GOALS:   reduce lymphedema risk and learn post op HEP.   PAIN:  Are you having pain? No  PRECAUTIONS: L UE/breast lymphedema risk  HAND DOMINANCE: right  WEIGHT BEARING RESTRICTIONS: No  FALLS:  Has patient fallen in last 6 months? Yes. Number of falls 1, fell in a hole/indent in the ground when going to the mailbox and broke 4 toes  LIVING ENVIRONMENT: Patient lives with: husband and niece, in a house  OCCUPATION: Retired   LEISURE: Elliptical 60 min per day  PRIOR LEVEL OF FUNCTION: Independent   OBJECTIVE:  COGNITION: Overall cognitive status: Within functional limits for tasks assessed    POSTURE:  Forward head and rounded shoulders posture  OBSERVATION/PALPATION:  L breast with peau d orange texture, increased fibrosis palpable in inferior and medial breast, increased scar tissue palpable at lumpectomy scar   UPPER EXTREMITY AROM/PROM: WFL  UPPER EXTREMITY STRENGTH: 5/5 strength   LYMPHEDEMA ASSESSMENTS:   LANDMARK RIGHT   eval  10 cm proximal to olecranon  process 31.9  Olecranon process 28.3  10 cm proximal to ulnar styloid process 23  Just proximal to ulnar styloid process 18.8  Across hand at thumb web space 20.5  At base of 2nd digit 7  (Blank rows = not tested)  LANDMARK LEFT   eval  10 cm proximal to olecranon process 32.6  Olecranon process 30.5  10 cm proximal to ulnar styloid process 23.1  Just proximal to ulnar styloid process 18.5  Across hand at thumb web space 20  At base of 2nd digit 7.4  (Blank rows = not tested)  L-DEX LYMPHEDEMA SCREENING:     The patient was assessed using the L-Dex machine today to produce a lymphedema index baseline score. The patient will be reassessed on a regular basis (typically every 3 months) to obtain new L-Dex scores. If the score is > 6.5 points away from his/her baseline score indicating onset of subclinical lymphedema, it will be recommended to wear a compression garment for 4 weeks, 12 hours per day and then be reassessed. If the score continues to be > 6.5 points from baseline at reassessment, we will initiate lymphedema treatment. Assessing in this manner has a 95% rate of preventing clinically significant lymphedema.  QUICK DASH SURVEY: 0%  BREAST COMPLAINTS: 22/80  TREATMENT: 11/26/22: In supine: Short neck, 5 diaphragmatic breaths, R axillary nodes and establishment of interaxillary pathway, L inguinal nodes and establishment of axilloinguinal pathway, then L breast moving fluid towards pathways spending extra time in any areas of fibrosis then retracing all steps. Educated pt throughout in anatomy and physiology of the lymphatic system and basics of self MLD. Had pt return demonstrate each step of sequence and provided v/c and t/c for correct skin stretch, pressure and sequence. Issued handout for pt to practice at home.   11/19/22: In supine: Short neck, 5 diaphragmatic breaths, R axillary nodes and establishment of interaxillary pathway, L inguinal nodes and establishment of  axilloinguinal pathway, then L breast moving fluid towards pathways spending extra time in any areas of fibrosis then retracing all steps. Educated pt throughout in anatomy and physiology of the lymphatic  system and basics of self MLD   PATIENT EDUCATION:  Education details:anatomy and physiology of lymphatic system and basic principles of self MLD, need for compression Person educated: Patient Education method: Explanation, Demonstration Education comprehension: Patient verbalized understanding   HOME EXERCISE PROGRAM: Try to wear compression bra 24/7, wear compression when exercising, use chip pack in bra as tolerable   ASSESSMENT:  CLINICAL IMPRESSION: Continued to instruct pt in self MLD technique and had her return demonstrate each step and provided appropriate v/c and t/c. Issued handout for pt to begin practicing at home. She demonstrated a good skin stretch by end of session. Her breast was notably softer in area of fibrosis at medial breat.   Pt will benefit from skilled therapeutic intervention to improve on the following deficits: Decreased knowledge of precautions, decreased knowledge of condition, increased edema, pain, postural dysfunction.   PT treatment/interventions: ADL/self-care home management, pt/family education, manual lymphatic drainage, compression bandaging, taping, vasopneumatic device, orthotic fit, therapeutic exercise  REHAB POTENTIAL: Excellent  CLINICAL DECISION MAKING: Stable/uncomplicated  EVALUATION COMPLEXITY: Low   GOALS: Goals reviewed with patient? YES  LONG TERM GOALS: (STG=LTG)    Name Target Date Goal status  1 Pt will be independent in self MLD for long term management of lymphedema. Baseline:  No knowledge 12/17/22 NEW  2 Pt will report a 50% improvement in L breast fibrosis to decrease risk of infection. Baseline:  No knowledge 12/17/22 NEW  3 Pt will be able to verbalize lymphedema risk reduction practices to decrease risk of  worsening lymphedema. Baseline:  No knowledge 12/17/22 NEW    PLAN:  PT FREQUENCY/DURATION: 2x/wk for 4 wks  PLAN FOR NEXT SESSION: instruct in self MLD technique for L breast     Leonette Most, PT 11/26/2022, 10:22 AM

## 2022-11-26 NOTE — Patient Instructions (Signed)
Self manual lymph drainage: Perform this sequence once a day.  Only give enough pressure no your skin to make the skin move.  Diaphragmatic - Supine   Inhale through nose making navel move out toward hands. Exhale through puckered lips, hands follow navel in. Repeat _5__ times. Rest _10__ seconds between repeats.   Copyright  VHI. All rights reserved.  Hug yourself.  Do circles at your neck just above your collarbones.  Repeat this 10 times.  Axilla - One at a Time   Using full weight of flat hand and fingers at center of uninvolved armpit, make _10__ in-place circles.   Copyright  VHI. All rights reserved.  LEG: Inguinal Nodes Stimulation   With small finger side of hand against hip crease on involved side, gently perform circles at the crease. Repeat __10_ times.   Copyright  VHI. All rights reserved.  Axilla to Inguinal Nodes - Sweep   On involved side, sweep (stretch skin) _4__ times from armpit along side of trunk to hip crease.  Now gently stretch skin from the involved side to the uninvolved side across the chest at the shoulder line.  Repeat that 4 times.  Draw an imaginary diagonal line from upper outer breast through the nipple area toward lower inner breast.  Direct fluid upward and inward from this line toward the pathway across your upper chest .  Do this in three rows to treat all of the upper inner breast tissue, and do each row 3-4x.      Direct fluid to treat all of lower outer breast tissue downward and outward toward pathway that is aimed at the left groin.  Finish by doing the pathways as described above going from your involved armpit to the same side groin and going across your upper chest from the involved shoulder to the uninvolved shoulder.  Repeat the steps above where you do circles in your left groin and right armpit. Copyright  VHI. All rights reserved.

## 2022-12-03 ENCOUNTER — Ambulatory Visit: Admitting: Physical Therapy

## 2022-12-03 ENCOUNTER — Encounter: Payer: Self-pay | Admitting: Physical Therapy

## 2022-12-03 DIAGNOSIS — I890 Lymphedema, not elsewhere classified: Secondary | ICD-10-CM

## 2022-12-03 DIAGNOSIS — L599 Disorder of the skin and subcutaneous tissue related to radiation, unspecified: Secondary | ICD-10-CM

## 2022-12-03 DIAGNOSIS — C50212 Malignant neoplasm of upper-inner quadrant of left female breast: Secondary | ICD-10-CM

## 2022-12-03 NOTE — Therapy (Signed)
OUTPATIENT PHYSICAL THERAPY BREAST CANCER  TREATMENT   Patient Name: Isabel [name] Cardarelli MRN: [id number] DOB:[birth year], 61 y.o., female Today's Date: 12/03/2022  END OF SESSION:  PT End of Session - 12/03/22 1107     Visit Number 3    Number of Visits 9    Date for PT Re-Evaluation 12/17/22    PT Start Time 1106    PT Stop Time 1155    PT Time Calculation (min) 49 min    Activity Tolerance Patient tolerated treatment well    Behavior During Therapy [facility] for tasks assessed/performed               Past Medical History:  Diagnosis Date   Breast cancer (HCC) 05/01/2022   GERD (gastroesophageal reflux disease)    HYPERGLYCEMIA    OBESITY    Pre-diabetes    Past Surgical History:  Procedure Laterality Date   AXILLARY SENTINEL NODE BIOPSY Left 05/28/2022   Procedure: LEFT AXILLARY SENTINEL NODE BIOPSY;  Surgeon: Manus Rudd, MD;  Location: [facility];  Service: General;  Laterality: Left;   BREAST BIOPSY Left 04/30/2022   MM LT BREAST BX W LOC DEV 1ST LESION IMAGE BX SPEC STEREO GUIDE 04/30/2022 [facility]   BREAST BIOPSY  05/27/2022   MM LT RADIOACTIVE SEED LOC MAMMO GUIDE 05/27/2022 [facility]   BREAST LUMPECTOMY WITH RADIOACTIVE SEED AND SENTINEL LYMPH NODE BIOPSY Left 05/28/2022   Procedure: LEFT BREAST LUMPECTOMY WITH RADIOACTIVE SEED;  Surgeon: Manus Rudd, MD;  Location: [facility];  Service: General;  Laterality: Left;   CESAREAN SECTION  1992   LAPAROSCOPIC CHOLECYSTECTOMY  2005   Patient Active Problem List   Diagnosis Date Noted   Malignant neoplasm of upper-inner quadrant of left breast in female, estrogen receptor positive (HCC) 05/13/2022   Pre-diabetes 04/01/2022   Routine general medical examination at a health care facility 09/29/2014   Obesity (BMI 35.0-39.9 without comorbidity) 04/18/2009    PCP: Dr. Hillard Danker  REFERRING PROVIDER: Ronny Bacon, PA-C  REFERRING DIAG: [phone] (ICD-10-CM) - Malignant neoplasm of  upper-inner quadrant of left breast in female, estrogen receptor positive (HCC)    THERAPY DIAG:  Lymphedema, not elsewhere classified  Disorder of the skin and subcutaneous tissue related to radiation, unspecified  Malignant neoplasm of upper-inner quadrant of left breast in female, estrogen receptor positive (HCC)  Rationale for Evaluation and Treatment: Rehabilitation  ONSET DATE: 05/13/22  SUBJECTIVE:  SUBJECTIVE STATEMENT: My breast feels good. I think I am doing pretty good with the massage. I keep the foam in my bra a lot.   PERTINENT HISTORY:  Patient was diagnosed with left grade 2 IDC. It is located in the upper inner quadrant. It is ER pos/PR neg and HER2 neg with a Ki67 of 10%. Underwent L lumpectomy and SLNB on 05/28/22 0/2 nodes  PATIENT GOALS:   reduce lymphedema risk and learn post op HEP.   PAIN:  Are you having pain? No  PRECAUTIONS: L UE/breast lymphedema risk  HAND DOMINANCE: right  WEIGHT BEARING RESTRICTIONS: No  FALLS:  Has patient fallen in last 6 months? Yes. Number of falls 1, fell in a hole/indent in the ground when going to the mailbox and broke 4 toes  LIVING ENVIRONMENT: Patient lives with: husband and niece, in a house  OCCUPATION: Retired   LEISURE: Elliptical 60 min per day  PRIOR LEVEL OF FUNCTION: Independent   OBJECTIVE:  COGNITION: Overall cognitive status: Within functional limits for tasks assessed    POSTURE:  Forward head and rounded shoulders posture  OBSERVATION/PALPATION:  L breast with peau d orange texture, increased fibrosis palpable in inferior and medial breast, increased scar tissue palpable at lumpectomy scar   UPPER EXTREMITY AROM/PROM: WFL  UPPER EXTREMITY STRENGTH: 5/5 strength   LYMPHEDEMA ASSESSMENTS:   LANDMARK RIGHT    eval  10 cm proximal to olecranon process 31.9  Olecranon process 28.3  10 cm proximal to ulnar styloid process 23  Just proximal to ulnar styloid process 18.8  Across hand at thumb web space 20.5  At base of 2nd digit 7  (Blank rows = not tested)  LANDMARK LEFT   eval  10 cm proximal to olecranon process 32.6  Olecranon process 30.5  10 cm proximal to ulnar styloid process 23.1  Just proximal to ulnar styloid process 18.5  Across hand at thumb web space 20  At base of 2nd digit 7.4  (Blank rows = not tested)  L-DEX LYMPHEDEMA SCREENING:     The patient was assessed using the L-Dex machine today to produce a lymphedema index baseline score. The patient will be reassessed on a regular basis (typically every 3 months) to obtain new L-Dex scores. If the score is > 6.5 points away from his/her baseline score indicating onset of subclinical lymphedema, it will be recommended to wear a compression garment for 4 weeks, 12 hours per day and then be reassessed. If the score continues to be > 6.5 points from baseline at reassessment, we will initiate lymphedema treatment. Assessing in this manner has a 95% rate of preventing clinically significant lymphedema.  QUICK DASH SURVEY: 0%  BREAST COMPLAINTS: 22/80  TREATMENT: 12/03/22: In supine: Short neck, 5 diaphragmatic breaths, R axillary nodes and establishment of interaxillary pathway, L inguinal nodes and establishment of axilloinguinal pathway, then L breast moving fluid towards pathways spending extra time in any areas of fibrosis then retracing all steps. Educated pt throughout in anatomy and physiology of the lymphatic system and basics of self MLD. Had pt return demonstrate each step of sequence and provided v/c and t/c for correct skin stretch, pressure and sequence and speed. Instructed pt in snow angel pec stretch x 5 reps, leaning in to flexion and abduction on wall for increased LUE stretch, and lower trunk rotation stretch with  LUE extending with pt feeling strong stretch with this  11/26/22: In supine: Short neck, 5 diaphragmatic breaths, R axillary nodes and establishment of  interaxillary pathway, L inguinal nodes and establishment of axilloinguinal pathway, then L breast moving fluid towards pathways spending extra time in any areas of fibrosis then retracing all steps. Educated pt throughout in anatomy and physiology of the lymphatic system and basics of self MLD. Had pt return demonstrate each step of sequence and provided v/c and t/c for correct skin stretch, pressure and sequence. Issued handout for pt to practice at home.   11/19/22: In supine: Short neck, 5 diaphragmatic breaths, R axillary nodes and establishment of interaxillary pathway, L inguinal nodes and establishment of axilloinguinal pathway, then L breast moving fluid towards pathways spending extra time in any areas of fibrosis then retracing all steps. Educated pt throughout in anatomy and physiology of the lymphatic system and basics of self MLD   PATIENT EDUCATION:  Education details:anatomy and physiology of lymphatic system and basic principles of self MLD, need for compression Person educated: Patient Education method: Programmer, multimedia, Demonstration Education comprehension: Patient verbalized understanding   HOME EXERCISE PROGRAM: Try to wear compression bra 24/7, wear compression when exercising, use chip pack in bra as tolerable  Lower trunk rotation stretch with LUE outstretched with 30-60 holds x 5  ASSESSMENT:  CLINICAL IMPRESSION: Continued to instruct pt in self MLD technique and had her return demonstrate each step and provided appropriate v/c and t/c for skin stretch and speed. Educated pt to try setting a metronome to keep her pace slower. Instructed pt in UE stretch to help decrease L axillary tightness.   Pt will benefit from skilled therapeutic intervention to improve on the following deficits: Decreased knowledge of precautions,  decreased knowledge of condition, increased edema, pain, postural dysfunction.   PT treatment/interventions: ADL/self-care home management, pt/family education, manual lymphatic drainage, compression bandaging, taping, vasopneumatic device, orthotic fit, therapeutic exercise  REHAB POTENTIAL: Excellent  CLINICAL DECISION MAKING: Stable/uncomplicated  EVALUATION COMPLEXITY: Low   GOALS: Goals reviewed with patient? YES  LONG TERM GOALS: (STG=LTG)    Name Target Date Goal status  1 Pt will be independent in self MLD for long term management of lymphedema. Baseline:  No knowledge 12/17/22 NEW  2 Pt will report a 50% improvement in L breast fibrosis to decrease risk of infection. Baseline:  No knowledge 12/17/22 NEW  3 Pt will be able to verbalize lymphedema risk reduction practices to decrease risk of worsening lymphedema. Baseline:  No knowledge 12/17/22 NEW    PLAN:  PT FREQUENCY/DURATION: 2x/wk for 4 wks  PLAN FOR NEXT SESSION: instruct in self MLD technique for L breast     Leonette Most, PT 12/03/2022, 11:59 AM

## 2022-12-05 ENCOUNTER — Encounter: Payer: Self-pay | Admitting: Physical Therapy

## 2022-12-05 ENCOUNTER — Ambulatory Visit: Admitting: Physical Therapy

## 2022-12-05 DIAGNOSIS — I890 Lymphedema, not elsewhere classified: Secondary | ICD-10-CM

## 2022-12-05 DIAGNOSIS — Z170 Estrogen receptor positive status [ER+]: Secondary | ICD-10-CM

## 2022-12-05 DIAGNOSIS — L599 Disorder of the skin and subcutaneous tissue related to radiation, unspecified: Secondary | ICD-10-CM

## 2022-12-05 NOTE — Therapy (Signed)
OUTPATIENT PHYSICAL THERAPY BREAST CANCER  TREATMENT   Patient Name: Isabel [name] MRN: [id number] DOB:Jul 04, 1960, 62 y.o., female Today's Date: 12/05/2022  END OF SESSION:  PT End of Session - 12/05/22 1106     Visit Number 4    Number of Visits 9    Date for PT Re-Evaluation 12/17/22    PT Start Time 1105    PT Stop Time 1151    PT Time Calculation (min) 46 min    Activity Tolerance Patient tolerated treatment well    Behavior During Therapy [facility] for tasks assessed/performed               Past Medical History:  Diagnosis Date   Breast cancer (HCC) 05/01/2022   GERD (gastroesophageal reflux disease)    HYPERGLYCEMIA    OBESITY    Pre-diabetes    Past Surgical History:  Procedure Laterality Date   AXILLARY SENTINEL NODE BIOPSY Left 05/28/2022   Procedure: LEFT AXILLARY SENTINEL NODE BIOPSY;  Surgeon: Manus Rudd, MD;  Location: [facility] OR;  Service: General;  Laterality: Left;   BREAST BIOPSY Left 04/30/2022   MM LT BREAST BX W LOC DEV 1ST LESION IMAGE BX SPEC STEREO GUIDE 04/30/2022 [facility]   BREAST BIOPSY  05/27/2022   MM LT RADIOACTIVE SEED LOC MAMMO GUIDE 05/27/2022 [facility]   BREAST LUMPECTOMY WITH RADIOACTIVE SEED AND SENTINEL LYMPH NODE BIOPSY Left 05/28/2022   Procedure: LEFT BREAST LUMPECTOMY WITH RADIOACTIVE SEED;  Surgeon: Manus Rudd, MD;  Location: [facility] OR;  Service: General;  Laterality: Left;   CESAREAN SECTION  1992   LAPAROSCOPIC CHOLECYSTECTOMY  2005   Patient Active Problem List   Diagnosis Date Noted   Malignant neoplasm of upper-inner quadrant of left breast in female, estrogen receptor positive (HCC) 05/13/2022   Pre-diabetes 04/01/2022   Routine general medical examination at a health care facility 09/29/2014   Obesity (BMI 35.0-39.9 without comorbidity) 04/18/2009    PCP: Dr. Hillard Danker  REFERRING PROVIDER: Ronny Bacon, PA-C  REFERRING DIAG: [phone] (ICD-10-CM) - Malignant neoplasm of  upper-inner quadrant of left breast in female, estrogen receptor positive (HCC)    THERAPY DIAG:  Lymphedema, not elsewhere classified  Disorder of the skin and subcutaneous tissue related to radiation, unspecified  Malignant neoplasm of upper-inner quadrant of left breast in female, estrogen receptor positive (HCC)  Rationale for Evaluation and Treatment: Rehabilitation  ONSET DATE: 05/13/22  SUBJECTIVE:  SUBJECTIVE STATEMENT: I have been practicing the self massage. I have been counting out loud to slow myself down.   PERTINENT HISTORY:  Patient was diagnosed with left grade 2 IDC. It is located in the upper inner quadrant. It is ER pos/PR neg and HER2 neg with a Ki67 of 10%. Underwent L lumpectomy and SLNB on 05/28/22 0/2 nodes  PATIENT GOALS:   reduce lymphedema risk and learn post op HEP.   PAIN:  Are you having pain? No  PRECAUTIONS: L UE/breast lymphedema risk  HAND DOMINANCE: right  WEIGHT BEARING RESTRICTIONS: No  FALLS:  Has patient fallen in last 6 months? Yes. Number of falls 1, fell in a hole/indent in the ground when going to the mailbox and broke 4 toes  LIVING ENVIRONMENT: Patient lives with: husband and niece, in a house  OCCUPATION: Retired   LEISURE: Elliptical 60 min per day  PRIOR LEVEL OF FUNCTION: Independent   OBJECTIVE:  COGNITION: Overall cognitive status: Within functional limits for tasks assessed    POSTURE:  Forward head and rounded shoulders posture  OBSERVATION/PALPATION:  L breast with peau d orange texture, increased fibrosis palpable in inferior and medial breast, increased scar tissue palpable at lumpectomy scar   UPPER EXTREMITY AROM/PROM: WFL  UPPER EXTREMITY STRENGTH: 5/5 strength   LYMPHEDEMA ASSESSMENTS:   LANDMARK RIGHT   eval  10 cm  proximal to olecranon process 31.9  Olecranon process 28.3  10 cm proximal to ulnar styloid process 23  Just proximal to ulnar styloid process 18.8  Across hand at thumb web space 20.5  At base of 2nd digit 7  (Blank rows = not tested)  LANDMARK LEFT   eval  10 cm proximal to olecranon process 32.6  Olecranon process 30.5  10 cm proximal to ulnar styloid process 23.1  Just proximal to ulnar styloid process 18.5  Across hand at thumb web space 20  At base of 2nd digit 7.4  (Blank rows = not tested)  L-DEX LYMPHEDEMA SCREENING:     The patient was assessed using the L-Dex machine today to produce a lymphedema index baseline score. The patient will be reassessed on a regular basis (typically every 3 months) to obtain new L-Dex scores. If the score is > 6.5 points away from his/her baseline score indicating onset of subclinical lymphedema, it will be recommended to wear a compression garment for 4 weeks, 12 hours per day and then be reassessed. If the score continues to be > 6.5 points from baseline at reassessment, we will initiate lymphedema treatment. Assessing in this manner has a 95% rate of preventing clinically significant lymphedema.  QUICK DASH SURVEY: 0%  BREAST COMPLAINTS: 22/80  TREATMENT: 12/05/22: In supine: Short neck, 5 diaphragmatic breaths, R axillary nodes and establishment of interaxillary pathway, L inguinal nodes and establishment of axilloinguinal pathway, then L breast moving fluid towards pathways spending extra time in any areas of fibrosis then retracing all steps. Had pt demonstrate each step of sequence and provided v/c for correct skin stretch but pt's speed has improved greatly since last session. She required less v/c today.   12/03/22: In supine: Short neck, 5 diaphragmatic breaths, R axillary nodes and establishment of interaxillary pathway, L inguinal nodes and establishment of axilloinguinal pathway, then L breast moving fluid towards pathways  spending extra time in any areas of fibrosis then retracing all steps. Educated pt throughout in anatomy and physiology of the lymphatic system and basics of self MLD. Had pt return demonstrate each step of  sequence and provided v/c and t/c for correct skin stretch, pressure and sequence and speed. Instructed pt in snow angel pec stretch x 5 reps, leaning in to flexion and abduction on wall for increased LUE stretch, and lower trunk rotation stretch with LUE extending with pt feeling strong stretch with this  11/26/22: In supine: Short neck, 5 diaphragmatic breaths, R axillary nodes and establishment of interaxillary pathway, L inguinal nodes and establishment of axilloinguinal pathway, then L breast moving fluid towards pathways spending extra time in any areas of fibrosis then retracing all steps. Educated pt throughout in anatomy and physiology of the lymphatic system and basics of self MLD. Had pt return demonstrate each step of sequence and provided v/c and t/c for correct skin stretch, pressure and sequence. Issued handout for pt to practice at home.   11/19/22: In supine: Short neck, 5 diaphragmatic breaths, R axillary nodes and establishment of interaxillary pathway, L inguinal nodes and establishment of axilloinguinal pathway, then L breast moving fluid towards pathways spending extra time in any areas of fibrosis then retracing all steps. Educated pt throughout in anatomy and physiology of the lymphatic system and basics of self MLD   PATIENT EDUCATION:  Education details:anatomy and physiology of lymphatic system and basic principles of self MLD, need for compression Person educated: Patient Education method: Programmer, multimedia, Demonstration Education comprehension: Patient verbalized understanding   HOME EXERCISE PROGRAM: Try to wear compression bra 24/7, wear compression when exercising, use chip pack in bra as tolerable  Lower trunk rotation stretch with LUE outstretched with 30-60 holds x  5  ASSESSMENT:  CLINICAL IMPRESSION: Continued to instruct pt in self MLD technique and had her  demonstrate what she has been doing at home. She demonstrated improved speed today but did require minimal v/c for correct skin stretch. Continued with MLD with focus on inner breast to decrease fibrosis.   Pt will benefit from skilled therapeutic intervention to improve on the following deficits: Decreased knowledge of precautions, decreased knowledge of condition, increased edema, pain, postural dysfunction.   PT treatment/interventions: ADL/self-care home management, pt/family education, manual lymphatic drainage, compression bandaging, taping, vasopneumatic device, orthotic fit, therapeutic exercise  REHAB POTENTIAL: Excellent  CLINICAL DECISION MAKING: Stable/uncomplicated  EVALUATION COMPLEXITY: Low   GOALS: Goals reviewed with patient? YES  LONG TERM GOALS: (STG=LTG)    Name Target Date Goal status  1 Pt will be independent in self MLD for long term management of lymphedema. Baseline:  No knowledge 12/17/22 NEW  2 Pt will report a 50% improvement in L breast fibrosis to decrease risk of infection. Baseline:  No knowledge 12/17/22 NEW  3 Pt will be able to verbalize lymphedema risk reduction practices to decrease risk of worsening lymphedema. Baseline:  No knowledge 12/17/22 NEW    PLAN:  PT FREQUENCY/DURATION: 2x/wk for 4 wks  PLAN FOR NEXT SESSION: instruct in self MLD technique for L breast     Leonette Most, PT 12/05/2022, 11:57 AM

## 2022-12-11 ENCOUNTER — Encounter: Admitting: Physical Therapy

## 2022-12-12 ENCOUNTER — Ambulatory Visit: Attending: Surgery

## 2022-12-12 DIAGNOSIS — Z170 Estrogen receptor positive status [ER+]: Secondary | ICD-10-CM | POA: Diagnosis present

## 2022-12-12 DIAGNOSIS — C50212 Malignant neoplasm of upper-inner quadrant of left female breast: Secondary | ICD-10-CM | POA: Insufficient documentation

## 2022-12-12 DIAGNOSIS — L599 Disorder of the skin and subcutaneous tissue related to radiation, unspecified: Secondary | ICD-10-CM | POA: Insufficient documentation

## 2022-12-12 DIAGNOSIS — I890 Lymphedema, not elsewhere classified: Secondary | ICD-10-CM | POA: Insufficient documentation

## 2022-12-12 NOTE — Therapy (Signed)
OUTPATIENT PHYSICAL THERAPY BREAST CANCER TREATMENT   Patient Name: Isabel [name] MRN: [id number] DOB:[birth year], 62 y.o., female Today's Date: 12/12/2022  END OF SESSION:  PT End of Session - 12/12/22 0915     Visit Number 5    Number of Visits 9    Date for PT Re-Evaluation 12/17/22    PT Start Time 0905    PT Stop Time 1006    PT Time Calculation (min) 61 min    Activity Tolerance Patient tolerated treatment well    Behavior During Therapy [facility] for tasks assessed/performed               Past Medical History:  Diagnosis Date   Breast cancer (HCC) 05/01/2022   GERD (gastroesophageal reflux disease)    HYPERGLYCEMIA    OBESITY    Pre-diabetes    Past Surgical History:  Procedure Laterality Date   AXILLARY SENTINEL NODE BIOPSY Left 05/28/2022   Procedure: LEFT AXILLARY SENTINEL NODE BIOPSY;  Surgeon: Manus Rudd, MD;  Location: [facility];  Service: General;  Laterality: Left;   BREAST BIOPSY Left 04/30/2022   MM LT BREAST BX W LOC DEV 1ST LESION IMAGE BX SPEC STEREO GUIDE 04/30/2022 [facility]   BREAST BIOPSY  05/27/2022   MM LT RADIOACTIVE SEED LOC MAMMO GUIDE 05/27/2022 [facility]   BREAST LUMPECTOMY WITH RADIOACTIVE SEED AND SENTINEL LYMPH NODE BIOPSY Left 05/28/2022   Procedure: LEFT BREAST LUMPECTOMY WITH RADIOACTIVE SEED;  Surgeon: Manus Rudd, MD;  Location: [facility];  Service: General;  Laterality: Left;   CESAREAN SECTION  1992   LAPAROSCOPIC CHOLECYSTECTOMY  2005   Patient Active Problem List   Diagnosis Date Noted   Malignant neoplasm of upper-inner quadrant of left breast in female, estrogen receptor positive (HCC) 05/13/2022   Pre-diabetes 04/01/2022   Routine general medical examination at a health care facility 09/29/2014   Obesity (BMI 35.0-39.9 without comorbidity) 04/18/2009    PCP: Dr. Hillard Danker  REFERRING PROVIDER: Ronny Bacon, PA-C  REFERRING DIAG: [phone] (ICD-10-CM) - Malignant neoplasm of  upper-inner quadrant of left breast in female, estrogen receptor positive (HCC)    THERAPY DIAG:  Lymphedema, not elsewhere classified  Disorder of the skin and subcutaneous tissue related to radiation, unspecified  Malignant neoplasm of upper-inner quadrant of left breast in female, estrogen receptor positive (HCC)  Rationale for Evaluation and Treatment: Rehabilitation  ONSET DATE: 05/13/22  SUBJECTIVE:                                                                                                                                                                                           SUBJECTIVE  STATEMENT: I've been doing the self MLD and it is helping.   PERTINENT HISTORY:  Patient was diagnosed with left grade 2 IDC. It is located in the upper inner quadrant. It is ER pos/PR neg and HER2 neg with a Ki67 of 10%. Underwent L lumpectomy and SLNB on 05/28/22 0/2 nodes  PATIENT GOALS:   reduce lymphedema risk and learn post op HEP.   PAIN:  Are you having pain? No  PRECAUTIONS: L UE/breast lymphedema risk  HAND DOMINANCE: right  WEIGHT BEARING RESTRICTIONS: No  FALLS:  Has patient fallen in last 6 months? Yes. Number of falls 1, fell in a hole/indent in the ground when going to the mailbox and broke 4 toes  LIVING ENVIRONMENT: Patient lives with: husband and niece, in a house  OCCUPATION: Retired   LEISURE: Elliptical 60 min per day  PRIOR LEVEL OF FUNCTION: Independent   OBJECTIVE:  COGNITION: Overall cognitive status: Within functional limits for tasks assessed    POSTURE:  Forward head and rounded shoulders posture  OBSERVATION/PALPATION:  L breast with peau d orange texture, increased fibrosis palpable in inferior and medial breast, increased scar tissue palpable at lumpectomy scar   UPPER EXTREMITY AROM/PROM: WFL  UPPER EXTREMITY STRENGTH: 5/5 strength   LYMPHEDEMA ASSESSMENTS:   LANDMARK RIGHT   eval  10 cm proximal to olecranon process 31.9   Olecranon process 28.3  10 cm proximal to ulnar styloid process 23  Just proximal to ulnar styloid process 18.8  Across hand at thumb web space 20.5  At base of 2nd digit 7  (Blank rows = not tested)  LANDMARK LEFT   eval  10 cm proximal to olecranon process 32.6  Olecranon process 30.5  10 cm proximal to ulnar styloid process 23.1  Just proximal to ulnar styloid process 18.5  Across hand at thumb web space 20  At base of 2nd digit 7.4  (Blank rows = not tested)  L-DEX LYMPHEDEMA SCREENING:     The patient was assessed using the L-Dex machine today to produce a lymphedema index baseline score. The patient will be reassessed on a regular basis (typically every 3 months) to obtain new L-Dex scores. If the score is > 6.5 points away from his/her baseline score indicating onset of subclinical lymphedema, it will be recommended to wear a compression garment for 4 weeks, 12 hours per day and then be reassessed. If the score continues to be > 6.5 points from baseline at reassessment, we will initiate lymphedema treatment. Assessing in this manner has a 95% rate of preventing clinically significant lymphedema.  QUICK DASH SURVEY: 0%  BREAST COMPLAINTS: 22/80  TREATMENT: 12/12/22: Self Care Took time at beginning of session to further explain lymphatics and how this applies to basic principles of MLD as pt was demonstrating good understanding of what she has been instructed in up to this point. Also answered her questions during this time.  Manual Therapy MLD to Lt breast as follows: Short neck, superficial and deep abdominals, Lt inguinal nodes and Lt axillo-inguinal anastomosis, then Rt axillary nodes, Rt intact anterior thorax, and anterior inter-axillary, next focused on Lt breast reviewing pressure with pt while performing and importance of skin stretch only, no slide in relation to our earlier discussion about the lymphatics.  P/ROM of Lt shoulder into flexion, abd and D2 to  available end motions and with scapular depression throughout by therapist. STM to Lt pect tendon that was palpably very tight and included Rt LTR for increased stretch educating pt  about incorporating this into HEP while she was performing.  Therapeutic Exercises Briefly at end of session had pt return demo of end Lt shoulder ROM stretches in doorway for her to also begin incorporating into her day.   12/05/22: In supine: Short neck, 5 diaphragmatic breaths, R axillary nodes and establishment of interaxillary pathway, L inguinal nodes and establishment of axilloinguinal pathway, then L breast moving fluid towards pathways spending extra time in any areas of fibrosis then retracing all steps. Had pt demonstrate each step of sequence and provided v/c for correct skin stretch but pt's speed has improved greatly since last session. She required less v/c today.   12/03/22: In supine: Short neck, 5 diaphragmatic breaths, R axillary nodes and establishment of interaxillary pathway, L inguinal nodes and establishment of axilloinguinal pathway, then L breast moving fluid towards pathways spending extra time in any areas of fibrosis then retracing all steps. Educated pt throughout in anatomy and physiology of the lymphatic system and basics of self MLD. Had pt return demonstrate each step of sequence and provided v/c and t/c for correct skin stretch, pressure and sequence and speed. Instructed pt in snow angel pec stretch x 5 reps, leaning in to flexion and abduction on wall for increased LUE stretch, and lower trunk rotation stretch with LUE extending with pt feeling strong stretch with this  11/26/22: In supine: Short neck, 5 diaphragmatic breaths, R axillary nodes and establishment of interaxillary pathway, L inguinal nodes and establishment of axilloinguinal pathway, then L breast moving fluid towards pathways spending extra time in any areas of fibrosis then retracing all steps. Educated pt throughout in  anatomy and physiology of the lymphatic system and basics of self MLD. Had pt return demonstrate each step of sequence and provided v/c and t/c for correct skin stretch, pressure and sequence. Issued handout for pt to practice at home.   11/19/22: In supine: Short neck, 5 diaphragmatic breaths, R axillary nodes and establishment of interaxillary pathway, L inguinal nodes and establishment of axilloinguinal pathway, then L breast moving fluid towards pathways spending extra time in any areas of fibrosis then retracing all steps. Educated pt throughout in anatomy and physiology of the lymphatic system and basics of self MLD   PATIENT EDUCATION:  Education details:anatomy and physiology of lymphatic system and basic principles of self MLD, need for compression Person educated: Patient Education method: Programmer, multimedia, Demonstration Education comprehension: Patient verbalized understanding   HOME EXERCISE PROGRAM: Try to wear compression bra 24/7, wear compression when exercising, use chip pack in bra as tolerable  Lower trunk rotation stretch with LUE outstretched with 30-60 holds x 5  ASSESSMENT:  CLINICAL IMPRESSION: Continued with Lt breast MLD and furthered pts understanding of the lymphatics in relation to principles of MLD to help improve her technique. Also included manual therapy to Lt upper quadrant where tightness palpable that is limiting to her end Lt shoulder A/ROM. Then added stretches for her to incorporate into her HEP to help decrease the Lt upper quadrant tightness.   Pt will benefit from skilled therapeutic intervention to improve on the following deficits: Decreased knowledge of precautions, decreased knowledge of condition, increased edema, pain, postural dysfunction.   PT treatment/interventions: ADL/self-care home management, pt/family education, manual lymphatic drainage, compression bandaging, taping, vasopneumatic device, orthotic fit, therapeutic exercise  REHAB  POTENTIAL: Excellent  CLINICAL DECISION MAKING: Stable/uncomplicated  EVALUATION COMPLEXITY: Low   GOALS: Goals reviewed with patient? YES  LONG TERM GOALS: (STG=LTG)    Name Target Date Goal status  1 Pt will be independent in self MLD for long term management of lymphedema. Baseline:  No knowledge 12/17/22 NEW  2 Pt will report a 50% improvement in L breast fibrosis to decrease risk of infection. Baseline:  No knowledge 12/17/22 NEW  3 Pt will be able to verbalize lymphedema risk reduction practices to decrease risk of worsening lymphedema. Baseline:  No knowledge 12/17/22 NEW    PLAN:  PT FREQUENCY/DURATION: 2x/wk for 4 wks  PLAN FOR NEXT SESSION: Cont self MLD for Lt breast and review prn. How are Lt UE stretches going at home?      Hermenia Bers, PTA 12/12/2022, 10:56 AM

## 2022-12-16 ENCOUNTER — Ambulatory Visit

## 2022-12-17 ENCOUNTER — Ambulatory Visit: Admitting: Physical Therapy

## 2022-12-19 ENCOUNTER — Ambulatory Visit: Admitting: Physical Therapy

## 2022-12-19 ENCOUNTER — Encounter: Payer: Self-pay | Admitting: Physical Therapy

## 2022-12-19 DIAGNOSIS — C50212 Malignant neoplasm of upper-inner quadrant of left female breast: Secondary | ICD-10-CM

## 2022-12-19 DIAGNOSIS — I890 Lymphedema, not elsewhere classified: Secondary | ICD-10-CM | POA: Diagnosis not present

## 2022-12-19 DIAGNOSIS — L599 Disorder of the skin and subcutaneous tissue related to radiation, unspecified: Secondary | ICD-10-CM

## 2022-12-19 NOTE — Therapy (Signed)
OUTPATIENT PHYSICAL THERAPY BREAST CANCER TREATMENT   Patient Name: Isabel [name] MRN: [id number] DOB:[birth year], 62 y.o., female Today's Date: 12/19/2022  END OF SESSION:  PT End of Session - 12/19/22 1007     Visit Number 6    Number of Visits 9    Date for PT Re-Evaluation 12/17/22    PT Start Time 1006    PT Stop Time 1105    PT Time Calculation (min) 59 min    Activity Tolerance Patient tolerated treatment well    Behavior During Therapy [facility] for tasks assessed/performed               Past Medical History:  Diagnosis Date   Breast cancer (HCC) 05/01/2022   GERD (gastroesophageal reflux disease)    HYPERGLYCEMIA    OBESITY    Pre-diabetes    Past Surgical History:  Procedure Laterality Date   AXILLARY SENTINEL NODE BIOPSY Left 05/28/2022   Procedure: LEFT AXILLARY SENTINEL NODE BIOPSY;  Surgeon: Manus Rudd, MD;  Location: [facility] OR;  Service: General;  Laterality: Left;   BREAST BIOPSY Left 04/30/2022   MM LT BREAST BX W LOC DEV 1ST LESION IMAGE BX SPEC STEREO GUIDE 04/30/2022 [facility]   BREAST BIOPSY  05/27/2022   MM LT RADIOACTIVE SEED LOC MAMMO GUIDE 05/27/2022 [facility]   BREAST LUMPECTOMY WITH RADIOACTIVE SEED AND SENTINEL LYMPH NODE BIOPSY Left 05/28/2022   Procedure: LEFT BREAST LUMPECTOMY WITH RADIOACTIVE SEED;  Surgeon: Manus Rudd, MD;  Location: [facility] OR;  Service: General;  Laterality: Left;   CESAREAN SECTION  1992   LAPAROSCOPIC CHOLECYSTECTOMY  2005   Patient Active Problem List   Diagnosis Date Noted   Malignant neoplasm of upper-inner quadrant of left breast in female, estrogen receptor positive (HCC) 05/13/2022   Pre-diabetes 04/01/2022   Routine general medical examination at a health care facility 09/29/2014   Obesity (BMI 35.0-39.9 without comorbidity) 04/18/2009    PCP: Dr. Hillard Danker  REFERRING PROVIDER: Ronny Bacon, PA-C  REFERRING DIAG: [phone] (ICD-10-CM) - Malignant neoplasm of  upper-inner quadrant of left breast in female, estrogen receptor positive (HCC)    THERAPY DIAG:  Lymphedema, not elsewhere classified  Disorder of the skin and subcutaneous tissue related to radiation, unspecified  Malignant neoplasm of upper-inner quadrant of left breast in female, estrogen receptor positive (HCC)  Rationale for Evaluation and Treatment: Rehabilitation  ONSET DATE: 05/13/22  SUBJECTIVE:                                                                                                                                                                                           SUBJECTIVE  STATEMENT: I think I am doing good.   PERTINENT HISTORY:  Patient was diagnosed with left grade 2 IDC. It is located in the upper inner quadrant. It is ER pos/PR neg and HER2 neg with a Ki67 of 10%. Underwent L lumpectomy and SLNB on 05/28/22 0/2 nodes  PATIENT GOALS:   reduce lymphedema risk and learn post op HEP.   PAIN:  Are you having pain? No  PRECAUTIONS: L UE/breast lymphedema risk  HAND DOMINANCE: right  WEIGHT BEARING RESTRICTIONS: No  FALLS:  Has patient fallen in last 6 months? Yes. Number of falls 1, fell in a hole/indent in the ground when going to the mailbox and broke 4 toes  LIVING ENVIRONMENT: Patient lives with: husband and niece, in a house  OCCUPATION: Retired   LEISURE: Elliptical 60 min per day  PRIOR LEVEL OF FUNCTION: Independent   OBJECTIVE:  COGNITION: Overall cognitive status: Within functional limits for tasks assessed    POSTURE:  Forward head and rounded shoulders posture  OBSERVATION/PALPATION:  L breast with peau d orange texture, increased fibrosis palpable in inferior and medial breast, increased scar tissue palpable at lumpectomy scar   UPPER EXTREMITY AROM/PROM: WFL  UPPER EXTREMITY STRENGTH: 5/5 strength   LYMPHEDEMA ASSESSMENTS:   LANDMARK RIGHT   eval  10 cm proximal to olecranon process 31.9  Olecranon process 28.3  10  cm proximal to ulnar styloid process 23  Just proximal to ulnar styloid process 18.8  Across hand at thumb web space 20.5  At base of 2nd digit 7  (Blank rows = not tested)  LANDMARK LEFT   eval  10 cm proximal to olecranon process 32.6  Olecranon process 30.5  10 cm proximal to ulnar styloid process 23.1  Just proximal to ulnar styloid process 18.5  Across hand at thumb web space 20  At base of 2nd digit 7.4  (Blank rows = not tested)  L-DEX LYMPHEDEMA SCREENING:   L-DEX FLOWSHEETS - 12/19/22 1000       L-DEX LYMPHEDEMA SCREENING   Measurement Type Unilateral    L-DEX MEASUREMENT EXTREMITY Upper Extremity    POSITION  Standing    DOMINANT SIDE Right    At Risk Side Left    BASELINE SCORE (UNILATERAL) 5    L-DEX SCORE (UNILATERAL) 3.4    VALUE CHANGE (UNILAT) -1.6              The patient was assessed using the L-Dex machine today to produce a lymphedema index baseline score. The patient will be reassessed on a regular basis (typically every 3 months) to obtain new L-Dex scores. If the score is > 6.5 points away from his/her baseline score indicating onset of subclinical lymphedema, it will be recommended to wear a compression garment for 4 weeks, 12 hours per day and then be reassessed. If the score continues to be > 6.5 points from baseline at reassessment, we will initiate lymphedema treatment. Assessing in this manner has a 95% rate of preventing clinically significant lymphedema.  QUICK DASH SURVEY: 0%  BREAST COMPLAINTS: 22/80  TREATMENT: 12/19/22: Assessed progress towards goals in therapy MLD: In supine: Short neck, 5 diaphragmatic breaths, R axillary nodes and establishment of interaxillary pathway, L inguinal nodes and establishment of axilloinguinal pathway, then L breast moving fluid towards pathways spending extra time in any areas of fibrosis then retracing all steps. Had pt demonstrate how she is doing MLD at home. She had the sequence correct but  required some v/c for slower speed and  to use whole hand instead of finger tips to keep from applying too much pressure and for better skin stretch. Pt able to demonstrate independence by end of session.  Repeated SOZO.   L-DEX FLOWSHEETS - 12/19/22 1000       L-DEX LYMPHEDEMA SCREENING   Measurement Type Unilateral    L-DEX MEASUREMENT EXTREMITY Upper Extremity    POSITION  Standing    DOMINANT SIDE Right    At Risk Side Left    BASELINE SCORE (UNILATERAL) 5    L-DEX SCORE (UNILATERAL) 3.4    VALUE CHANGE (UNILAT) -1.6            The patient was assessed using the L-Dex machine today to produce a lymphedema index baseline score. The patient will be reassessed on a regular basis (typically every 3 months) to obtain new L-Dex scores. If the score is > 6.5 points away from his/her baseline score indicating onset of subclinical lymphedema, it will be recommended to wear a compression garment for 4 weeks, 12 hours per day and then be reassessed. If the score continues to be > 6.5 points from baseline at reassessment, we will initiate lymphedema treatment. Assessing in this manner has a 95% rate of preventing clinically significant lymphedema.   12/12/22: Self Care Took time at beginning of session to further explain lymphatics and how this applies to basic principles of MLD as pt was demonstrating good understanding of what she has been instructed in up to this point. Also answered her questions during this time.  Manual Therapy MLD to Lt breast as follows: Short neck, superficial and deep abdominals, Lt inguinal nodes and Lt axillo-inguinal anastomosis, then Rt axillary nodes, Rt intact anterior thorax, and anterior inter-axillary, next focused on Lt breast reviewing pressure with pt while performing and importance of skin stretch only, no slide in relation to our earlier discussion about the lymphatics.  P/ROM of Lt shoulder into flexion, abd and D2 to available end motions and with scapular  depression throughout by therapist. STM to Lt pect tendon that was palpably very tight and included Rt LTR for increased stretch educating pt about incorporating this into HEP while she was performing.  Therapeutic Exercises Briefly at end of session had pt return demo of end Lt shoulder ROM stretches in doorway for her to also begin incorporating into her day.   12/05/22: In supine: Short neck, 5 diaphragmatic breaths, R axillary nodes and establishment of interaxillary pathway, L inguinal nodes and establishment of axilloinguinal pathway, then L breast moving fluid towards pathways spending extra time in any areas of fibrosis then retracing all steps. Had pt demonstrate each step of sequence and provided v/c for correct skin stretch but pt's speed has improved greatly since last session. She required less v/c today.   12/03/22: In supine: Short neck, 5 diaphragmatic breaths, R axillary nodes and establishment of interaxillary pathway, L inguinal nodes and establishment of axilloinguinal pathway, then L breast moving fluid towards pathways spending extra time in any areas of fibrosis then retracing all steps. Educated pt throughout in anatomy and physiology of the lymphatic system and basics of self MLD. Had pt return demonstrate each step of sequence and provided v/c and t/c for correct skin stretch, pressure and sequence and speed. Instructed pt in snow angel pec stretch x 5 reps, leaning in to flexion and abduction on wall for increased LUE stretch, and lower trunk rotation stretch with LUE extending with pt feeling strong stretch with this  11/26/22: In supine: Short  neck, 5 diaphragmatic breaths, R axillary nodes and establishment of interaxillary pathway, L inguinal nodes and establishment of axilloinguinal pathway, then L breast moving fluid towards pathways spending extra time in any areas of fibrosis then retracing all steps. Educated pt throughout in anatomy and physiology of the lymphatic  system and basics of self MLD. Had pt return demonstrate each step of sequence and provided v/c and t/c for correct skin stretch, pressure and sequence. Issued handout for pt to practice at home.   11/19/22: In supine: Short neck, 5 diaphragmatic breaths, R axillary nodes and establishment of interaxillary pathway, L inguinal nodes and establishment of axilloinguinal pathway, then L breast moving fluid towards pathways spending extra time in any areas of fibrosis then retracing all steps. Educated pt throughout in anatomy and physiology of the lymphatic system and basics of self MLD   PATIENT EDUCATION:  Education details:anatomy and physiology of lymphatic system and basic principles of self MLD, need for compression Person educated: Patient Education method: Programmer, multimedia, Demonstration Education comprehension: Patient verbalized understanding   HOME EXERCISE PROGRAM: Try to wear compression bra 24/7, wear compression when exercising, use chip pack in bra as tolerable  Lower trunk rotation stretch with LUE outstretched with 30-60 holds x 5  ASSESSMENT:  CLINICAL IMPRESSION: Pt reports she feels independent with self MLD and has been stretching at home and feels less tight. Pt has now met all goals for therapy and is ready for discharge from PT services at this time. She will continue to wear her compression bra and do self mld daily for L breat lymphedema.   Pt will benefit from skilled therapeutic intervention to improve on the following deficits: Decreased knowledge of precautions, decreased knowledge of condition, increased edema, pain, postural dysfunction.   PT treatment/interventions: ADL/self-care home management, pt/family education, manual lymphatic drainage, compression bandaging, taping, vasopneumatic device, orthotic fit, therapeutic exercise  REHAB POTENTIAL: Excellent  CLINICAL DECISION MAKING: Stable/uncomplicated  EVALUATION COMPLEXITY: Low   GOALS: Goals reviewed with  patient? YES  LONG TERM GOALS: (STG=LTG)    Name Target Date Goal status  1 Pt will be independent in self MLD for long term management of lymphedema. Baseline:  No knowledge 12/17/22 12/19/22- MET   2 Pt will report a 50% improvement in L breast fibrosis to decrease risk of infection. Baseline:  No knowledge 12/17/22 12/19/22- 50% improved  3 Pt will be able to verbalize lymphedema risk reduction practices to decrease risk of worsening lymphedema. Baseline:  No knowledge 12/17/22 MET 12/19/22    PLAN:  PT FREQUENCY/DURATION: 2x/wk for 4 wks  PLAN FOR NEXT SESSION: d/c this visit     Leonette Most, PT 12/19/2022, 11:26 AM  PHYSICAL THERAPY DISCHARGE SUMMARY  Visits from Start of Care: 6  Current functional level related to goals / functional outcomes: All goals met   Remaining deficits: None   Education / Equipment: HEP, self MLD, compression, lymphedema risk reduction practices   Patient agrees to discharge. Patient goals were met. Patient is being discharged due to meeting the stated rehab goals.  Milagros Loll [place],  12/19/22 11:26 AM

## 2022-12-24 ENCOUNTER — Encounter: Admitting: Physical Therapy

## 2022-12-26 ENCOUNTER — Encounter

## 2022-12-30 ENCOUNTER — Ambulatory Visit

## 2023-01-10 NOTE — Progress Notes (Signed)
  Radiation Oncology         [phone] ________________________________  Name: Isabel [name] MRN: [id number]  Date of Service: 01/10/2023 DOB: 08/17/1960  Post Treatment Telephone Note  Diagnosis:  Stage IA, [id number], grade 2, ER positive, invasive ductal carcinoma of the left breast with no residual disease at the time of lumpectomy (as documented in provider EOT note)  The patient was available for call today.   Symptoms of fatigue have improved since completing therapy.  Symptoms of skin changes have improved since completing therapy.  The patient was encouraged to avoid sun exposure in the area of prior treatment for up to one year following radiation with either sunscreen or by the style of clothing worn in the sun.  The patient has scheduled follow up with her medical oncologist Dr. Al Pimple for ongoing surveillance, and was encouraged to call if she develops concerns or questions regarding radiation.   This concludes the interaction.  Ruel Favors, LPN

## 2023-01-13 ENCOUNTER — Ambulatory Visit
Admission: RE | Admit: 2023-01-13 | Discharge: 2023-01-13 | Disposition: A | Discharge status: Home / Self Care | Source: Ambulatory Visit | Attending: Radiation Oncology | Admitting: Radiation Oncology

## 2023-01-21 ENCOUNTER — Encounter: Payer: Self-pay | Admitting: Adult Health

## 2023-01-21 ENCOUNTER — Inpatient Hospital Stay: Attending: Hematology and Oncology | Admitting: Adult Health

## 2023-01-21 VITALS — BP 142/69 | HR 103 | Temp 97.9°F | Resp 18 | Wt 205.1 lb

## 2023-01-21 DIAGNOSIS — Z803 Family history of malignant neoplasm of breast: Secondary | ICD-10-CM | POA: Diagnosis not present

## 2023-01-21 DIAGNOSIS — Z79811 Long term (current) use of aromatase inhibitors: Secondary | ICD-10-CM | POA: Diagnosis not present

## 2023-01-21 DIAGNOSIS — Z923 Personal history of irradiation: Secondary | ICD-10-CM | POA: Diagnosis not present

## 2023-01-21 DIAGNOSIS — Z1732 Human epidermal growth factor receptor 2 negative status: Secondary | ICD-10-CM | POA: Insufficient documentation

## 2023-01-21 DIAGNOSIS — Z170 Estrogen receptor positive status [ER+]: Secondary | ICD-10-CM | POA: Insufficient documentation

## 2023-01-21 DIAGNOSIS — Z1722 Progesterone receptor negative status: Secondary | ICD-10-CM | POA: Diagnosis not present

## 2023-01-21 DIAGNOSIS — Z8042 Family history of malignant neoplasm of prostate: Secondary | ICD-10-CM | POA: Insufficient documentation

## 2023-01-21 DIAGNOSIS — C50212 Malignant neoplasm of upper-inner quadrant of left female breast: Secondary | ICD-10-CM | POA: Insufficient documentation

## 2023-01-21 NOTE — Progress Notes (Signed)
SURVIVORSHIP VISIT:  BRIEF ONCOLOGIC HISTORY:  Oncology History  Malignant neoplasm of upper-inner quadrant of left breast in female, estrogen receptor positive (HCC)  04/02/2022 Mammogram   In the left breast a possible asymmetry warrants further evaluation.  In the right breast, no findings suspicious for malignancy.  Further evaluation is suggested for possible asymmetry in the left breast.  Ultrasound breast showed indeterminate left breast focal asymmetry with possible distortion.    Pathology Results   Left breast needle core biopsy showed invasive ductal carcinoma overall grade 2, DCIS intermediate to high-grade, prognostic showed ER 100% positive strong staining PR 0% negative, Ki-67 of 10% and group 5 HER2 negative   05/28/2022 Definitive Surgery   Left lumpectomy showed no evidence of residual malignancy, 2 axillary lymph nodes negative for carcinoma.  Original biopsy showed 7 mm invasive ductal carcinoma.   05/28/2022 Cancer Staging   Staging form: Breast, AJCC 8th Edition - Pathologic stage from 05/28/2022: Stage IA (pT1b, pN0, cM0, G2, ER+, PR-, HER2-) - Signed by Loa Socks, NP on 06/27/2022 Histologic grading system: 3 grade system   06/06/2022 Oncotype testing   36/24%, >15% absolute chemotherapy benefit   06/27/2022 -  Chemotherapy   Patient is on Treatment Plan : BREAST TC q21d     09/25/2022 - 10/23/2022 Radiation Therapy   Plan Name: Breast_L_BH Site: Breast, Left Technique: 3D Mode: Photon Dose Per Fraction: 2.66 Gy Prescribed Dose (Delivered / Prescribed): 42.56 Gy / 42.56 Gy Prescribed Fxs (Delivered / Prescribed): 16 / 16   Plan Name: Brst_L_Bst_BH Site: Breast, Left Technique: 3D Mode: Photon Dose Per Fraction: 2 Gy Prescribed Dose (Delivered / Prescribed): 8 Gy / 8 Gy Prescribed Fxs (Delivered / Prescribed): 4 / 4   10/2022 -  Anti-estrogen oral therapy   Anastrozole     INTERVAL HISTORY:  Ms. Bradish to review her survivorship care plan  detailing her treatment course for breast cancer, as well as monitoring long-term side effects of that treatment, education regarding health maintenance, screening, and overall wellness and health promotion.     Overall, Ms. Messing reports feeling quite well.  She is taking anastrozole without difficulty.    REVIEW OF SYSTEMS:  Review of Systems  Constitutional:  Negative for appetite change, chills, fatigue, fever and unexpected weight change.  HENT:   Negative for hearing loss, lump/mass and trouble swallowing.   Eyes:  Negative for eye problems and icterus.  Respiratory:  Negative for chest tightness, cough and shortness of breath.   Cardiovascular:  Negative for chest pain, leg swelling and palpitations.  Gastrointestinal:  Negative for abdominal distention, abdominal pain, constipation, diarrhea, nausea and vomiting.  Endocrine: Negative for hot flashes.  Genitourinary:  Negative for difficulty urinating.   Musculoskeletal:  Negative for arthralgias.  Skin:  Negative for itching and rash.  Neurological:  Negative for dizziness, extremity weakness, headaches and numbness.  Hematological:  Negative for adenopathy. Does not bruise/bleed easily.  Psychiatric/Behavioral:  Negative for depression. The patient is not nervous/anxious.    Breast: Denies any new nodularity, masses, tenderness, nipple changes, or nipple discharge.       PAST MEDICAL/SURGICAL HISTORY:  Past Medical History:  Diagnosis Date   Breast cancer (HCC) 05/01/2022   GERD (gastroesophageal reflux disease)    HYPERGLYCEMIA    OBESITY    Pre-diabetes    Past Surgical History:  Procedure Laterality Date   AXILLARY SENTINEL NODE BIOPSY Left 05/28/2022   Procedure: LEFT AXILLARY SENTINEL NODE BIOPSY;  Surgeon: Manus Rudd, MD;  Location:  [facility];  Service: General;  Laterality: Left;   BREAST BIOPSY Left 04/30/2022   MM LT BREAST BX W LOC DEV 1ST LESION IMAGE BX SPEC STEREO GUIDE 04/30/2022 [facility]    BREAST BIOPSY  05/27/2022   MM LT RADIOACTIVE SEED LOC MAMMO GUIDE 05/27/2022 [facility]   BREAST LUMPECTOMY WITH RADIOACTIVE SEED AND SENTINEL LYMPH NODE BIOPSY Left 05/28/2022   Procedure: LEFT BREAST LUMPECTOMY WITH RADIOACTIVE SEED;  Surgeon: Manus Rudd, MD;  Location: [facility] OR;  Service: General;  Laterality: Left;   CESAREAN SECTION  1992   LAPAROSCOPIC CHOLECYSTECTOMY  2005     ALLERGIES:  No Known Allergies   CURRENT MEDICATIONS:  Outpatient Encounter Medications as of 01/21/2023  Medication Sig   anastrozole (ARIMIDEX) 1 MG tablet Take 1 tablet (1 mg total) by mouth daily.   aspirin EC 81 MG tablet Take 81 mg by mouth daily.   Calcium Carb-Cholecalciferol (CALCIUM-VITAMIN D) 500-200 MG-UNIT tablet Take 1 tablet by mouth daily.   Multiple Vitamins-Calcium (ONE-A-DAY WOMENS FORMULA) TABS Take 1 tablet by mouth daily.   Multiple Vitamins-Minerals (EMERGEN-C VITAMIN C LITE) PACK Take by mouth.   Omega-3 Fatty Acids (FISH OIL) 1000 MG CAPS Take 1,000 mg by mouth daily.   VITAJOY BIOTIN GUMMIES PO Take by mouth daily.   VITAMIN E PO Take 1 capsule by mouth daily.   No facility-administered encounter medications on file as of 01/21/2023.     ONCOLOGIC FAMILY HISTORY:  Family History  Problem Relation Age of Onset   Prostate cancer Father    Breast cancer Maternal Aunt 8   Stroke Maternal Grandmother    Hypertension Brother    Kidney disease Brother    Colon cancer Neg Hx    Stomach cancer Neg Hx      SOCIAL HISTORY:  Social History   Socioeconomic History   Marital status: Married    Spouse name: Not on file   Number of children: Not on file   Years of education: Not on file   Highest education level: Not on file  Occupational History   Not on file  Tobacco Use   Smoking status: Never   Smokeless tobacco: Never   Tobacco comments:    Married, 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  Vaping Use   Vaping status: Never Used   Substance and Sexual Activity   Alcohol use: Not Currently    Alcohol/week: 2.0 standard drinks of alcohol    Types: 2 Glasses of wine per week   Drug use: No   Sexual activity: Not on file  Other Topics Concern   Not on file  Social History Narrative   Not on file   Social Drivers of Health   Financial Resource Strain: Not on file  Food Insecurity: No Food Insecurity (09/12/2022)   Hunger Vital Sign    Worried About Running Out of Food in the Last Year: Never true    Ran Out of Food in the Last Year: Never true  Transportation Needs: No Transportation Needs (09/12/2022)   PRAPARE - Administrator, Civil Service (Medical): No    Lack of Transportation (Non-Medical): No  Physical Activity: Not on file  Stress: Not on file  Social Connections: Not on file  Intimate Partner Violence: Not At Risk (09/12/2022)   Humiliation, Afraid, Rape, and Kick questionnaire    Fear of Current or Ex-Partner: No    Emotionally Abused: No    Physically Abused: No  Sexually Abused: No     OBSERVATIONS/OBJECTIVE:  BP (!) 142/69 (BP Location: Right Arm, Patient Position: Sitting)   Pulse (!) 103   Temp 97.9 F (36.6 C) (Temporal)   Resp 18   Wt 205 lb 2 oz (93 kg)   LMP 11/22/2011   SpO2 100%   BMI 33.11 kg/m  GENERAL: Patient is a well appearing female in no acute distress HEENT:  Sclerae anicteric.  Oropharynx clear and moist. No ulcerations or evidence of oropharyngeal candidiasis. Neck is supple.  NODES:  No cervical, supraclavicular, or axillary lymphadenopathy palpated.  BREAST EXAM:  Deferred. LUNGS:  Clear to auscultation bilaterally.  No wheezes or rhonchi. HEART:  Regular rate and rhythm. No murmur appreciated. ABDOMEN:  Soft, nontender.  Positive, normoactive bowel sounds. No organomegaly palpated. MSK:  No focal spinal tenderness to palpation. Full range of motion bilaterally in the upper extremities. EXTREMITIES:  No peripheral edema.   SKIN:  Clear with no obvious  rashes or skin changes. No nail dyscrasia. NEURO:  Nonfocal. Well oriented.  Appropriate affect.   LABORATORY DATA:  None for this visit.  DIAGNOSTIC IMAGING:  None for this visit.      ASSESSMENT AND PLAN:  Ms.. Kingsley is a pleasant 62 y.o. female with Stage IA left breast invasive ductal carcinoma, ER+/PR-/HER2-, diagnosed in 03/2022, treated with lumpectomy, adjuvant chemotherapy, adjuvant radiation therapy, and anti-estrogen therapy with Anastrozole beginning in 10/2022.  She presents to the Survivorship Clinic for our initial meeting and routine follow-up post-completion of treatment for breast cancer.    1. Stage IA left breast cancer:  Ms. Thornock is continuing to recover from definitive treatment for breast cancer. She will follow-up with her medical oncologist, Dr.  Al Pimple in 6 months with history and physical exam per surveillance protocol.  She will continue her anti-estrogen therapy with Anastrozole. Thus far, she is tolerating the Anastrozole well, with minimal side effects. Her mammogram is due 03/2023; orders placed today.   Today, a comprehensive survivorship care plan and treatment summary was reviewed with the patient today detailing her breast cancer diagnosis, treatment course, potential late/long-term effects of treatment, appropriate follow-up care with recommendations for the future, and patient education resources.  A copy of this summary, along with a letter will be sent to the patient's primary care provider via mail/fax/In Basket message after today's visit.    2. Bone health:  Given Ms. Finlay's age/history of breast cancer and her current treatment regimen including anti-estrogen therapy with Anastrozole, she is at risk for bone demineralization. She is scheduled for bone density testing in 06/2023. She was given education on specific activities to promote bone health.  3. Cancer screening:  Due to Ms. Teel's history and her age, she should receive screening for skin  cancers, colon cancer, and gynecologic cancers.  The information and recommendations are listed on the patient's comprehensive care plan/treatment summary and were reviewed in detail with the patient.    4. Health maintenance and wellness promotion: Ms. Sibert was encouraged to consume 5-7 servings of fruits and vegetables per day. We reviewed the "Nutrition Rainbow" handout.  She was also encouraged to engage in moderate to vigorous exercise for 30 minutes per day most days of the week.  She was instructed to limit her alcohol consumption and continue to abstain from tobacco use.     5. Support services/counseling: It is not uncommon for this period of the patient's cancer care trajectory to be one of many emotions and stressors.   She  was given information regarding our available services and encouraged to contact me with any questions or for help enrolling in any of our support group/programs.    Follow up instructions:    -Return to cancer center in 6 months for f/u with Dr. Al Pimple  -Mammogram due in 03/2023 -Bone density testing in 06/2023 -She is welcome to return back to the Survivorship Clinic at any time; no additional follow-up needed at this time.  -Consider referral back to survivorship as a long-term survivor for continued surveillance  The patient was provided an opportunity to ask questions and all were answered. The patient agreed with the plan and demonstrated an understanding of the instructions.   Total encounter time:50 minutes*in face-to-face visit time, chart review, lab review, care coordination, order entry, and documentation of the encounter time.    Lillard Anes, NP 01/21/23 12:13 PM Medical Oncology and Hematology [facility] [address] [place], [place] [address] Tel. [phone]    Fax. [phone]  *Total Encounter Time as defined by the Centers for Medicare and Medicaid Services includes, in addition to the face-to-face time of a patient  visit (documented in the note above) non-face-to-face time: obtaining and reviewing outside history, ordering and reviewing medications, tests or procedures, care coordination (communications with other health care professionals or caregivers) and documentation in the medical record.

## 2023-03-03 ENCOUNTER — Ambulatory Visit (AMBULATORY_SURGERY_CENTER)

## 2023-03-03 ENCOUNTER — Other Ambulatory Visit: Payer: Self-pay

## 2023-03-03 VITALS — Ht 66.0 in | Wt 206.0 lb

## 2023-03-03 DIAGNOSIS — Z860100 Personal history of colon polyps, unspecified: Secondary | ICD-10-CM

## 2023-03-03 MED ORDER — SUFLAVE 178.7 G PO SOLR: 1.0000 | ORAL | 0 refills | Status: AC

## 2023-03-03 NOTE — Progress Notes (Signed)
Denies allergies to eggs or soy products. Denies complication of anesthesia or sedation. Denies use of weight loss medication. Denies use of O2.   Emmi instructions given for colonoscopy.    Patient did not want to use [facility]. So prep was sent to her local [facility].

## 2023-03-27 NOTE — Therapy (Signed)
  OUTPATIENT PHYSICAL THERAPY SOZO SCREENING NOTE   Patient Name: Isabel [name] MRN: [id number] DOB:09-03-1960, 63 y.o., female Today's Date: 03/31/2023  PCP: [name], MD REFERRING PROVIDER: Manus Rudd, MD   PT End of Session - 03/31/23 [phone]     Visit Number 5   unchanged due to screen only   PT Start Time 0823    PT Stop Time 0826    PT Time Calculation (min) 3 min             Past Medical History:  Diagnosis Date   Breast cancer (HCC) 05/01/2022   GERD (gastroesophageal reflux disease)    HYPERGLYCEMIA    OBESITY    Pre-diabetes    Past Surgical History:  Procedure Laterality Date   AXILLARY SENTINEL NODE BIOPSY Left 05/28/2022   Procedure: LEFT AXILLARY SENTINEL NODE BIOPSY;  Surgeon: Manus Rudd, MD;  Location: [facility];  Service: General;  Laterality: Left;   BREAST BIOPSY Left 04/30/2022   MM LT BREAST BX W LOC DEV 1ST LESION IMAGE BX SPEC STEREO GUIDE 04/30/2022 [facility]   BREAST BIOPSY  05/27/2022   MM LT RADIOACTIVE SEED LOC MAMMO GUIDE 05/27/2022 [facility]   BREAST LUMPECTOMY WITH RADIOACTIVE SEED AND SENTINEL LYMPH NODE BIOPSY Left 05/28/2022   Procedure: LEFT BREAST LUMPECTOMY WITH RADIOACTIVE SEED;  Surgeon: Manus Rudd, MD;  Location: [facility];  Service: General;  Laterality: Left;   CESAREAN SECTION  1992   LAPAROSCOPIC CHOLECYSTECTOMY  2005   Patient Active Problem List   Diagnosis Date Noted   Malignant neoplasm of upper-inner quadrant of left breast in female, estrogen receptor positive (HCC) 05/13/2022   Pre-diabetes 04/01/2022   Routine general medical examination at a health care facility 09/29/2014   Obesity (BMI 35.0-39.9 without comorbidity) 04/18/2009    REFERRING DIAG: left breast cancer at risk for lymphedema  THERAPY DIAG:  Malignant neoplasm of upper-inner quadrant of left breast in female, estrogen receptor positive (HCC)  PERTINENT HISTORY: Patient was diagnosed with left grade 2 IDC. It is located  in the upper inner quadrant. It is ER pos/PR neg and HER2 neg with a Ki67 of 10%. Underwent L lumpectomy and SLNB on 05/28/22 0/2 nodes  PRECAUTIONS: left UE Lymphedema risk  SUBJECTIVE: Here for 3 month SOZO screening.   PAIN:  Are you having pain? No  SOZO SCREENING: Patient was assessed today using the SOZO machine to determine the lymphedema index score. This was compared to her baseline score. It was determined that she is within the recommended range when compared to her baseline and no further action is needed at this time. She will continue SOZO screenings. These are done every 3 months for 2 years post operatively followed by every 6 months for 2 years, and then annually.   L-DEX FLOWSHEETS - 03/31/23 0800       L-DEX LYMPHEDEMA SCREENING   Measurement Type Unilateral    L-DEX MEASUREMENT EXTREMITY Upper Extremity    POSITION  Standing    DOMINANT SIDE Right    At Risk Side Left    BASELINE SCORE (UNILATERAL) 5    L-DEX SCORE (UNILATERAL) 4.7    VALUE CHANGE (UNILAT) -0.3            P: 3 month    [employer], PT 03/31/2023, 8:27 AM

## 2023-03-31 ENCOUNTER — Ambulatory Visit: Attending: Surgery | Admitting: Physical Therapy

## 2023-03-31 DIAGNOSIS — Z170 Estrogen receptor positive status [ER+]: Secondary | ICD-10-CM | POA: Insufficient documentation

## 2023-03-31 DIAGNOSIS — C50212 Malignant neoplasm of upper-inner quadrant of left female breast: Secondary | ICD-10-CM | POA: Insufficient documentation

## 2023-04-02 ENCOUNTER — Ambulatory Visit (AMBULATORY_SURGERY_CENTER): Admitting: Internal Medicine

## 2023-04-02 ENCOUNTER — Encounter: Payer: Self-pay | Admitting: Internal Medicine

## 2023-04-02 VITALS — BP 157/64 | HR 87 | Temp 98.2°F | Resp 18 | Ht 66.0 in | Wt 206.0 lb

## 2023-04-02 DIAGNOSIS — K5730 Diverticulosis of large intestine without perforation or abscess without bleeding: Secondary | ICD-10-CM

## 2023-04-02 DIAGNOSIS — Z1211 Encounter for screening for malignant neoplasm of colon: Secondary | ICD-10-CM | POA: Diagnosis present

## 2023-04-02 MED ORDER — SODIUM CHLORIDE 0.9 % IV SOLN: 500.0000 mL | INTRAVENOUS | Status: DC

## 2023-04-02 NOTE — Progress Notes (Signed)
 Patient states there have been no changes to medical or surgical history since time of pre-visit.

## 2023-04-02 NOTE — Op Note (Signed)
 [facility] Patient Name: Isabel [name] Procedure Date: 04/02/2023 10:28 AM MRN: [id number] Endoscopist: Beverley Fiedler , MD, [id number] Age: 63 Referring MD:  Date of Birth: 1960-05-06 Gender: Female Account #: [id number] Procedure:                Colonoscopy Indications:              Screening for colorectal malignant neoplasm, Last                            colonoscopy: December 2013 Medicines:                Monitored Anesthesia Care Procedure:                Pre-Anesthesia Assessment:                           - Prior to the procedure, a History and Physical                            was performed, and patient medications and                            allergies were reviewed. The patient's tolerance of                            previous anesthesia was also reviewed. The risks                            and benefits of the procedure and the sedation                            options and risks were discussed with the patient.                            All questions were answered, and informed consent                            was obtained. Prior Anticoagulants: The patient has                            taken no anticoagulant or antiplatelet agents. ASA                            Grade Assessment: III - A patient with severe                            systemic disease. After reviewing the risks and                            benefits, the patient was deemed in satisfactory                            condition to undergo the procedure.  After obtaining informed consent, the colonoscope                            was passed under direct vision. Throughout the                            procedure, the patient's blood pressure, pulse, and                            oxygen saturations were monitored continuously. The                            CF [id number] #[id number] was introduced through the anus                            and advanced to the cecum,  identified by                            appendiceal orifice and ileocecal valve. The                            colonoscopy was performed without difficulty. The                            patient tolerated the procedure well. The quality                            of the bowel preparation was good. The ileocecal                            valve, appendiceal orifice, and rectum were                            photographed. Scope In: 10:47:56 AM Scope Out: 11:06:54 AM Scope Withdrawal Time: 0 hours 14 minutes 8 seconds  Total Procedure Duration: 0 hours 18 minutes 58 seconds  Findings:                 The digital rectal exam was normal.                           Multiple medium-mouthed diverticula were found in                            the sigmoid colon.                           The exam was otherwise without abnormality on                            direct and retroflexion views. Complications:            No immediate complications. Estimated Blood Loss:     Estimated blood loss: none. Impression:               - Mild diverticulosis in the sigmoid colon.                           -  The examination was otherwise normal on direct                            and retroflexion views.                           - No specimens collected. Recommendation:           - Patient has a contact number available for                            emergencies. The signs and symptoms of potential                            delayed complications were discussed with the                            patient. Return to normal activities tomorrow.                            Written discharge instructions were provided to the                            patient.                           - Resume previous diet.                           - Continue present medications.                           - Repeat colonoscopy in 10 years for screening                            purposes. Beverley Fiedler, MD 04/02/2023 11:09:15  AM This report has been signed electronically.

## 2023-04-02 NOTE — Progress Notes (Signed)
 Pt tearful upon awaking, denies pain or discomfort

## 2023-04-02 NOTE — Patient Instructions (Addendum)
 Continue present medications. Repeat colonoscopy in 10 years for screening purposes.   Please read over handout provided                            YOU HAD AN ENDOSCOPIC PROCEDURE TODAY AT THE [facility]:   Refer to the procedure report that was given to you for any specific questions about what was found during the examination.  If the procedure report does not answer your questions, please call your gastroenterologist to clarify.  If you requested that your care partner not be given the details of your procedure findings, then the procedure report has been included in a sealed envelope for you to review at your convenience later.  YOU SHOULD EXPECT: Some feelings of bloating in the abdomen. Passage of more gas than usual.  Walking can help get rid of the air that was put into your GI tract during the procedure and reduce the bloating. If you had a lower endoscopy (such as a colonoscopy or flexible sigmoidoscopy) you may notice spotting of blood in your stool or on the toilet paper. If you underwent a bowel prep for your procedure, you may not have a normal bowel movement for a few days.  Please Note:  You might notice some irritation and congestion in your nose or some drainage.  This is from the oxygen used during your procedure.  There is no need for concern and it should clear up in a day or so.  SYMPTOMS TO REPORT IMMEDIATELY:  Following lower endoscopy (colonoscopy or flexible sigmoidoscopy):  Excessive amounts of blood in the stool  Significant tenderness or worsening of abdominal pains  Swelling of the abdomen that is new, acute  Fever of 100F or higher   For urgent or emergent issues, a gastroenterologist can be reached at any hour by calling [phone]. Do not use MyChart messaging for urgent concerns.    DIET:  We do recommend a small meal at first, but then you may proceed to your regular diet.  Drink plenty of fluids but you should avoid alcoholic beverages  for 24 hours.  ACTIVITY:  You should plan to take it easy for the rest of today and you should NOT DRIVE or use heavy machinery until tomorrow (because of the sedation medicines used during the test).    FOLLOW UP: Our staff will call the number listed on your records the next business day following your procedure.  We will call around 7:15- 8:00 am to check on you and address any questions or concerns that you may have regarding the information given to you following your procedure. If we do not reach you, we will leave a message.      SIGNATURES/CONFIDENTIALITY: You and/or your care partner have signed paperwork which will be entered into your electronic medical record.  These signatures attest to the fact that that the information above on your After Visit Summary has been reviewed and is understood.  Full responsibility of the confidentiality of this discharge information lies with you and/or your care-partner.

## 2023-04-02 NOTE — Progress Notes (Signed)
 To pacu, VSS. Report to [email or web address]

## 2023-04-02 NOTE — Progress Notes (Signed)
 GASTROENTEROLOGY PROCEDURE H&P NOTE   Primary Care Physician: [name], MD    Reason for Procedure:  Colon cancer screening  Plan:    Colonoscopy  Patient is appropriate for endoscopic procedure(s) in the ambulatory ([facility]) setting.  The nature of the procedure, as well as the risks, benefits, and alternatives were carefully and thoroughly reviewed with the patient. Ample time for discussion and questions allowed. The patient understood, was satisfied, and agreed to proceed.     HPI: Isabel [name] is a 63 y.o. female who presents for colonoscopy.  Medical history as below.  Tolerated the prep.  No recent chest pain or shortness of breath.  No abdominal pain today.  Past Medical History:  Diagnosis Date   Breast cancer (HCC) 05/01/2022   GERD (gastroesophageal reflux disease)    HYPERGLYCEMIA    OBESITY    Pre-diabetes     Past Surgical History:  Procedure Laterality Date   AXILLARY SENTINEL NODE BIOPSY Left 05/28/2022   Procedure: LEFT AXILLARY SENTINEL NODE BIOPSY;  Surgeon: Manus Rudd, MD;  Location: [facility];  Service: General;  Laterality: Left;   BREAST BIOPSY Left 04/30/2022   MM LT BREAST BX W LOC DEV 1ST LESION IMAGE BX SPEC STEREO GUIDE 04/30/2022 [facility]   BREAST BIOPSY  05/27/2022   MM LT RADIOACTIVE SEED LOC MAMMO GUIDE 05/27/2022 [facility]   BREAST LUMPECTOMY WITH RADIOACTIVE SEED AND SENTINEL LYMPH NODE BIOPSY Left 05/28/2022   Procedure: LEFT BREAST LUMPECTOMY WITH RADIOACTIVE SEED;  Surgeon: Manus Rudd, MD;  Location: [facility];  Service: General;  Laterality: Left;   CESAREAN SECTION  1992   LAPAROSCOPIC CHOLECYSTECTOMY  2005    Prior to Admission medications   Medication Sig Start Date End Date Taking? Authorizing Provider  anastrozole (ARIMIDEX) 1 MG tablet Take 1 tablet (1 mg total) by mouth daily. 10/24/22  Yes Rachel Moulds, MD  aspirin EC 81 MG tablet Take 81 mg by mouth daily.   Yes [provider]   Calcium Carb-Cholecalciferol (CALCIUM-VITAMIN D) 500-200 MG-UNIT tablet Take 1 tablet by mouth daily.   Yes [provider]  Multiple Vitamins-Calcium (ONE-A-DAY WOMENS FORMULA) TABS Take 1 tablet by mouth daily.   Yes [provider]  Multiple Vitamins-Minerals (EMERGEN-C VITAMIN C LITE) PACK Take by mouth.   Yes [provider]  Omega-3 Fatty Acids (FISH OIL) 1000 MG CAPS Take 1,000 mg by mouth daily.   Yes [provider]  VITAJOY BIOTIN GUMMIES PO Take by mouth daily.   Yes [provider]  VITAMIN E PO Take 1 capsule by mouth daily.   Yes [provider]    Current Outpatient Medications  Medication Sig Dispense Refill   anastrozole (ARIMIDEX) 1 MG tablet Take 1 tablet (1 mg total) by mouth daily. 90 tablet 3   aspirin EC 81 MG tablet Take 81 mg by mouth daily.     Calcium Carb-Cholecalciferol (CALCIUM-VITAMIN D) 500-200 MG-UNIT tablet Take 1 tablet by mouth daily.     Multiple Vitamins-Calcium (ONE-A-DAY WOMENS FORMULA) TABS Take 1 tablet by mouth daily.     Multiple Vitamins-Minerals (EMERGEN-C VITAMIN C LITE) PACK Take by mouth.     Omega-3 Fatty Acids (FISH OIL) 1000 MG CAPS Take 1,000 mg by mouth daily.     VITAJOY BIOTIN GUMMIES PO Take by mouth daily.     VITAMIN E PO Take 1 capsule by mouth daily.     Current Facility-Administered Medications  Medication Dose Route Frequency Provider Last Rate  Last Admin   0.9 %  sodium chloride infusion  500 mL Intravenous Continuous Constantina Laseter, Carie Caddy, MD        Allergies as of 04/02/2023   (No Known Allergies)    Family History  Problem Relation Age of Onset   Prostate cancer Father    Hypertension Brother    Kidney disease Brother    Breast cancer Maternal Aunt 55   Stroke Maternal Grandmother    Colon cancer Neg Hx    Stomach cancer Neg Hx    Esophageal cancer Neg Hx    Rectal cancer Neg Hx     Social History   Socioeconomic History   Marital status: Married    Spouse  name: Not on file   Number of children: Not on file   Years of education: Not on file   Highest education level: Not on file  Occupational History   Not on file  Tobacco Use   Smoking status: Never   Smokeless tobacco: Never   Tobacco comments:    Married, 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  Vaping Use   Vaping status: Never Used  Substance and Sexual Activity   Alcohol use: Not Currently    Alcohol/week: 2.0 standard drinks of alcohol    Types: 2 Glasses of wine per week   Drug use: No   Sexual activity: Not on file  Other Topics Concern   Not on file  Social History Narrative   Not on file   Social Drivers of Health   Financial Resource Strain: Not on file  Food Insecurity: No Food Insecurity (09/12/2022)   Hunger Vital Sign    Worried About Running Out of Food in the Last Year: Never true    Ran Out of Food in the Last Year: Never true  Transportation Needs: No Transportation Needs (09/12/2022)   PRAPARE - Administrator, Civil Service (Medical): No    Lack of Transportation (Non-Medical): No  Physical Activity: Not on file  Stress: Not on file  Social Connections: Not on file  Intimate Partner Violence: Not At Risk (09/12/2022)   Humiliation, Afraid, Rape, and Kick questionnaire    Fear of Current or Ex-Partner: No    Emotionally Abused: No    Physically Abused: No    Sexually Abused: No    Physical Exam: Vital signs in last 24 hours: @BP  (!) 153/82   Pulse 81   Temp 98.2 F (36.8 C) (Skin)   Ht 5\' 6"  (1.676 m)   Wt 206 lb (93.4 kg)   LMP 11/22/2011   SpO2 100%   BMI 33.25 kg/m  GEN: NAD EYE: Sclerae anicteric ENT: MMM CV: Non-tachycardic Pulm: CTA b/l GI: Soft, NT/ND NEURO:  Alert & Oriented x 3   Erick Blinks, MD [facility]  04/02/2023 10:34 AM

## 2023-04-03 ENCOUNTER — Encounter: Payer: Self-pay | Admitting: Internal Medicine

## 2023-04-03 ENCOUNTER — Telehealth: Payer: Self-pay

## 2023-04-03 ENCOUNTER — Ambulatory Visit: Admitting: Internal Medicine

## 2023-04-03 VITALS — BP 138/80 | HR 80 | Temp 98.4°F | Ht 66.0 in | Wt 208.0 lb

## 2023-04-03 DIAGNOSIS — Z0000 Encounter for general adult medical examination without abnormal findings: Secondary | ICD-10-CM | POA: Diagnosis not present

## 2023-04-03 DIAGNOSIS — C50212 Malignant neoplasm of upper-inner quadrant of left female breast: Secondary | ICD-10-CM

## 2023-04-03 DIAGNOSIS — Z170 Estrogen receptor positive status [ER+]: Secondary | ICD-10-CM

## 2023-04-03 DIAGNOSIS — R7303 Prediabetes: Secondary | ICD-10-CM | POA: Diagnosis not present

## 2023-04-03 LAB — TSH: TSH: 1.77 u[IU]/mL (ref 0.35–5.50)

## 2023-04-03 LAB — COMPREHENSIVE METABOLIC PANEL
ALT: 24 U/L (ref 0–35)
AST: 22 U/L (ref 0–37)
Albumin: 4.4 g/dL (ref 3.5–5.2)
Alkaline Phosphatase: 63 U/L (ref 39–117)
BUN: 15 mg/dL (ref 6–23)
CO2: 27 meq/L (ref 19–32)
Calcium: 9.8 mg/dL (ref 8.4–10.5)
Chloride: 105 meq/L (ref 96–112)
Creatinine, Ser: 0.84 mg/dL (ref 0.40–1.20)
GFR: 75.04 mL/min (ref >60.00)
Glucose, Bld: 93 mg/dL (ref 70–99)
Potassium: 4.1 meq/L (ref 3.5–5.1)
Sodium: 139 meq/L (ref 135–145)
Total Bilirubin: 0.6 mg/dL (ref 0.2–1.2)
Total Protein: 7.7 g/dL (ref 6.0–8.3)

## 2023-04-03 LAB — CBC
HCT: 39.7 % (ref 36.0–46.0)
Hemoglobin: 13.1 g/dL (ref 12.0–15.0)
MCHC: 33 g/dL (ref 30.0–36.0)
MCV: 85.4 fL (ref 78.0–100.0)
Platelets: 287 10*3/uL (ref 150.0–400.0)
RBC: 4.65 Mil/uL (ref 3.87–5.11)
RDW: 14.7 % (ref 11.5–15.5)
WBC: 4.9 10*3/uL (ref 4.0–10.5)

## 2023-04-03 LAB — LIPID PANEL
Cholesterol: 142 mg/dL (ref 0–200)
HDL: 48.9 mg/dL (ref >39.00)
LDL Cholesterol: 83 mg/dL (ref 0–99)
NonHDL: 92.97
Total CHOL/HDL Ratio: 3
Triglycerides: 51 mg/dL (ref 0.0–149.0)
VLDL: 10.2 mg/dL (ref 0.0–40.0)

## 2023-04-03 LAB — HEMOGLOBIN A1C: Hgb A1c MFr Bld: 5.5 % (ref 4.6–6.5)

## 2023-04-03 NOTE — Progress Notes (Unsigned)
   Subjective:   Patient ID: Isabel [name], female    DOB: [birth year], 63 y.o.   MRN: [id number]  HPI The patient is here for physical.  PMH, [facility], social history reviewed and updated  Review of Systems  Objective:  Physical Exam  Vitals:   04/03/23 0902  BP: 138/80  Pulse: 80  Temp: 98.4 F (36.9 C)  TempSrc: Oral  SpO2: 99%  Weight: 208 lb (94.3 kg)  Height: 5\' 6"  (1.676 m)    Assessment & Plan:

## 2023-04-03 NOTE — Telephone Encounter (Signed)
  Follow up Call-     04/02/2023    9:49 AM  Call back number  Post procedure Call Back phone  # [phone]  Permission to leave phone message Yes     Patient questions:  Do you have a fever, pain , or abdominal swelling? No. Pain Score  0 *  Have you tolerated food without any problems? Yes.    Have you been able to return to your normal activities? Yes.    Do you have any questions about your discharge instructions: Diet   No. Medications  No. Follow up visit  No.  Do you have questions or concerns about your Care? No.  Actions: * If pain score is 4 or above: No action needed, pain <4.

## 2023-04-04 ENCOUNTER — Ambulatory Visit
Admission: RE | Admit: 2023-04-04 | Discharge: 2023-04-04 | Disposition: A | Discharge status: Home / Self Care | Source: Ambulatory Visit | Attending: Adult Health | Admitting: Adult Health

## 2023-04-04 ENCOUNTER — Encounter: Payer: Self-pay | Admitting: Internal Medicine

## 2023-04-04 DIAGNOSIS — C50212 Malignant neoplasm of upper-inner quadrant of left female breast: Secondary | ICD-10-CM

## 2023-04-04 HISTORY — DX: Personal history of antineoplastic chemotherapy: Z92.21

## 2023-04-04 HISTORY — DX: Personal history of irradiation: Z92.3

## 2023-04-04 NOTE — Assessment & Plan Note (Signed)
 Taking arimidex and we discussed potential side effects and benefit.

## 2023-04-04 NOTE — Assessment & Plan Note (Signed)
 Checking HgA1c and adjust as needed.

## 2023-04-04 NOTE — Assessment & Plan Note (Signed)
 Flu shot defers today. Pneumonia defers today. Shingrix complete. Tetanus up to date. Colonoscopy up to date. Mammogram up to date, pap smear up to date. Counseled about sun safety and mole surveillance. Counseled about the dangers of distracted driving. Given 10 year screening recommendations.

## 2023-06-06 ENCOUNTER — Ambulatory Visit
Admission: RE | Admit: 2023-06-06 | Discharge: 2023-06-06 | Disposition: A | Discharge status: Home / Self Care | Source: Ambulatory Visit | Attending: Hematology and Oncology | Admitting: Hematology and Oncology

## 2023-06-06 DIAGNOSIS — Z170 Estrogen receptor positive status [ER+]: Secondary | ICD-10-CM

## 2023-06-09 ENCOUNTER — Encounter: Payer: Self-pay | Admitting: Internal Medicine

## 2023-06-09 LAB — HM DIABETES EYE EXAM

## 2023-07-01 ENCOUNTER — Ambulatory Visit: Attending: Surgery

## 2023-07-01 VITALS — Wt 218.0 lb

## 2023-07-01 DIAGNOSIS — Z483 Aftercare following surgery for neoplasm: Secondary | ICD-10-CM | POA: Insufficient documentation

## 2023-07-01 NOTE — Therapy (Signed)
  OUTPATIENT PHYSICAL THERAPY SOZO SCREENING NOTE   Patient Name: Isabel [name] MRN: [id number] DOB:10-May-1960, 63 y.o., female Today's Date: 07/01/2023  PCP: Adelia Homestead, MD REFERRING PROVIDER: Adelia Homestead, *   PT End of Session - 07/01/23 [phone]     Visit Number 5   # unhchanged due to screen oly   PT Start Time 0851    PT Stop Time 0855    PT Time Calculation (min) 4 min    Activity Tolerance Patient tolerated treatment well    Behavior During Therapy [facility] for tasks assessed/performed             Past Medical History:  Diagnosis Date   Breast cancer (HCC) 05/01/2022   GERD (gastroesophageal reflux disease)    HYPERGLYCEMIA    OBESITY    Personal history of chemotherapy    Personal history of radiation therapy    Pre-diabetes    Past Surgical History:  Procedure Laterality Date   AXILLARY SENTINEL NODE BIOPSY Left 05/28/2022   Procedure: LEFT AXILLARY SENTINEL NODE BIOPSY;  Surgeon: Dareen Ebbing, MD;  Location: [facility];  Service: General;  Laterality: Left;   BREAST BIOPSY Left 04/30/2022   MM LT BREAST BX W LOC DEV 1ST LESION IMAGE BX SPEC STEREO GUIDE 04/30/2022 [facility]   BREAST BIOPSY  05/27/2022   MM LT RADIOACTIVE SEED LOC MAMMO GUIDE 05/27/2022 [facility]   BREAST LUMPECTOMY WITH RADIOACTIVE SEED AND SENTINEL LYMPH NODE BIOPSY Left 05/28/2022   Procedure: LEFT BREAST LUMPECTOMY WITH RADIOACTIVE SEED;  Surgeon: Dareen Ebbing, MD;  Location: [facility];  Service: General;  Laterality: Left;   CESAREAN SECTION  1992   LAPAROSCOPIC CHOLECYSTECTOMY  2005   Patient Active Problem List   Diagnosis Date Noted   Malignant neoplasm of upper-inner quadrant of left breast in female, estrogen receptor positive (HCC) 05/13/2022   Pre-diabetes 04/01/2022   Routine general medical examination at a health care facility 09/29/2014    REFERRING DIAG: left breast cancer at risk for lymphedema  THERAPY DIAG:  Aftercare following surgery for  neoplasm  PERTINENT HISTORY: Patient was diagnosed with left grade 2 IDC. It is located in the upper inner quadrant. It is ER pos/PR neg and HER2 neg with a Ki67 of 10%. Underwent L lumpectomy and SLNB on 05/28/22 0/2 nodes  PRECAUTIONS: left UE Lymphedema risk  SUBJECTIVE: Here for 3 month SOZO screening.   PAIN:  Are you having pain? No  SOZO SCREENING: Patient was assessed today using the SOZO machine to determine the lymphedema index score. This was compared to her baseline score. It was determined that she is within the recommended range when compared to her baseline and no further action is needed at this time. She will continue SOZO screenings. These are done every 3 months for 2 years post operatively followed by every 6 months for 2 years, and then annually.   L-DEX FLOWSHEETS - 07/01/23 0800       L-DEX LYMPHEDEMA SCREENING   Measurement Type Unilateral    L-DEX MEASUREMENT EXTREMITY Upper Extremity    POSITION  Standing    DOMINANT SIDE Right    At Risk Side Left    BASELINE SCORE (UNILATERAL) 5    L-DEX SCORE (UNILATERAL) 3.7    VALUE CHANGE (UNILAT) -1.3            P: Cont 3 month    Denyce Flank, PTA 07/01/2023, 8:55 AM

## 2023-07-07 ENCOUNTER — Telehealth: Payer: Self-pay

## 2023-07-08 ENCOUNTER — Other Ambulatory Visit: Payer: Self-pay | Admitting: *Deleted

## 2023-07-08 ENCOUNTER — Inpatient Hospital Stay

## 2023-07-08 ENCOUNTER — Inpatient Hospital Stay: Attending: Hematology and Oncology | Admitting: Hematology and Oncology

## 2023-07-08 VITALS — BP 138/67 | HR 79 | Temp 98.9°F | Resp 18 | Wt 216.3 lb

## 2023-07-08 DIAGNOSIS — Z170 Estrogen receptor positive status [ER+]: Secondary | ICD-10-CM

## 2023-07-08 DIAGNOSIS — R200 Anesthesia of skin: Secondary | ICD-10-CM | POA: Diagnosis not present

## 2023-07-08 DIAGNOSIS — Z803 Family history of malignant neoplasm of breast: Secondary | ICD-10-CM | POA: Diagnosis not present

## 2023-07-08 DIAGNOSIS — Z9221 Personal history of antineoplastic chemotherapy: Secondary | ICD-10-CM | POA: Diagnosis not present

## 2023-07-08 DIAGNOSIS — C50212 Malignant neoplasm of upper-inner quadrant of left female breast: Secondary | ICD-10-CM | POA: Insufficient documentation

## 2023-07-08 DIAGNOSIS — K219 Gastro-esophageal reflux disease without esophagitis: Secondary | ICD-10-CM | POA: Insufficient documentation

## 2023-07-08 DIAGNOSIS — Z79811 Long term (current) use of aromatase inhibitors: Secondary | ICD-10-CM | POA: Diagnosis not present

## 2023-07-08 DIAGNOSIS — Z923 Personal history of irradiation: Secondary | ICD-10-CM | POA: Insufficient documentation

## 2023-07-08 DIAGNOSIS — Z8042 Family history of malignant neoplasm of prostate: Secondary | ICD-10-CM | POA: Insufficient documentation

## 2023-07-08 NOTE — Assessment & Plan Note (Addendum)
 Assessment and Plan Assessment & Plan Breast cancer with treatment changes Breast cancer with treatment changes, including swelling and radiation changes in the inner left breast. Anastrozole  well-tolerated, no significant side effects. No chemotherapy-induced neuropathy. Regular bowel movements. Numbness in axilla and small seroma at surgical scar noted. Radiation changes expected to improve, complete resolution uncertain. New blood test for recurrence available, not 100% accurate but beneficial. - Order Guardant reveal for MRD testing today - Continue anastrozole  therapy. - Schedule follow-up appointment in six months.  Seroma at surgical scar Small seroma at surgical scar, considered harmless. No intervention needed.  Numbness in armpit Persistent axillary numbness, likely residual from treatment, expected to improve.  Baseline bone density normal Encouraged weight bearing exercises, calcium and vit D supplementation

## 2023-07-08 NOTE — Progress Notes (Signed)
 [facility] Cancer Follow up:    Isabel Homestead, MD [address] [place] [address]   DIAGNOSIS:  Cancer Staging  Malignant neoplasm of upper-inner quadrant of left breast in female, estrogen receptor positive (HCC) Staging form: Breast, AJCC 8th Edition - Pathologic stage from 05/28/2022: Stage IA (pT1b, pN0, cM0, G2, ER+, PR-, HER2-) - Signed by Percival Brace, NP on 06/27/2022 Histologic grading system: 3 grade system   SUMMARY OF ONCOLOGIC HISTORY: Oncology History  Malignant neoplasm of upper-inner quadrant of left breast in female, estrogen receptor positive (HCC)  04/02/2022 Mammogram   In the left breast a possible asymmetry warrants further evaluation.  In the right breast, no findings suspicious for malignancy.  Further evaluation is suggested for possible asymmetry in the left breast.  Ultrasound breast showed indeterminate left breast focal asymmetry with possible distortion.    Pathology Results   Left breast needle core biopsy showed invasive ductal carcinoma overall grade 2, DCIS intermediate to high-grade, prognostic showed ER 100% positive strong staining PR 0% negative, Ki-67 of 10% and group 5 HER2 negative   05/28/2022 Definitive Surgery   Left lumpectomy showed no evidence of residual malignancy, 2 axillary lymph nodes negative for carcinoma.  Original biopsy showed 7 mm invasive ductal carcinoma.   05/28/2022 Cancer Staging   Staging form: Breast, AJCC 8th Edition - Pathologic stage from 05/28/2022: Stage IA (pT1b, pN0, cM0, G2, ER+, PR-, HER2-) - Signed by Percival Brace, NP on 06/27/2022 Histologic grading system: 3 grade system   06/06/2022 Oncotype testing   36/24%, >15% absolute chemotherapy benefit   06/27/2022 - 08/30/2022 Chemotherapy   Patient is on Treatment Plan : BREAST TC q21d     09/25/2022 - 10/23/2022 Radiation Therapy   Plan Name: Breast_L_BH Site: Breast, Left Technique: 3D Mode: Photon Dose Per  Fraction: 2.66 Gy Prescribed Dose (Delivered / Prescribed): 42.56 Gy / 42.56 Gy Prescribed Fxs (Delivered / Prescribed): 16 / 16   Plan Name: Brst_L_Bst_BH Site: Breast, Left Technique: 3D Mode: Photon Dose Per Fraction: 2 Gy Prescribed Dose (Delivered / Prescribed): 8 Gy / 8 Gy Prescribed Fxs (Delivered / Prescribed): 4 / 4   10/2022 -  Anti-estrogen oral therapy   Anastrozole      CURRENT THERAPY: anastrozole .  INTERVAL HISTORY:  Isabel [name] 63 y.o. female returns for follow-up and evaluation while on anastrozole .  Discussed the use of AI scribe software for clinical note transcription with the patient, who gave verbal consent to proceed.  History of Present Illness Isabel [name] Isabel [name] is a 63 year old female with breast cancer who presents for follow-up regarding her treatment with anastrozole .  She takes anastrozole  every morning around 7:00 AM, occasionally later on weekends, and feels generally well. She experiences some aches and pains but does not find them limiting. She maintains an active lifestyle, walking and exercising regularly, although she occasionally misses days.  She is conscious of her diet, trying to reduce sugar intake and opting for healthier alternatives like protein bars with almonds and dark chocolate. She also consumes a plant-based protein shake with bananas, peanut butter, and blueberries in the mornings, which she finds satisfying and filling.  She continues to experience some swelling and numbness in her breast and armpit area following chemotherapy and radiation treatment. No neuropathy from chemotherapy. She has regular bowel movements every morning.  She recently had a mammogram and bone density test, both of which were normal. She also underwent a colonoscopy with good results.  Rest of the pertinent 10 point ROS reviewed and neg.   Patient Active Problem List   Diagnosis Date Noted   Malignant neoplasm of upper-inner quadrant of left breast  in female, estrogen receptor positive (HCC) 05/13/2022   Pre-diabetes 04/01/2022   Routine general medical examination at a health care facility 09/29/2014    has no known allergies.  MEDICAL HISTORY: Past Medical History:  Diagnosis Date   Breast cancer (HCC) 05/01/2022   GERD (gastroesophageal reflux disease)    HYPERGLYCEMIA    OBESITY    Personal history of chemotherapy    Personal history of radiation therapy    Pre-diabetes     SURGICAL HISTORY: Past Surgical History:  Procedure Laterality Date   AXILLARY SENTINEL NODE BIOPSY Left 05/28/2022   Procedure: LEFT AXILLARY SENTINEL NODE BIOPSY;  Surgeon: Dareen Ebbing, MD;  [facility]: [facility];  Service: General;  Laterality: Left;   BREAST BIOPSY Left 04/30/2022   MM LT BREAST BX W LOC DEV 1ST LESION IMAGE BX SPEC STEREO GUIDE 04/30/2022 [facility]   BREAST BIOPSY  05/27/2022   MM LT RADIOACTIVE SEED LOC MAMMO GUIDE 05/27/2022 [facility]   BREAST LUMPECTOMY WITH RADIOACTIVE SEED AND SENTINEL LYMPH NODE BIOPSY Left 05/28/2022   Procedure: LEFT BREAST LUMPECTOMY WITH RADIOACTIVE SEED;  Surgeon: Dareen Ebbing, MD;  Location: [facility];  Service: General;  Laterality: Left;   CESAREAN SECTION  1992   LAPAROSCOPIC CHOLECYSTECTOMY  2005    SOCIAL HISTORY: Social History   Socioeconomic History   Marital status: Married    Spouse name: Not on file   Number of children: Not on file   Years of education: Not on file   Highest education level: Not on file  Occupational History   Not on file  Tobacco Use   Smoking status: Never   Smokeless tobacco: Never   Tobacco comments:    Married, 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  Vaping Use   Vaping status: Never Used  Substance and Sexual Activity   Alcohol use: Not Currently    Alcohol/week: 2.0 standard drinks of alcohol    Types: 2 Glasses of wine per week   Drug use: No   Sexual activity: Not on file  Other Topics Concern   Not on file   Social History Narrative   Not on file   Social Drivers of Health   Financial Resource Strain: Not on file  Food Insecurity: No Food Insecurity (09/12/2022)   Hunger Vital Sign    Worried About Running Out of Food in the Last Year: Never true    Ran Out of Food in the Last Year: Never true  Transportation Needs: No Transportation Needs (09/12/2022)   PRAPARE - Administrator, Civil Service (Medical): No    Lack of Transportation (Non-Medical): No  Physical Activity: Not on file  Stress: Not on file  Social Connections: Not on file  Intimate Partner Violence: Not At Risk (09/12/2022)   Humiliation, Afraid, Rape, and Kick questionnaire    Fear of Current or Ex-Partner: No    Emotionally Abused: No    Physically Abused: No    Sexually Abused: No    FAMILY HISTORY: Family History  Problem Relation Age of Onset   Prostate cancer Father    Hypertension Brother    Kidney disease Brother    Breast cancer Maternal Aunt 55   Stroke Maternal Grandmother    Colon cancer Neg Hx    Stomach cancer Neg  Hx    Esophageal cancer Neg Hx    Rectal cancer Neg Hx     Review of Systems  Constitutional:  Positive for fatigue. Negative for appetite change, chills, fever and unexpected weight change.  HENT:   Negative for hearing loss, lump/mass and trouble swallowing.   Eyes:  Negative for eye problems and icterus.  Respiratory:  Negative for chest tightness, cough and shortness of breath.   Cardiovascular:  Negative for chest pain, leg swelling and palpitations.  Gastrointestinal:  Negative for abdominal distention, abdominal pain, constipation, diarrhea, nausea and vomiting.  Endocrine: Negative for hot flashes.  Genitourinary:  Negative for difficulty urinating.   Musculoskeletal:  Negative for arthralgias.  Skin:  Negative for itching and rash.  Neurological:  Negative for dizziness, extremity weakness, headaches and numbness.  Hematological:  Negative for adenopathy. Does not  bruise/bleed easily.  Psychiatric/Behavioral:  Negative for depression. The patient is not nervous/anxious.       PHYSICAL EXAMINATION     Vitals:   07/08/23 1214  BP: 138/67  Pulse: 79  Resp: 18  Temp: 98.9 F (37.2 C)  SpO2: 100%   General Appearance: Alert, oriented and in no acute distress Neck: No cervical lymphadenopathy Breast: Bilateral breast inspected and palpated, seroma noted in the left breast at the surgical site and postop/postradiation changes.  No concerns for local recurrence. No regional adenopathy   LABORATORY DATA:  CBC    Component Value Date/Time   WBC 4.9 04/03/2023 0940   RBC 4.65 04/03/2023 0940   HGB 13.1 04/03/2023 0940   HGB 12.7 10/24/2022 0800   HCT 39.7 04/03/2023 0940   PLT 287.0 04/03/2023 0940   PLT 244 10/24/2022 0800   MCV 85.4 04/03/2023 0940   MCH 28.0 10/24/2022 0800   MCHC 33.0 04/03/2023 0940   RDW 14.7 04/03/2023 0940   LYMPHSABS 0.7 10/24/2022 0800   MONOABS 0.5 10/24/2022 0800   EOSABS 0.1 10/24/2022 0800   BASOSABS 0.0 10/24/2022 0800    CMP     Component Value Date/Time   NA 139 04/03/2023 0940   K 4.1 04/03/2023 0940   CL 105 04/03/2023 0940   CO2 27 04/03/2023 0940   GLUCOSE 93 04/03/2023 0940   BUN 15 04/03/2023 0940   CREATININE 0.84 04/03/2023 0940   CREATININE 0.80 10/24/2022 0800   CALCIUM 9.8 04/03/2023 0940   PROT 7.7 04/03/2023 0940   ALBUMIN 4.4 04/03/2023 0940   AST 22 04/03/2023 0940   AST 19 10/24/2022 0800   ALT 24 04/03/2023 0940   ALT 17 10/24/2022 0800   ALKPHOS 63 04/03/2023 0940   BILITOT 0.6 04/03/2023 0940   BILITOT 0.4 10/24/2022 0800   GFRNONAA >60 10/24/2022 0800     ASSESSMENT and THERAPY PLAN:   Malignant neoplasm of upper-inner quadrant of left breast in female, estrogen receptor positive (HCC) Assessment and Plan Assessment & Plan Breast cancer with treatment changes Breast cancer with treatment changes, including swelling and radiation changes in the inner left  breast. Anastrozole  well-tolerated, no significant side effects. No chemotherapy-induced neuropathy. Regular bowel movements. Numbness in axilla and small seroma at surgical scar noted. Radiation changes expected to improve, complete resolution uncertain. New blood test for recurrence available, not 100% accurate but beneficial. - Order Guardant reveal for MRD testing today - Continue anastrozole  therapy. - Schedule follow-up appointment in six months.  Seroma at surgical scar Small seroma at surgical scar, considered harmless. No intervention needed.  Numbness in armpit Persistent axillary numbness, likely residual from  treatment, expected to improve.  Baseline bone density normal Encouraged weight bearing exercises, calcium and vit D supplementation     All questions were answered. The patient knows to call the clinic with any problems, questions or concerns. We can certainly see the patient much sooner if necessary.  Total encounter time:30 minutes*in face-to-face visit time, chart review, lab review, care coordination, order entry, and documentation of the encounter time.   *Total Encounter Time as defined by the Centers for Medicare and Medicaid Services includes, in addition to the face-to-face time of a patient visit (documented in the note above) non-face-to-face time: obtaining and reviewing outside history, ordering and reviewing medications, tests or procedures, care coordination (communications with other health care professionals or caregivers) and documentation in the medical record.

## 2023-07-22 ENCOUNTER — Encounter: Payer: Self-pay | Admitting: Hematology and Oncology

## 2023-07-22 ENCOUNTER — Ambulatory Visit: Admitting: Hematology and Oncology

## 2023-07-22 LAB — GUARDANT REVEAL

## 2023-07-25 ENCOUNTER — Ambulatory Visit: Payer: Self-pay | Admitting: Hematology and Oncology

## 2023-07-25 NOTE — Telephone Encounter (Signed)
 Called pt per MD to advise Guardant Reveal testing was negative/not detected. Pt verbalized understanding of results. She requests to have future Guardant testing done at [facility] lab as she feels more comfortable coming to the office for labs, stating, I just dont feel comfortable with them coming into my house.  Explained to pt that if labs are done at [facility], she may be subject to facility charge for the lab, which Guardant does not cover. Advised pt I would make oncoming RN aware so orders can be changed for pt to come in office for future draws. She verbalized thanks and understanding.

## 2023-07-25 NOTE — Telephone Encounter (Signed)
-----   Message from Allison Iruku sent at 07/25/2023  8:31 AM EDT ----- Beauty Bourbon reveal neg, please let her know. ----- Message ----- From: Dannis Dy, Lab In [place] Sent: 07/22/2023  12:27 PM EDT To: Murleen Arms, MD

## 2023-07-28 NOTE — Telephone Encounter (Signed)
 Called to confirm appt for 6/04

## 2023-09-29 ENCOUNTER — Ambulatory Visit: Attending: Surgery

## 2023-09-29 VITALS — Wt 222.0 lb

## 2023-09-29 DIAGNOSIS — Z483 Aftercare following surgery for neoplasm: Secondary | ICD-10-CM | POA: Insufficient documentation

## 2023-09-29 NOTE — Therapy (Signed)
  OUTPATIENT PHYSICAL THERAPY SOZO SCREENING NOTE   Patient Name: Isabel [name] MRN: [id number] DOB:[birth year], 63 y.o., female Today's Date: 09/29/2023  PCP: Rollene Almarie LABOR, MD REFERRING PROVIDER: Belinda Cough, MD   PT End of Session - 09/29/23 [phone]     Visit Number 5   # unchanged due to screen only   PT Start Time 0904    PT Stop Time 0908    PT Time Calculation (min) 4 min    Activity Tolerance Patient tolerated treatment well    Behavior During Therapy [facility] for tasks assessed/performed          Past Medical History:  Diagnosis Date   Breast cancer (HCC) 05/01/2022   GERD (gastroesophageal reflux disease)    HYPERGLYCEMIA    OBESITY    Personal history of chemotherapy    Personal history of radiation therapy    Pre-diabetes    Past Surgical History:  Procedure Laterality Date   AXILLARY SENTINEL NODE BIOPSY Left 05/28/2022   Procedure: LEFT AXILLARY SENTINEL NODE BIOPSY;  Surgeon: Belinda Cough, MD;  Location: [facility];  Service: General;  Laterality: Left;   BREAST BIOPSY Left 04/30/2022   MM LT BREAST BX W LOC DEV 1ST LESION IMAGE BX SPEC STEREO GUIDE 04/30/2022 [facility]   BREAST BIOPSY  05/27/2022   MM LT RADIOACTIVE SEED LOC MAMMO GUIDE 05/27/2022 [facility]   BREAST LUMPECTOMY WITH RADIOACTIVE SEED AND SENTINEL LYMPH NODE BIOPSY Left 05/28/2022   Procedure: LEFT BREAST LUMPECTOMY WITH RADIOACTIVE SEED;  Surgeon: Belinda Cough, MD;  Location: [facility];  Service: General;  Laterality: Left;   CESAREAN SECTION  1992   LAPAROSCOPIC CHOLECYSTECTOMY  2005   Patient Active Problem List   Diagnosis Date Noted   Malignant neoplasm of upper-inner quadrant of left breast in female, estrogen receptor positive (HCC) 05/13/2022   Pre-diabetes 04/01/2022   Routine general medical examination at a health care facility 09/29/2014    REFERRING DIAG: left breast cancer at risk for lymphedema  THERAPY DIAG: Aftercare following surgery for  neoplasm  PERTINENT HISTORY: Patient was diagnosed with left grade 2 IDC. It is located in the upper inner quadrant. It is ER pos/PR neg and HER2 neg with a Ki67 of 10%. Underwent L lumpectomy and SLNB on 05/28/22 0/2 nodes  PRECAUTIONS: left UE Lymphedema risk  SUBJECTIVE: Here for 3 month SOZO screening.   PAIN:  Are you having pain? No  SOZO SCREENING: Patient was assessed today using the SOZO machine to determine the lymphedema index score. This was compared to her baseline score. It was determined that she is within the recommended range when compared to her baseline and no further action is needed at this time. She will continue SOZO screenings. These are done every 3 months for 2 years post operatively followed by every 6 months for 2 years, and then annually.   L-DEX FLOWSHEETS - 09/29/23 0900       L-DEX LYMPHEDEMA SCREENING   Measurement Type Unilateral    L-DEX MEASUREMENT EXTREMITY Upper Extremity    POSITION  Standing    DOMINANT SIDE Right    At Risk Side Left    BASELINE SCORE (UNILATERAL) 5    L-DEX SCORE (UNILATERAL) 5.6    VALUE CHANGE (UNILAT) 0.6         P: Cont 3 month    Aden Berwyn Caldron, PTA 09/29/2023, 9:07 AM

## 2023-10-23 ENCOUNTER — Other Ambulatory Visit: Payer: Self-pay

## 2023-10-23 ENCOUNTER — Telehealth: Payer: Self-pay

## 2023-10-23 MED ORDER — ANASTROZOLE 1 MG PO TABS: 1.0000 mg | ORAL_TABLET | Freq: Every day | ORAL | 3 refills | Status: AC

## 2023-10-23 NOTE — Telephone Encounter (Signed)
 Pt called to request refill for Anastrozole . She is scheduled for f/u 01/2024. Per MD, refill sent. Pt is aware and verbalized thanks. She has no other questions/concerns at this time.

## 2023-12-12 ENCOUNTER — Encounter: Payer: Self-pay | Admitting: *Deleted

## 2023-12-12 NOTE — Progress Notes (Signed)
 Guardant Reveal renewal order placed via the portal.

## 2023-12-29 ENCOUNTER — Ambulatory Visit: Attending: Surgery

## 2023-12-29 VITALS — Wt 224.0 lb

## 2023-12-29 DIAGNOSIS — Z483 Aftercare following surgery for neoplasm: Secondary | ICD-10-CM | POA: Insufficient documentation

## 2023-12-29 NOTE — Therapy (Signed)
  OUTPATIENT PHYSICAL THERAPY SOZO SCREENING NOTE   Patient Name: Isabel [name] MRN: [id number] DOB:14-Oct-1960, 63 y.o., female Today's Date: 12/29/2023  PCP: Rollene Almarie LABOR, MD REFERRING PROVIDER: Belinda Cough, MD   PT End of Session - 12/29/23 [phone]     Visit Number 5   # unchanged due to screen only   PT Start Time 0903    PT Stop Time 0907    PT Time Calculation (min) 4 min    Activity Tolerance Patient tolerated treatment well    Behavior During Therapy [facility] for tasks assessed/performed          Past Medical History:  Diagnosis Date   Breast cancer (HCC) 05/01/2022   GERD (gastroesophageal reflux disease)    HYPERGLYCEMIA    OBESITY    Personal history of chemotherapy    Personal history of radiation therapy    Pre-diabetes    Past Surgical History:  Procedure Laterality Date   AXILLARY SENTINEL NODE BIOPSY Left 05/28/2022   Procedure: LEFT AXILLARY SENTINEL NODE BIOPSY;  Surgeon: Belinda Cough, MD;  [facility];  Service: General;  Laterality: Left;   BREAST BIOPSY Left 04/30/2022   MM LT BREAST BX W LOC DEV 1ST LESION IMAGE BX SPEC STEREO GUIDE 04/30/2022 [facility]   BREAST BIOPSY  05/27/2022   MM LT RADIOACTIVE SEED LOC MAMMO GUIDE 05/27/2022 [facility]   BREAST LUMPECTOMY WITH RADIOACTIVE SEED AND SENTINEL LYMPH NODE BIOPSY Left 05/28/2022   Procedure: LEFT BREAST LUMPECTOMY WITH RADIOACTIVE SEED;  Surgeon: Belinda Cough, MD;  [facility]: [facility];  Service: General;  Laterality: Left;   CESAREAN SECTION  1992   LAPAROSCOPIC CHOLECYSTECTOMY  2005   Patient Active Problem List   Diagnosis Date Noted   Malignant neoplasm of upper-inner quadrant of left breast in female, estrogen receptor positive (HCC) 05/13/2022   Pre-diabetes 04/01/2022   Routine general medical examination at a health care facility 09/29/2014    REFERRING DIAG: left breast cancer at risk for lymphedema  THERAPY DIAG: Aftercare following surgery for  neoplasm  PERTINENT HISTORY: Patient was diagnosed with left grade 2 IDC. It is located in the upper inner quadrant. It is ER pos/PR neg and HER2 neg with a Ki67 of 10%. Underwent L lumpectomy and SLNB on 05/28/22 0/2 nodes  PRECAUTIONS: left UE Lymphedema risk  SUBJECTIVE: Here for 3 month SOZO screening.   PAIN:  Are you having pain? No  SOZO SCREENING: Patient was assessed today using the SOZO machine to determine the lymphedema index score. This was compared to her baseline score. It was determined that she is within the recommended range when compared to her baseline and no further action is needed at this time. She will continue SOZO screenings. These are done every 3 months for 2 years post operatively followed by every 6 months for 2 years, and then annually.   L-DEX FLOWSHEETS - 12/29/23 0900       L-DEX LYMPHEDEMA SCREENING   Measurement Type Unilateral    L-DEX MEASUREMENT EXTREMITY Upper Extremity    POSITION  Standing    DOMINANT SIDE Right    At Risk Side Left    BASELINE SCORE (UNILATERAL) 5    L-DEX SCORE (UNILATERAL) 0.5    VALUE CHANGE (UNILAT) -4.5         P: Cont one more 3 month L-Dex and then transition to 6 months until 05/2026.    Aden Berwyn Caldron, PTA 12/29/2023, 9:08 AM

## 2024-01-09 ENCOUNTER — Inpatient Hospital Stay

## 2024-01-09 ENCOUNTER — Inpatient Hospital Stay: Attending: Hematology and Oncology | Admitting: Hematology and Oncology

## 2024-01-09 VITALS — BP 120/76 | HR 92 | Temp 96.4°F | Resp 18 | Wt 223.7 lb

## 2024-01-09 DIAGNOSIS — Z170 Estrogen receptor positive status [ER+]: Secondary | ICD-10-CM | POA: Diagnosis not present

## 2024-01-09 DIAGNOSIS — C50212 Malignant neoplasm of upper-inner quadrant of left female breast: Secondary | ICD-10-CM | POA: Insufficient documentation

## 2024-01-09 DIAGNOSIS — R739 Hyperglycemia, unspecified: Secondary | ICD-10-CM | POA: Insufficient documentation

## 2024-01-09 DIAGNOSIS — Z8042 Family history of malignant neoplasm of prostate: Secondary | ICD-10-CM | POA: Insufficient documentation

## 2024-01-09 DIAGNOSIS — Z79811 Long term (current) use of aromatase inhibitors: Secondary | ICD-10-CM | POA: Diagnosis not present

## 2024-01-09 DIAGNOSIS — Z803 Family history of malignant neoplasm of breast: Secondary | ICD-10-CM | POA: Diagnosis not present

## 2024-01-09 DIAGNOSIS — Z923 Personal history of irradiation: Secondary | ICD-10-CM | POA: Diagnosis not present

## 2024-01-09 DIAGNOSIS — Z9221 Personal history of antineoplastic chemotherapy: Secondary | ICD-10-CM | POA: Insufficient documentation

## 2024-01-09 DIAGNOSIS — Z8051 Family history of malignant neoplasm of kidney: Secondary | ICD-10-CM | POA: Insufficient documentation

## 2024-01-09 DIAGNOSIS — Z1722 Progesterone receptor negative status: Secondary | ICD-10-CM | POA: Diagnosis not present

## 2024-01-09 DIAGNOSIS — Z1732 Human epidermal growth factor receptor 2 negative status: Secondary | ICD-10-CM | POA: Insufficient documentation

## 2024-01-09 DIAGNOSIS — K219 Gastro-esophageal reflux disease without esophagitis: Secondary | ICD-10-CM | POA: Diagnosis not present

## 2024-01-09 NOTE — Progress Notes (Signed)
 [facility] Cancer Follow up:    Rollene Almarie LABOR, MD [address] [place] [address]   DIAGNOSIS:  Cancer Staging  Malignant neoplasm of upper-inner quadrant of left breast in female, estrogen receptor positive (HCC) Staging form: Breast, AJCC 8th Edition - Pathologic stage from 05/28/2022: Stage IA (pT1b, pN0, cM0, G2, ER+, PR-, HER2-) - Signed by Crawford Morna Pickle, NP on 06/27/2022 Histologic grading system: 3 grade system   SUMMARY OF ONCOLOGIC HISTORY: Oncology History  Malignant neoplasm of upper-inner quadrant of left breast in female, estrogen receptor positive (HCC)  04/02/2022 Mammogram   In the left breast a possible asymmetry warrants further evaluation.  In the right breast, no findings suspicious for malignancy.  Further evaluation is suggested for possible asymmetry in the left breast.  Ultrasound breast showed indeterminate left breast focal asymmetry with possible distortion.    Pathology Results   Left breast needle core biopsy showed invasive ductal carcinoma overall grade 2, DCIS intermediate to high-grade, prognostic showed ER 100% positive strong staining PR 0% negative, Ki-67 of 10% and group 5 HER2 negative   05/28/2022 Definitive Surgery   Left lumpectomy showed no evidence of residual malignancy, 2 axillary lymph nodes negative for carcinoma.  Original biopsy showed 7 mm invasive ductal carcinoma.   05/28/2022 Cancer Staging   Staging form: Breast, AJCC 8th Edition - Pathologic stage from 05/28/2022: Stage IA (pT1b, pN0, cM0, G2, ER+, PR-, HER2-) - Signed by Crawford Morna Pickle, NP on 06/27/2022 Histologic grading system: 3 grade system   06/06/2022 Oncotype testing   36/24%, >15% absolute chemotherapy benefit   06/27/2022 - 08/30/2022 Chemotherapy   Patient is on Treatment Plan : BREAST TC q21d     09/25/2022 - 10/23/2022 Radiation Therapy   Plan Name: Breast_L_BH Site: Breast, Left Technique: 3D Mode: Photon Dose Per  Fraction: 2.66 Gy Prescribed Dose (Delivered / Prescribed): 42.56 Gy / 42.56 Gy Prescribed Fxs (Delivered / Prescribed): 16 / 16   Plan Name: Brst_L_Bst_BH Site: Breast, Left Technique: 3D Mode: Photon Dose Per Fraction: 2 Gy Prescribed Dose (Delivered / Prescribed): 8 Gy / 8 Gy Prescribed Fxs (Delivered / Prescribed): 4 / 4   10/2022 -  Anti-estrogen oral therapy   Anastrozole      CURRENT THERAPY: anastrozole .  INTERVAL HISTORY:  Isabel [name] 63 y.o. female returns for follow-up and evaluation while on anastrozole .  History of Present Illness  Isabel [name] is a 63 year old female with breast cancer who presents for routine follow-up.  She is currently on anastrozole , which she takes at 7:00 AM with a bottle of water. The medication is well-tolerated without causing any adverse effects. Her mammogram from February was favorable. A blood test to detect breast cancer was negative in June, and she is due for another test now.  She is trying to maintain a healthy lifestyle by eating right and exercising regularly. Despite her efforts, she finds it challenging to lose weight and often craves chips after meals. She wants to balance her diet with lean protein and has tried chicken bone broth soup but did not notice any significant changes.  Rest of the pertinent 10 point ROS reviewed and neg.   Patient Active Problem List   Diagnosis Date Noted   Malignant neoplasm of upper-inner quadrant of left breast in female, estrogen receptor positive (HCC) 05/13/2022   Pre-diabetes 04/01/2022   Routine general medical examination at a health care facility 09/29/2014    has no known allergies.  MEDICAL  HISTORY: Past Medical History:  Diagnosis Date   Breast cancer (HCC) 05/01/2022   GERD (gastroesophageal reflux disease)    HYPERGLYCEMIA    OBESITY    Personal history of chemotherapy    Personal history of radiation therapy    Pre-diabetes     SURGICAL HISTORY: Past  Surgical History:  Procedure Laterality Date   AXILLARY SENTINEL NODE BIOPSY Left 05/28/2022   Procedure: LEFT AXILLARY SENTINEL NODE BIOPSY;  Surgeon: Belinda Cough, MD;  [facility]: [facility];  Service: General;  Laterality: Left;   BREAST BIOPSY Left 04/30/2022   MM LT BREAST BX W LOC DEV 1ST LESION IMAGE BX SPEC STEREO GUIDE 04/30/2022 [facility]   BREAST BIOPSY  05/27/2022   MM LT RADIOACTIVE SEED LOC MAMMO GUIDE 05/27/2022 [facility]   BREAST LUMPECTOMY WITH RADIOACTIVE SEED AND SENTINEL LYMPH NODE BIOPSY Left 05/28/2022   Procedure: LEFT BREAST LUMPECTOMY WITH RADIOACTIVE SEED;  Surgeon: Belinda Cough, MD;  Location: [facility];  Service: General;  Laterality: Left;   CESAREAN SECTION  1992   LAPAROSCOPIC CHOLECYSTECTOMY  2005    SOCIAL HISTORY: Social History   Socioeconomic History   Marital status: Married    Spouse name: Not on file   Number of children: Not on file   Years of education: Not on file   Highest education level: Not on file  Occupational History   Not on file  Tobacco Use   Smoking status: Never   Smokeless tobacco: Never   Tobacco comments:    Married, 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  Vaping Use   Vaping status: Never Used  Substance and Sexual Activity   Alcohol use: Not Currently    Alcohol/week: 2.0 standard drinks of alcohol    Types: 2 Glasses of wine per week   Drug use: No   Sexual activity: Not on file  Other Topics Concern   Not on file  Social History Narrative   Not on file   Social Drivers of Health   Financial Resource Strain: Not on file  Food Insecurity: No Food Insecurity (09/12/2022)   Hunger Vital Sign    Worried About Running Out of Food in the Last Year: Never true    Ran Out of Food in the Last Year: Never true  Transportation Needs: No Transportation Needs (09/12/2022)   PRAPARE - Administrator, Civil Service (Medical): No    Lack of Transportation (Non-Medical): No  Physical  Activity: Not on file  Stress: Not on file  Social Connections: Not on file  Intimate Partner Violence: Not At Risk (09/12/2022)   Humiliation, Afraid, Rape, and Kick questionnaire    Fear of Current or Ex-Partner: No    Emotionally Abused: No    Physically Abused: No    Sexually Abused: No    FAMILY HISTORY: Family History  Problem Relation Age of Onset   Prostate cancer Father    Hypertension Brother    Kidney disease Brother    Breast cancer Maternal Aunt 55   Stroke Maternal Grandmother    Colon cancer Neg Hx    Stomach cancer Neg Hx    Esophageal cancer Neg Hx    Rectal cancer Neg Hx         PHYSICAL EXAMINATION   There were no vitals filed for this visit.  General Appearance: Alert, oriented and in no acute distress Neck: No cervical lymphadenopathy Breast: Bilateral breast inspected and palpated, seroma noted in the left breast at the  surgical site and postop/postradiation changes.  No concerns for local recurrence. No regional adenopathy   LABORATORY DATA:  CBC    Component Value Date/Time   WBC 4.9 04/03/2023 0940   RBC 4.65 04/03/2023 0940   HGB 13.1 04/03/2023 0940   HGB 12.7 10/24/2022 0800   HCT 39.7 04/03/2023 0940   PLT 287.0 04/03/2023 0940   PLT 244 10/24/2022 0800   MCV 85.4 04/03/2023 0940   MCH 28.0 10/24/2022 0800   MCHC 33.0 04/03/2023 0940   RDW 14.7 04/03/2023 0940   LYMPHSABS 0.7 10/24/2022 0800   MONOABS 0.5 10/24/2022 0800   EOSABS 0.1 10/24/2022 0800   BASOSABS 0.0 10/24/2022 0800    CMP     Component Value Date/Time   NA 139 04/03/2023 0940   K 4.1 04/03/2023 0940   CL 105 04/03/2023 0940   CO2 27 04/03/2023 0940   GLUCOSE 93 04/03/2023 0940   BUN 15 04/03/2023 0940   CREATININE 0.84 04/03/2023 0940   CREATININE 0.80 10/24/2022 0800   CALCIUM 9.8 04/03/2023 0940   PROT 7.7 04/03/2023 0940   ALBUMIN 4.4 04/03/2023 0940   AST 22 04/03/2023 0940   AST 19 10/24/2022 0800   ALT 24 04/03/2023 0940   ALT 17 10/24/2022  0800   ALKPHOS 63 04/03/2023 0940   BILITOT 0.6 04/03/2023 0940   BILITOT 0.4 10/24/2022 0800   GFRNONAA >60 10/24/2022 0800     ASSESSMENT and THERAPY PLAN:  Assessment & Plan Malignant neoplasm of upper-inner quadrant of left breast Currently on breast cancer medication with no adverse effects.  No concerns on breast exam. Recent mammogram satisfactory. Guardant reveal negative in June. - Continue current breast cancer medication regimen. - Schedule follow-up mammogram in February. - Schedule follow-up appointment in six months.  All questions were answered. The patient knows to call the clinic with any problems, questions or concerns. We can certainly see the patient much sooner if necessary.  Total encounter time:20 minutes*in face-to-face visit time, chart review, lab review, care coordination, order entry, and documentation of the encounter time.   *Total Encounter Time as defined by the Centers for Medicare and Medicaid Services includes, in addition to the face-to-face time of a patient visit (documented in the note above) non-face-to-face time: obtaining and reviewing outside history, ordering and reviewing medications, tests or procedures, care coordination (communications with other health care professionals or caregivers) and documentation in the medical record.

## 2024-01-09 NOTE — Assessment & Plan Note (Signed)
 Assessment and Plan Assessment & Plan

## 2024-01-15 ENCOUNTER — Telehealth: Payer: Self-pay | Admitting: *Deleted

## 2024-01-15 NOTE — Telephone Encounter (Signed)
 Called pt to make aware that Guardant Reveal results were negative and advised to f/u as scheduled. Pt verbalized understanding.

## 2024-01-16 ENCOUNTER — Encounter: Payer: Self-pay | Admitting: Hematology and Oncology

## 2024-01-19 LAB — GUARDANT REVEAL

## 2024-02-02 ENCOUNTER — Other Ambulatory Visit: Payer: Self-pay | Admitting: Adult Health

## 2024-02-02 DIAGNOSIS — Z853 Personal history of malignant neoplasm of breast: Secondary | ICD-10-CM

## 2024-03-05 ENCOUNTER — Ambulatory Visit (HOSPITAL_COMMUNITY)
Admission: EM | Admit: 2024-03-05 | Discharge: 2024-03-05 | Disposition: A | Discharge status: Home / Self Care | Attending: Family Medicine | Admitting: Family Medicine

## 2024-03-05 ENCOUNTER — Ambulatory Visit (HOSPITAL_COMMUNITY)

## 2024-03-05 ENCOUNTER — Other Ambulatory Visit: Payer: Self-pay

## 2024-03-05 ENCOUNTER — Encounter (HOSPITAL_COMMUNITY): Payer: Self-pay | Admitting: Emergency Medicine

## 2024-03-05 DIAGNOSIS — R052 Subacute cough: Secondary | ICD-10-CM

## 2024-03-05 DIAGNOSIS — J069 Acute upper respiratory infection, unspecified: Secondary | ICD-10-CM

## 2024-03-05 LAB — POC SOFIA SARS ANTIGEN FIA: SARS Coronavirus 2 Ag: NEGATIVE

## 2024-03-05 MED ORDER — BENZONATATE 100 MG PO CAPS: 100.0000 mg | ORAL_CAPSULE | Freq: Three times a day (TID) | ORAL | 0 refills | Status: AC | PRN

## 2024-03-05 NOTE — ED Provider Notes (Signed)
 " [facility]    CSN: [id number] Arrival date & time: 03/05/24  1340      History   Chief Complaint Chief Complaint  Patient presents with   Cough    HPI Isabel [name] is a 64 y.o. female.    Cough Here for increased cough nasal congestion and rhinorrhea that started yesterday.  No fever and no nausea or vomiting or diarrhea with this illness.  No shortness of breath and no chills.  No malaise.  Around Thanksgiving of 2025 she had upper respiratory symptoms with a lot of cough and did feel bad during that time.  It took a couple weeks to improve and even what she was mostly improved she still had a lingering cough.  NKDA  Past medical history is significant for invasive breast cancer with negative sentinel node biopsy in April 2024.  She is currently on anastrozole .  Past Medical History:  Diagnosis Date   Breast cancer (HCC) 05/01/2022   GERD (gastroesophageal reflux disease)    HYPERGLYCEMIA    OBESITY    Personal history of chemotherapy    Personal history of radiation therapy    Pre-diabetes     Patient Active Problem List   Diagnosis Date Noted   Malignant neoplasm of upper-inner quadrant of left breast in female, estrogen receptor positive (HCC) 05/13/2022   Pre-diabetes 04/01/2022   Routine general medical examination at a health care facility 09/29/2014    Past Surgical History:  Procedure Laterality Date   AXILLARY SENTINEL NODE BIOPSY Left 05/28/2022   Procedure: LEFT AXILLARY SENTINEL NODE BIOPSY;  Surgeon: Belinda Cough, MD;  Location: [facility];  Service: General;  Laterality: Left;   BREAST BIOPSY Left 04/30/2022   MM LT BREAST BX W LOC DEV 1ST LESION IMAGE BX SPEC STEREO GUIDE 04/30/2022 [facility]   BREAST BIOPSY  05/27/2022   MM LT RADIOACTIVE SEED LOC MAMMO GUIDE 05/27/2022 [facility]   BREAST LUMPECTOMY WITH RADIOACTIVE SEED AND SENTINEL LYMPH NODE BIOPSY Left 05/28/2022   Procedure: LEFT BREAST LUMPECTOMY WITH RADIOACTIVE  SEED;  Surgeon: Belinda Cough, MD;  Location: [facility];  Service: General;  Laterality: Left;   CESAREAN SECTION  1992   LAPAROSCOPIC CHOLECYSTECTOMY  2005    OB History   No obstetric history on file.      Home Medications    Prior to Admission medications  Medication Sig Start Date End Date Taking? Authorizing Provider  anastrozole  (ARIMIDEX ) 1 MG tablet Take 1 tablet (1 mg total) by mouth daily. 10/23/23  Yes Iruku, Praveena, MD  aspirin EC 81 MG tablet Take 81 mg by mouth daily.   Yes [provider]  benzonatate  (TESSALON ) 100 MG capsule Take 1 capsule (100 mg total) by mouth 3 (three) times daily as needed for cough. 03/05/24  Yes Brandonlee Navis K, MD  Calcium Carb-Cholecalciferol (CALCIUM-VITAMIN D ) 500-200 MG-UNIT tablet Take 1 tablet by mouth daily.   Yes [provider]  Multiple Vitamins-Calcium (ONE-A-DAY WOMENS FORMULA) TABS Take 1 tablet by mouth daily.   Yes [provider]  Multiple Vitamins-Minerals (EMERGEN-C VITAMIN C LITE) PACK Take by mouth.   Yes [provider]  Omega-3 Fatty Acids (FISH OIL) 1000 MG CAPS Take 1,000 mg by mouth daily.   Yes [provider]  VITAJOY BIOTIN GUMMIES PO Take by mouth daily.   Yes [provider]  VITAMIN E PO Take 1 capsule by mouth daily.   Yes [provider]    Family History Family  History  Problem Relation Age of Onset   Prostate cancer Father    Hypertension Brother    Kidney disease Brother    Breast cancer Maternal Aunt 20   Stroke Maternal Grandmother    Colon cancer Neg Hx    Stomach cancer Neg Hx    Esophageal cancer Neg Hx    Rectal cancer Neg Hx     Social History Social History[1]   Allergies   Patient has no known allergies.   Review of Systems Review of Systems  Respiratory:  Positive for cough.      Physical Exam Triage Vital Signs ED Triage Vitals  Encounter Vitals Group     BP 03/05/24 1416 138/81     Girls Systolic BP  Percentile --      Girls Diastolic BP Percentile --      Boys Systolic BP Percentile --      Boys Diastolic BP Percentile --      Pulse Rate 03/05/24 1416 98     Resp 03/05/24 1416 16     Temp 03/05/24 1416 (!) 97.4 F (36.3 C)     Temp Source 03/05/24 1416 Oral     SpO2 03/05/24 1416 99 %     Weight 03/05/24 1418 215 lb (97.5 kg)     Height 03/05/24 1418 5' 6 (1.676 m)     Head Circumference --      Peak Flow --      Pain Score 03/05/24 1418 0     Pain Loc --      Pain Education --      Exclude from Growth Chart --    No data found.  Updated Vital Signs BP 138/81   Pulse 98   Temp (!) 97.4 F (36.3 C) (Oral)   Resp 16   Ht 5' 6 (1.676 m)   Wt 97.5 kg   LMP 11/22/2011   SpO2 99%   BMI 34.70 kg/m   Visual Acuity Right Eye Distance:   Left Eye Distance:   Bilateral Distance:    Right Eye Near:   Left Eye Near:    Bilateral Near:     Physical Exam Vitals reviewed.  Constitutional:      General: She is not in acute distress.    Appearance: She is not toxic-appearing.  HENT:     Right Ear: Tympanic membrane and ear canal normal.     Left Ear: Tympanic membrane and ear canal normal.     Nose: Congestion present.     Mouth/Throat:     Mouth: Mucous membranes are moist.     Pharynx: No oropharyngeal exudate or posterior oropharyngeal erythema.  Eyes:     Extraocular Movements: Extraocular movements intact.     Conjunctiva/sclera: Conjunctivae normal.     Pupils: Pupils are equal, round, and reactive to light.  Cardiovascular:     Rate and Rhythm: Normal rate and regular rhythm.     Heart sounds: No murmur heard. Pulmonary:     Effort: Pulmonary effort is normal. No respiratory distress.     Breath sounds: No stridor. No wheezing, rhonchi or rales.  Musculoskeletal:     Cervical back: Neck supple.  Lymphadenopathy:     Cervical: No cervical adenopathy.  Skin:    Capillary Refill: Capillary refill takes less than 2 seconds.     Coloration: Skin is not  jaundiced or pale.  Neurological:     General: No focal deficit present.     Mental Status: She  is alert and oriented to person, place, and time.  Psychiatric:        Behavior: Behavior normal.      UC Treatments / Results  Labs (all labs ordered are listed, but only abnormal results are displayed) Labs Reviewed  POC SOFIA SARS ANTIGEN FIA    EKG   Radiology No results found.  Procedures Procedures (including critical care time)  Medications Ordered in UC Medications - No data to display  Initial Impression / Assessment and Plan / UC Course  I have reviewed the triage vital signs and the nursing notes.  Pertinent labs & imaging results that were available during my care of the patient were reviewed by me and considered in my medical decision making (see chart for details).      COVID test is negative.  I decided to do a chest x-ray since she does have a history of invasive breast cancer that has been successfully treated in 2024.  While she was waiting for that to be accomplished, she decided to go see her primary care instead.  Chest x-ray orders therefore canceled and she is given her test results and Tessalon  Perles are sent in for the cough. Final Clinical Impressions(s) / UC Diagnoses   Final diagnoses:  Viral URI  Subacute cough     Discharge Instructions      Your COVID test was negative  Please follow-up with your primary care and they can certainly do further testing and x-rays if you are still having trouble then with the cough.  Take benzonatate  100 mg, 1 tab every 8 hours as needed for cough.      ED Prescriptions     Medication Sig Dispense Auth. Provider   benzonatate  (TESSALON ) 100 MG capsule Take 1 capsule (100 mg total) by mouth 3 (three) times daily as needed for cough. 21 capsule Ellinore Merced K, MD      PDMP not reviewed this encounter.     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

## 2024-03-05 NOTE — Discharge Instructions (Addendum)
 Your COVID test was negative  Please follow-up with your primary care and they can certainly do further testing and x-rays if you are still having trouble then with the cough.  Take benzonatate  100 mg, 1 tab every 8 hours as needed for cough.

## 2024-03-05 NOTE — ED Triage Notes (Signed)
 Since thanksgiving ,has had a cough/recovers and then comes back . Is more congested now and wanting to make sure not pneumonia. No fevers

## 2024-03-18 ENCOUNTER — Ambulatory Visit: Admitting: Family Medicine

## 2024-03-29 ENCOUNTER — Ambulatory Visit

## 2024-04-05 ENCOUNTER — Encounter: Admitting: Internal Medicine

## 2024-04-06 ENCOUNTER — Encounter

## 2024-07-09 ENCOUNTER — Inpatient Hospital Stay: Admitting: Hematology and Oncology
